# Patient Record
Sex: Male | Born: 1945 | Race: White | Hispanic: No | Marital: Married | Smoking: Never smoker
Health system: Southern US, Community
[De-identification: ages and names within clinical notes are randomized; demographics above are authoritative.]

## PROBLEM LIST (undated history)

## (undated) DIAGNOSIS — G4730 Sleep apnea, unspecified: Secondary | ICD-10-CM

## (undated) DIAGNOSIS — K219 Gastro-esophageal reflux disease without esophagitis: Secondary | ICD-10-CM

## (undated) DIAGNOSIS — S83206A Unspecified tear of unspecified meniscus, current injury, right knee, initial encounter: Secondary | ICD-10-CM

## (undated) DIAGNOSIS — I4819 Other persistent atrial fibrillation: Secondary | ICD-10-CM

## (undated) DIAGNOSIS — Z8719 Personal history of other diseases of the digestive system: Secondary | ICD-10-CM

## (undated) DIAGNOSIS — N329 Bladder disorder, unspecified: Secondary | ICD-10-CM

## (undated) DIAGNOSIS — J189 Pneumonia, unspecified organism: Secondary | ICD-10-CM

## (undated) DIAGNOSIS — E785 Hyperlipidemia, unspecified: Secondary | ICD-10-CM

## (undated) DIAGNOSIS — L309 Dermatitis, unspecified: Secondary | ICD-10-CM

## (undated) DIAGNOSIS — M1990 Unspecified osteoarthritis, unspecified site: Secondary | ICD-10-CM

## (undated) DIAGNOSIS — N400 Enlarged prostate without lower urinary tract symptoms: Secondary | ICD-10-CM

## (undated) DIAGNOSIS — R0602 Shortness of breath: Secondary | ICD-10-CM

## (undated) DIAGNOSIS — I499 Cardiac arrhythmia, unspecified: Secondary | ICD-10-CM

## (undated) HISTORY — DX: Sleep apnea, unspecified: G47.30

## (undated) HISTORY — DX: Other persistent atrial fibrillation: I48.19

## (undated) HISTORY — DX: Bladder disorder, unspecified: N32.9

## (undated) HISTORY — DX: Gastro-esophageal reflux disease without esophagitis: K21.9

## (undated) HISTORY — DX: Hyperlipidemia, unspecified: E78.5

---

## 1989-02-28 HISTORY — PX: CERVICAL FUSION: SHX112

## 1995-10-18 DIAGNOSIS — K219 Gastro-esophageal reflux disease without esophagitis: Secondary | ICD-10-CM | POA: Insufficient documentation

## 2000-10-18 ENCOUNTER — Encounter: Admission: RE | Admit: 2000-10-18 | Discharge: 2001-01-16 | Payer: Self-pay | Admitting: Family Medicine

## 2007-03-01 HISTORY — PX: OTHER SURGICAL HISTORY: SHX169

## 2007-03-15 ENCOUNTER — Encounter: Admission: RE | Admit: 2007-03-15 | Discharge: 2007-05-02 | Payer: Self-pay | Admitting: Family Medicine

## 2007-07-25 ENCOUNTER — Ambulatory Visit (HOSPITAL_BASED_OUTPATIENT_CLINIC_OR_DEPARTMENT_OTHER): Admission: RE | Admit: 2007-07-25 | Discharge: 2007-07-25 | Payer: Self-pay | Admitting: Orthopedic Surgery

## 2008-04-22 ENCOUNTER — Encounter: Admission: RE | Admit: 2008-04-22 | Discharge: 2008-04-22 | Payer: Self-pay | Admitting: Family Medicine

## 2008-05-07 ENCOUNTER — Encounter: Admission: RE | Admit: 2008-05-07 | Discharge: 2008-06-23 | Payer: Self-pay | Admitting: Sports Medicine

## 2010-03-05 IMAGING — CR DG ANKLE COMPLETE 3+V*R*
3 series · 3 of 3 positions shown · non-contrast
Comparison: Prior ankle radiographs from [REDACTED] at
[facility] dated 04/07/2008

CLINICAL DATA: Fall.  Ankle injury and pain.

RIGHT ANKLE - COMPLETE 3+ VIEW

[view not recorded (1 of 3)]
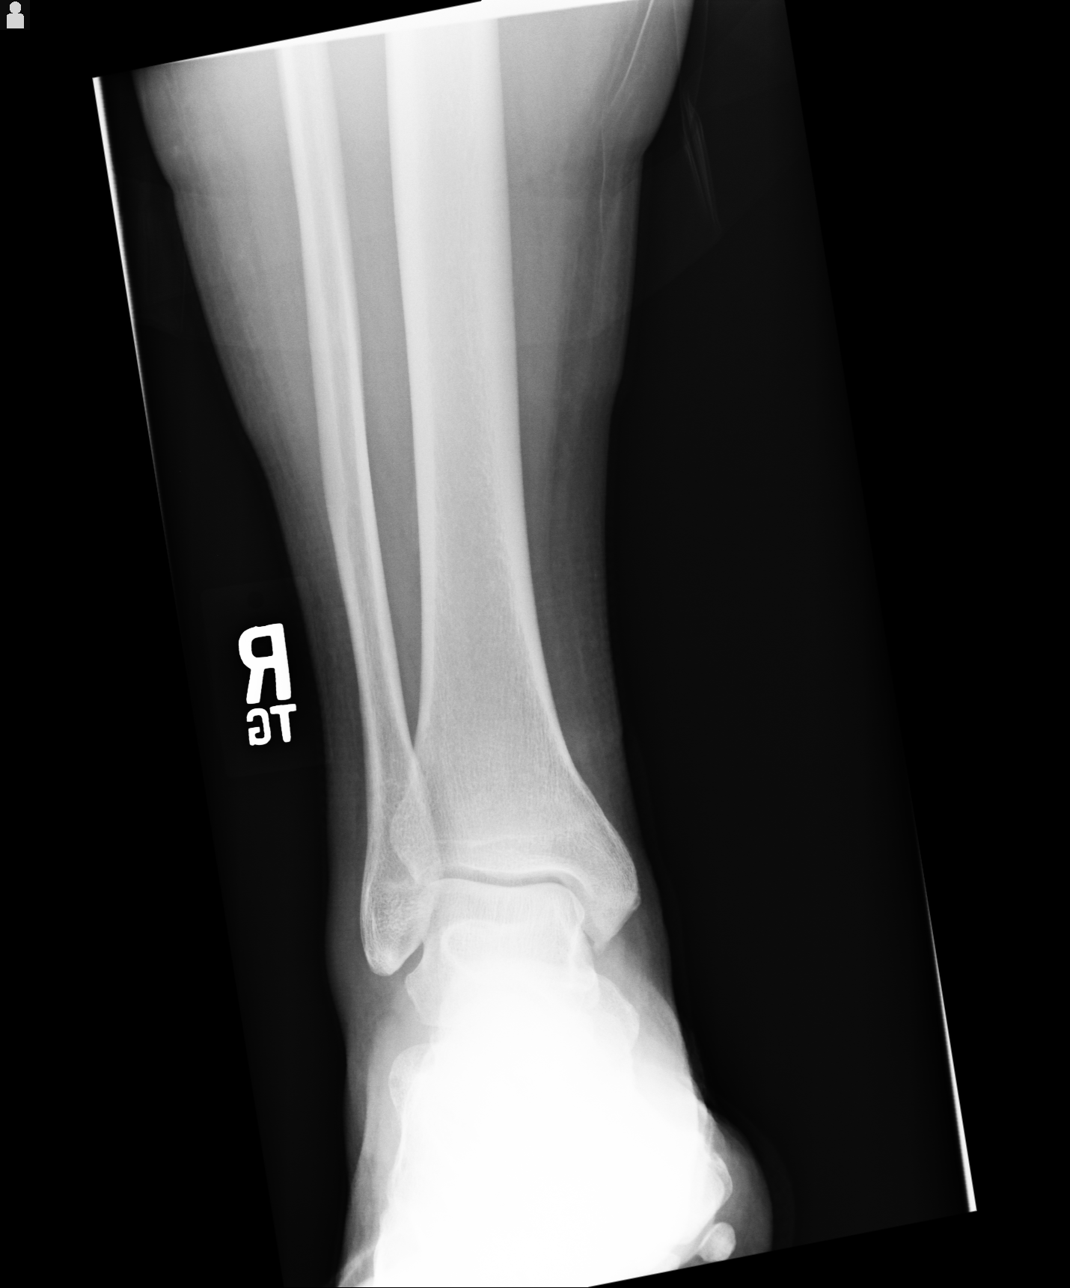

[view not recorded (2 of 3)]
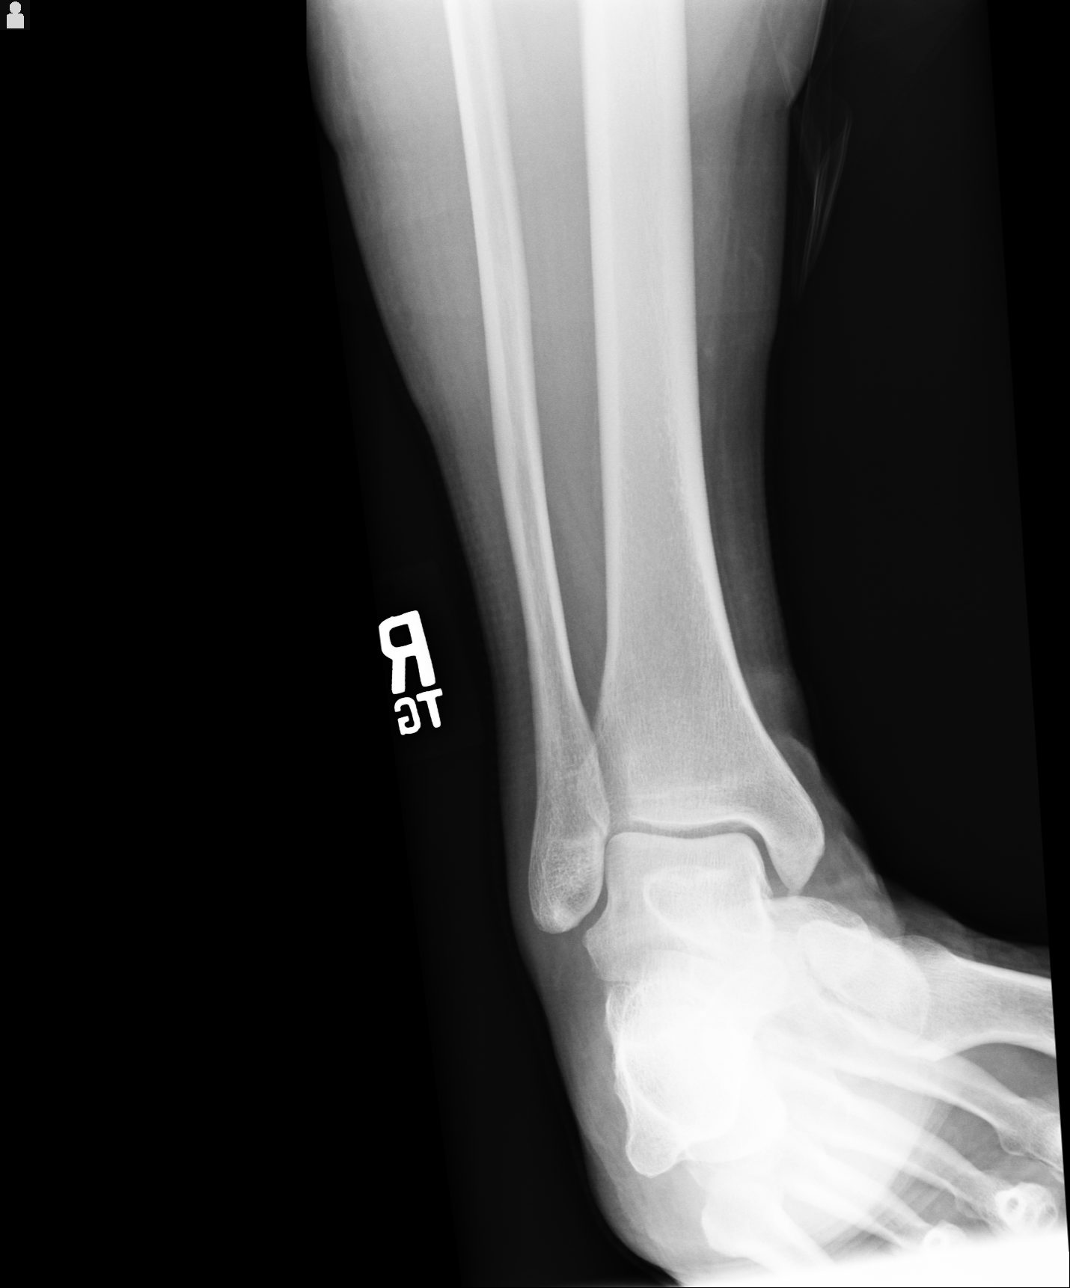

[view not recorded (3 of 3)]
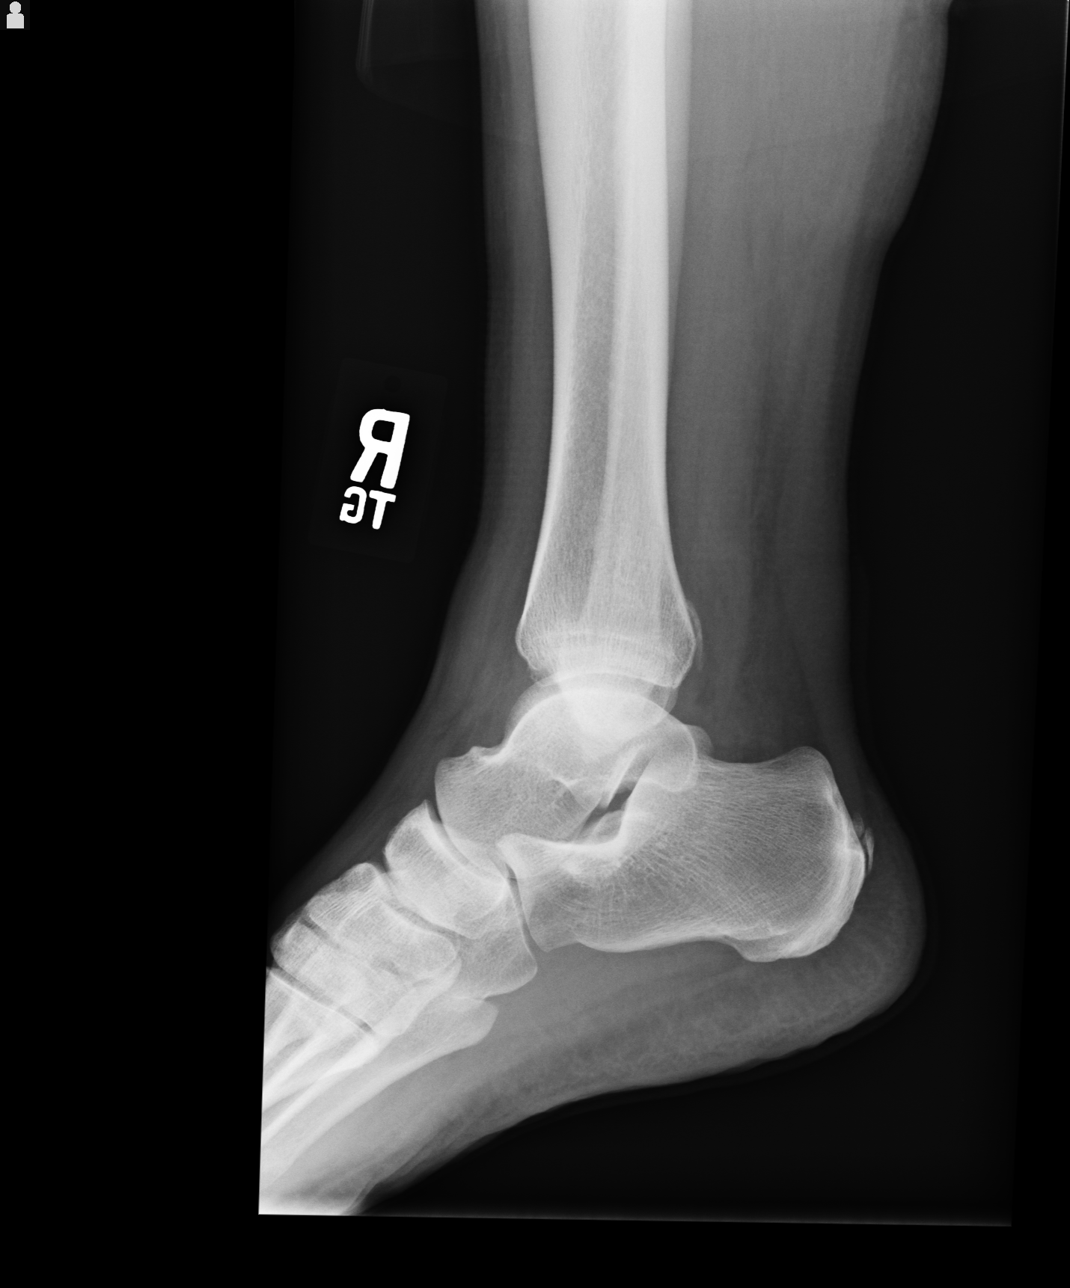

[3 of 3 positions shown; findings below may reference images not displayed]

FINDINGS: A linear radiodensity is seen along the posterior
malleolus of the distal tibia on the lateral projection, which
appears unchanged since prior study.  This is suspicious for
avulsion fracture from the posterior malleolus, but is of
indeterminate age radiographically.

No other ankle fractures are identified.  There is no evidence of
dislocation.  No other significant bone abnormality noted.  Soft
tissues are unremarkable in appearance.
IMPRESSION: Stable appearance of linear radiodensity adjacent to the posterior
malleolus on the lateral projection.  This is suspicious for an
avulsion fracture from the posterior malleolus, although it is of
indeterminate age radiographically.  Recommend clinical correlation
for point tenderness at this site.

## 2010-07-13 NOTE — Op Note (Signed)
[name], [name]             ACCOUNT NO.:  [id number]   MEDICAL RECORD NO.:  [id number]          PATIENT TYPE:  AMB   LOCATION:  [facility]                         FACILITY:  [facility]   PHYSICIAN:  Ollen Gross, M.D.    DATE OF BIRTH:  14-Jul-1945   DATE OF PROCEDURE:  07/25/2007  DATE OF DISCHARGE:                               OPERATIVE REPORT   PREOPERATIVE DIAGNOSIS:  Left knee medial meniscal tear.   POSTOPERATIVE DIAGNOSIS:  Left knee medial meniscal tear.   OPERATION PERFORMED:  Left knee arthroscopy with meniscal debridement.   SURGEON:  Ollen Gross, M.D.   ANESTHESIA:  General.   ESTIMATED BLOOD LOSS:  Minimal.   DRAINS:  None.   COMPLICATIONS:  None.   CONDITION:  Stable to recovery.   BRIEF CLINICAL NOTE:  Brian Myers is a 65 year old male with a several month  history of significant left knee pain and mechanical symptoms.  Exam and  history suggest a medial meniscal tear confirmed by MRI.  There is also  a questionable anterior horn lateral tear.  He presents now for  arthroscopy and debridement.   DESCRIPTION OF PROCEDURE:  After the successful administration of  general anesthetic, a tourniquet was placed high on the left thigh and  left lower extremity prepped and draped in the usual sterile fashion.  Standard superomedial and inferolateral incisions made and inflow  cannula passed superomedial, camera passed inferolateral.  Arthroscopic  visualization proceeds.  Undersurface of the patella and trochlea have  minimal chondromalacia with no significant defects.  The medial and  lateral gutters are visualized.  There are no loose bodies.  Flexion  valgus force was applied to the knee and the medial compartment was  entered.  There is significant undulation in the posterior horn and I  then used a spinal needle to localize the inferomedial portal and a  small incision was made and a dilator placed.  There is a tear on the  undersurface of the medial meniscus  posterior horn.  We debrided this  back to a stable base with baskets and a 4.2 mm shaver and sealed it off  with the ArthroCare.  When I debrided the tear, there was a fair amount  of degeneration in the posterior horn of the medial meniscus.  We got  debridement back to a stable, healthy-looking base.  There were no  chondral defects in the medial compartment.  The intercondylar notch was  visualized.  The ACL was normal.  The lateral compartment was entered.  There were no tears.  There were no chondral defects laterally.  We  again inspected the patellofemoral joint which looks normal.  I then  removed the arthroscopic equipment through the inferior portals which  were closed with interrupted 4-0 nylon.  20 mL of 0.25%  Marcaine with epinephrine were injected through the inflow cannula and  that is removed and that portal closed with nylon.  A bulky sterile  dressing was then applied and he was awakened and transported to  recovery in stable condition.      Ollen Gross, M.D.  Electronically Signed  FA/MEDQ  D:  07/25/2007  T:  07/25/2007  Job:  [id number]

## 2010-11-09 ENCOUNTER — Encounter: Payer: Self-pay | Admitting: Internal Medicine

## 2010-11-10 ENCOUNTER — Ambulatory Visit (INDEPENDENT_AMBULATORY_CARE_PROVIDER_SITE_OTHER): Payer: BC Managed Care – PPO | Admitting: Internal Medicine

## 2010-11-10 ENCOUNTER — Encounter: Payer: Self-pay | Admitting: Internal Medicine

## 2010-11-10 VITALS — BP 128/88 | HR 73 | Ht 71.0 in | Wt 255.0 lb

## 2010-11-10 DIAGNOSIS — E669 Obesity, unspecified: Secondary | ICD-10-CM | POA: Insufficient documentation

## 2010-11-10 DIAGNOSIS — G4730 Sleep apnea, unspecified: Secondary | ICD-10-CM

## 2010-11-10 DIAGNOSIS — Z9989 Dependence on other enabling machines and devices: Secondary | ICD-10-CM | POA: Insufficient documentation

## 2010-11-10 DIAGNOSIS — G4733 Obstructive sleep apnea (adult) (pediatric): Secondary | ICD-10-CM | POA: Insufficient documentation

## 2010-11-10 DIAGNOSIS — I4891 Unspecified atrial fibrillation: Secondary | ICD-10-CM | POA: Insufficient documentation

## 2010-11-10 MED ORDER — FLECAINIDE ACETATE 100 MG PO TABS: 100.0000 mg | ORAL_TABLET | Freq: Two times a day (BID) | ORAL | Status: DC

## 2010-11-10 NOTE — Progress Notes (Signed)
Brian Myers is a pleasant 65 y.o. WF patient with a h/o obesity, OSA on CPAP, and persistent atrial fibrillation who presents today for EP consultation regarding his atrial fibrillation.  He presented to Dr Zachery Dauer office 7/11 for further evaluation of palpitations and was diagnosed with atrial fibrillation.  In retrospect, he has had palpitations for 10 years or so.  He reports initially attempts to capture his palpitations years ago with holter monitoring were unsuccessful due to their infrequent nature.  Eventually his palpitations increased in frequency and duration of atrial fibrillation until the diagnosis was made.  He was subsequently evaluated by Dr Katrinka Blazing who initiated pradaxa and diltiazem.  He has not previously tried an AAD or cardioversion.  At this point, he feels that he is in afib 60% of the time or more.  He reports that episodes lasts up to 20 days at a time.  He reports symptoms of palpitations.  He reports fatigue, irritability, and decreased exercise tolerance during his afib. He is unaware of any triggers or precipitants for his afib. Today, he denies symptoms of chest pain, shortness of breath, orthopnea, PND, lower extremity edema, dizziness, presyncope, syncope, or neurologic sequela. The patient is tolerating medications without difficulties and is otherwise without complaint today.   Past Medical History  Diagnosis Date  . Asthma   . Hyperlipidemia   . Sleep apnea     with cpap  . GERD (gastroesophageal reflux disease)   . Persistent atrial fibrillation     Dr Katrinka Blazing   Past Surgical History  Procedure Date  . Total knee arthroplasty 2004  . Cervical fusion 1990    Current Outpatient Prescriptions  Medication Sig Dispense Refill  . albuterol (PROVENTIL HFA;VENTOLIN HFA) 108 (90 BASE) MCG/ACT inhaler Inhale 2 puffs into the lungs every 6 (six) hours as needed.        . budesonide (PULMICORT) 180 MCG/ACT inhaler Inhale 1 puff into the lungs 2 (two) times daily.         . dabigatran (PRADAXA) 150 MG CAPS Take 150 mg by mouth every 12 (twelve) hours.        Marland Kitchen diltiazem (CARDIZEM CD) 360 MG 24 hr capsule Take 360 mg by mouth daily.        Marland Kitchen esomeprazole (NEXIUM) 40 MG capsule Take 40 mg by mouth daily before breakfast.        . salmeterol (SEREVENT) 50 MCG/DOSE diskus inhaler Inhale 1 puff into the lungs 2 (two) times daily.          Allergies  Allergen Reactions  . Penicillins   . Sulfa Drugs Cross Reactors     History   Social History  . Marital Status: Married    Spouse Name: N/A    Number of Children: 2  . Years of Education: N/A   Occupational History  .  [employer]   Social History Main Topics  . Smoking status: Never Smoker   . Smokeless tobacco: Not on file  . Alcohol Use: 0.0 oz/week     at least 2-3 glasses of wine per day  . Drug Use: Not on file  . Sexually Active: Not on file   Other Topics Concern  . Not on file   Social History Narrative   Lives in [place] with spouse.  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    Family History  Problem Relation Age of Onset  . Arrhythmia Mother     atrial fib  . Hypertension Father   .  Mitral valve prolapse Mother     ROS- All systems are reviewed and negative except as per the HPI above  Physical Exam: Filed Vitals:   11/10/10 1624  BP: 128/88  Pulse: 73  Height: 5\' 11"  (1.803 m)  Weight: 255 lb (115.667 kg)    GEN- The patient is well appearing, alert and oriented x 3 today.   Head- normocephalic, atraumatic Eyes-  Sclera clear, conjunctiva pink Ears- hearing intact Oropharynx- clear Neck- supple, no JVP Lymph- no cervical lymphadenopathy Lungs- Clear to ausculation bilaterally, normal work of breathing Heart- irregular rate and rhythm, no murmurs, rubs or gallops, PMI not laterally displaced GI- soft, NT, ND, + BS Extremities- no clubbing, cyanosis, or edema MS- no significant deformity or atrophy Skin- no rash or lesion Psych- euthymic mood, full  affect Neuro- strength and sensation are intact  EKG today reveals afib V rate 73 bpm, nonspecific ST/T changes, Qtc 438  Assessment and Plan:

## 2010-11-10 NOTE — Assessment & Plan Note (Signed)
Weight loss advised 

## 2010-11-10 NOTE — Assessment & Plan Note (Signed)
He reports compliance with CPAP. 

## 2010-11-10 NOTE — Assessment & Plan Note (Signed)
Brian Myers has moderately symptomatic persistent atrial fibrillation.  He reports previously being told that he also had atrial flutter.   He has not previously tried antiarrhythmic drugs.  He is presently anticoagulated with pradaxa.  Therapeutic strategies for afib including medicine and ablation were discussed in detail with the patient today. At this time, he is not a candidate for catheter ablation as he has not failed antiarrhythmic drugs.  Given prolonged afib, obesity, OSA, and ongoing ETOH use, I suspect that his afib may be quite difficult to control long term. I will ask to Dr Katrinka Blazing to forward the patient's prior echo to me for review to determine his left atrial size.   The importance of lifestyle modification including weight loss, compliance with CPAP, and cessation of ETOH was discussed today.  In addition, I discussed risks and benefits to multaq, flecainide, and tikosyn.  At this time, the patient would like to try flecainide.  I have therefore started flecainide 100mg  BID today.  He will return in 2 weeks.  If he has not converted to sinus, he may require cardioversion.  Once in sinus, we will proceed with GXT myoview.  If he fails medical therapy with flecainide, then we would consider tikosyn.  I would like to achieve and maintain sinus rhythm to allow for atrial remodeling before considering catheter ablation.

## 2010-11-10 NOTE — Patient Instructions (Signed)
Your physician recommends that you schedule a follow-up appointment in: 2 weeks with Dr Johney Frame   Your physician has recommended you make the following change in your medication: start Flecainide 100mg  one tablet twice daily

## 2010-11-22 ENCOUNTER — Ambulatory Visit: Payer: BC Managed Care – PPO | Admitting: Internal Medicine

## 2010-11-24 LAB — POCT HEMOGLOBIN-HEMACUE
Hemoglobin: 15.5
Operator id: 280881

## 2010-12-06 ENCOUNTER — Encounter: Payer: Self-pay | Admitting: Internal Medicine

## 2011-01-13 ENCOUNTER — Encounter: Payer: Self-pay | Admitting: Internal Medicine

## 2011-06-09 ENCOUNTER — Other Ambulatory Visit: Payer: Self-pay | Admitting: Internal Medicine

## 2011-06-09 DIAGNOSIS — I4891 Unspecified atrial fibrillation: Secondary | ICD-10-CM

## 2011-06-09 MED ORDER — FLECAINIDE ACETATE 100 MG PO TABS: 100.0000 mg | ORAL_TABLET | Freq: Two times a day (BID) | ORAL | Status: DC

## 2011-07-06 ENCOUNTER — Other Ambulatory Visit: Payer: Self-pay | Admitting: Internal Medicine

## 2011-07-06 DIAGNOSIS — I4891 Unspecified atrial fibrillation: Secondary | ICD-10-CM

## 2011-07-06 MED ORDER — FLECAINIDE ACETATE 100 MG PO TABS: 100.0000 mg | ORAL_TABLET | Freq: Two times a day (BID) | ORAL | Status: DC

## 2011-08-03 ENCOUNTER — Other Ambulatory Visit: Payer: Self-pay | Admitting: Internal Medicine

## 2011-08-03 DIAGNOSIS — I4891 Unspecified atrial fibrillation: Secondary | ICD-10-CM

## 2011-08-03 MED ORDER — FLECAINIDE ACETATE 100 MG PO TABS: 100.0000 mg | ORAL_TABLET | Freq: Two times a day (BID) | ORAL | Status: DC

## 2013-03-05 ENCOUNTER — Other Ambulatory Visit: Payer: Self-pay | Admitting: Orthopedic Surgery

## 2013-03-20 ENCOUNTER — Telehealth: Payer: Self-pay

## 2013-03-20 NOTE — Telephone Encounter (Signed)
pt adv that Dr.Smith is clearinfg him for surgery.pt is to hold pradaxa 48hrs before surgery.pt is to follow Dr.Aluisio instructions on when to restart.Clearance given to medical records to fax.pt verbalized understanding.

## 2013-03-21 ENCOUNTER — Telehealth: Payer: Self-pay | Admitting: Interventional Cardiology

## 2013-03-21 NOTE — Telephone Encounter (Signed)
Received request from Nurse fax box, documents faxed for surgical clearance. To: [employer] Fax number: [phone] Attention: [phone]/[phone]

## 2013-03-24 ENCOUNTER — Other Ambulatory Visit: Payer: Self-pay | Admitting: *Deleted

## 2013-03-24 DIAGNOSIS — I4891 Unspecified atrial fibrillation: Secondary | ICD-10-CM

## 2013-04-04 ENCOUNTER — Other Ambulatory Visit: Payer: Self-pay | Admitting: Orthopedic Surgery

## 2013-04-10 ENCOUNTER — Telehealth: Payer: Self-pay | Admitting: Interventional Cardiology

## 2013-04-10 NOTE — Telephone Encounter (Signed)
New message  Patient feels that his A-Fib is acting up and he would like to speak with a nurse. Please call and advise.

## 2013-04-10 NOTE — Telephone Encounter (Signed)
Returned call to patient he stated he has had 4 to 5 episodes of atrial fib in the past week.Stated the last episode made him sob and weak.Stated he is scheduled for knee replacement next Wednesday 04/17/13 and would like appointment with Dr.Smith.Stated he is taking flecainide 100 mg twice a day and diltiazem 360 mg daily.Message sent to Dr.Smith's nurse Lattie Haw for a appointment.

## 2013-04-11 NOTE — Telephone Encounter (Signed)
Follow up    Pt has not heard from nurse.  Having knee surgery next wed and is in afib---want nurse to work him in to see Dr Tamala Julian before his surgery.

## 2013-04-12 ENCOUNTER — Encounter (HOSPITAL_COMMUNITY)
Admission: RE | Admit: 2013-04-12 | Discharge: 2013-04-12 | Disposition: A | Discharge status: Home / Self Care | Payer: BC Managed Care – PPO | Source: Ambulatory Visit | Attending: Orthopedic Surgery | Admitting: Orthopedic Surgery

## 2013-04-12 ENCOUNTER — Encounter (HOSPITAL_COMMUNITY): Payer: Self-pay

## 2013-04-12 ENCOUNTER — Encounter (HOSPITAL_COMMUNITY): Payer: BC Managed Care – PPO | Admitting: Pharmacy Technician

## 2013-04-12 DIAGNOSIS — Z01810 Encounter for preprocedural cardiovascular examination: Secondary | ICD-10-CM | POA: Insufficient documentation

## 2013-04-12 DIAGNOSIS — Z01812 Encounter for preprocedural laboratory examination: Secondary | ICD-10-CM | POA: Insufficient documentation

## 2013-04-12 HISTORY — DX: Pneumonia, unspecified organism: J18.9

## 2013-04-12 HISTORY — DX: Personal history of other diseases of the digestive system: Z87.19

## 2013-04-12 HISTORY — DX: Unspecified tear of unspecified meniscus, current injury, right knee, initial encounter: S83.206A

## 2013-04-12 HISTORY — DX: Unspecified osteoarthritis, unspecified site: M19.90

## 2013-04-12 HISTORY — DX: Benign prostatic hyperplasia without lower urinary tract symptoms: N40.0

## 2013-04-12 HISTORY — DX: Dermatitis, unspecified: L30.9

## 2013-04-12 HISTORY — DX: Shortness of breath: R06.02

## 2013-04-12 HISTORY — DX: Cardiac arrhythmia, unspecified: I49.9

## 2013-04-12 LAB — CBC
HCT: 38.2 % — ABNORMAL LOW (ref 39.0–52.0)
Hemoglobin: 12.2 g/dL — ABNORMAL LOW (ref 13.0–17.0)
MCH: 26.8 pg (ref 26.0–34.0)
MCHC: 31.9 g/dL (ref 30.0–36.0)
MCV: 83.8 fL (ref 78.0–100.0)
Platelets: 297 10*3/uL (ref 150–400)
RBC: 4.56 MIL/uL (ref 4.22–5.81)
RDW: 15.9 % — ABNORMAL HIGH (ref 11.5–15.5)
WBC: 8.3 10*3/uL (ref 4.0–10.5)

## 2013-04-12 LAB — BASIC METABOLIC PANEL
BUN: 13 mg/dL (ref 6–23)
CO2: 26 mEq/L (ref 19–32)
Calcium: 9.5 mg/dL (ref 8.4–10.5)
Chloride: 100 mEq/L (ref 96–112)
Creatinine, Ser: 0.97 mg/dL (ref 0.50–1.35)
GFR calc Af Amer: >90 mL/min (ref >90)
GFR calc non Af Amer: 85 mL/min — ABNORMAL LOW (ref >90)
Glucose, Bld: 117 mg/dL — ABNORMAL HIGH (ref 70–99)
Potassium: 3.7 mEq/L (ref 3.7–5.3)
Sodium: 140 mEq/L (ref 137–147)

## 2013-04-12 LAB — PROTIME-INR
INR: 1.04 (ref 0.00–1.49)
Prothrombin Time: 13.4 seconds (ref 11.6–15.2)

## 2013-04-12 LAB — APTT: aPTT: 33 seconds (ref 24–37)

## 2013-04-12 NOTE — Pre-Procedure Instructions (Signed)
CXR REPORT 03/20/13 ON PT'S CHART FROM [facility] AT [facility]. EKG AND CARDIOLOGY OFFICE NOTES 07/10/13 ON PT'S CHART FROM DR. Linard Millers.  PT STATES HE HAS HX ATRIAL FIB AND FLUTTER - AND STATES HIS MEDICATION USUALLY CONTROLS HEART RATE BUT HE HAS BEEN EXPERIENCING EPISODES OF  PALPITATIONS AND BREATHLESSNESS AND HAS LEFT MESSAGE FOR DR. H. [place] DR'S OFFICE TO CALL HIM BACK.  EKG REPEATED TODAY AT [facility] - NS[facility].

## 2013-04-12 NOTE — Patient Instructions (Signed)
   YOUR SURGERY IS SCHEDULED AT [facility]  ON:  Wednesday  2/18  REPORT TO  SHORT STAY CENTER AT:   8:00 AM      PHONE # FOR SHORT STAY IS [phone]  DO NOT EAT OR DRINK ANYTHING AFTER MIDNIGHT THE NIGHT BEFORE YOUR SURGERY.  YOU MAY BRUSH YOUR TEETH, RINSE OUT YOUR MOUTH--BUT NO WATER, NO FOOD, NO CHEWING GUM, NO MINTS, NO CANDIES, NO CHEWING TOBACCO.  PLEASE TAKE THE FOLLOWING MEDICATIONS THE AM OF YOUR SURGERY WITH A FEW SIPS OF WATER:   FLECAINIDE.  USE YOUR SEREVENT, PULMICORT AND ALBUTEROL INHALERS.  BRING YOUR ALBUTEROL INHALER TO HOSPITAL   IF YOU HAVE SLEEP APNEA AND USE CPAP OR BIPAP--PLEASE BRING THE MASK AND THE TUBING.  DO NOT BRING YOUR MACHINE.  DO NOT BRING VALUABLES, MONEY, CREDIT CARDS.  DO NOT WEAR JEWELRY, MAKE-UP, NAIL POLISH AND NO METAL PINS OR CLIPS IN YOUR HAIR. CONTACT LENS, DENTURES / PARTIALS, GLASSES SHOULD NOT BE WORN TO SURGERY AND IN MOST CASES-HEARING AIDS WILL NEED TO BE REMOVED.  BRING YOUR GLASSES CASE, ANY EQUIPMENT NEEDED FOR YOUR CONTACT LENS. FOR PATIENTS ADMITTED TO THE HOSPITAL--CHECK OUT TIME THE DAY OF DISCHARGE IS 11:00 AM.  ALL INPATIENT ROOMS ARE PRIVATE - WITH BATHROOM, TELEPHONE, TELEVISION AND WIFI INTERNET.  IF YOU ARE BEING DISCHARGED THE SAME DAY OF YOUR SURGERY--YOU CAN NOT DRIVE YOURSELF HOME--AND SHOULD NOT GO HOME ALONE BY TAXI OR BUS.  NO DRIVING OR OPERATING MACHINERY, OR MAKING LEGAL DECISIONS FOR 24 HOURS FOLLOWING ANESTHESIA / PAIN MEDICATIONS.  PLEASE MAKE ARRANGEMENTS FOR SOMEONE TO BE WITH YOU AT HOME THE FIRST 24 HOURS AFTER SURGERY. RESPONSIBLE DRIVER'S NAME / PHONE  WIFE    [place] ANY  FACT SHEETS THAT YOU WERE GIVEN:  INCENTIVE SPIROMETER INFORMATION.  FAILURE TO FOLLOW THESE INSTRUCTIONS MAY RESULT IN THE CANCELLATION OF YOUR SURGERY. PLEASE BE AWARE THAT YOU MAY NEED ADDITIONAL BLOOD DRAWN DAY OF YOUR SURGERY  PATIENT  SIGNATURE_________________________________

## 2013-04-12 NOTE — Telephone Encounter (Signed)
Follow up    Pt need to speak with a nurse regarding knee surgery and is in afib--he is supposed to stop prodaxa Monday if Dr Tamala Julian says so

## 2013-04-12 NOTE — Telephone Encounter (Signed)
Spoke with Dr. Julianne Handler (DOD) this patient.  He requests I contact Dr. Tamala Julian directly to discuss.    Spoke with patient.  He had been having symptomatic episodes of afib, noting that they lasted about 10-15 min at a time.  Has felt none for the last 3 days.   However, the last time (3 days ago) he was very SOB for 9-10 hrs with minimal exertion. "like I was before I started flecainide".  He is scheduled to have knee surgery on 2/18 (Wed) and is supposed to hold pradaxa starting Monday. He had pre op EKG today and it appears NSR.  Called Dr. Tamala Julian on cell but he was unable to talk, asked him to pls call back to triage. CB from Dr. Tamala Julian, ok for patient to be off pradaxa starting Monday as scheduled for surgery.   Called patient back again.  Informed of above.

## 2013-04-13 NOTE — Telephone Encounter (Signed)
Past Medical History  Asthma   GERD   Sleep apnea with CPAP (Dr. Brett Fairy)   Hyeprlipidemia   intermittant Afib (Dr. Tamala Julian, Dr. Rayann Heman)   BPH, elevated PSA (Dr. Alinda Money   Only has intermittent AF and brief interruption of anticoagulation will increase of stroke minimally. He should proceed with plan and discontinue pradaxa 48-72 hours prior to the procedure.

## 2013-04-16 NOTE — H&P (Signed)
CC- Brian Myers is a 68 y.o. male who presents with right knee pain.  HPI- . Knee Pain: Patient presents with knee pain involving the  right knee. Onset of the symptoms was several months ago. Inciting event: none known. Current symptoms include giving out, pain located medially, popping sensation, stiffness and swelling. Pain is aggravated by lateral movements, pivoting, rising after sitting, squatting and walking.  Patient has had no prior knee problems. Evaluation to date: MRI: abnormal medial and lateral meniscal tears. Treatment to date: corticosteroid injection which was not very effective.  Past Medical History  Diagnosis Date  . Asthma   . Hyperlipidemia   . Sleep apnea     with cpap  . GERD (gastroesophageal reflux disease)   . Persistent atrial fibrillation     Dr Tamala Julian  . Shortness of breath     WHEN HEART BEAT IRREGULAR  . Pneumonia     LAST EPISODE JAN 2015  . Arthritis     KNEES  . Right knee meniscal tear     PAIN RT KNEE  . BPH (benign prostatic hypertrophy)     DIFFICULTY GETTING STREAM STARTED  . Eczema     HANDS  . Hyperlipidemia     DOES NOT TOLERATE ANY OF THE STATINS  . H/O hiatal hernia   . Dysrhythmia     ATRIAL FIB AND FLUTTER-DR. Linard Millers PT STATES HIS HEART IS USUALLY IN RHYTHM - BUT FOR PAST 10 DAYS OR MORE - HE HAS EXPERIENCED PALPITATIONS AND BREATHLESSNESS - HE HAS CALLED DR. Lemmie Evens SMITH'S OFFICE     Past Surgical History  Procedure Laterality Date  . Cervical fusion  1991  . Left knee arthroscpy  2009    Prior to Admission medications   Medication Sig Start Date End Date Taking? Authorizing Provider  acidophilus (RISAQUAD) CAPS capsule Take 1 capsule by mouth daily.    Historical Provider, MD  albuterol (PROVENTIL HFA;VENTOLIN HFA) 108 (90 BASE) MCG/ACT inhaler Inhale 2 puffs into the lungs every 6 (six) hours as needed for wheezing or shortness of breath.     Historical Provider, MD  augmented betamethasone dipropionate (DIPROLENE-AF)  0.05 % ointment Apply 1 application topically daily. Uses on hands for eczema    Historical Provider, MD  budesonide (PULMICORT) 180 MCG/ACT inhaler Inhale 1 puff into the lungs 2 (two) times daily.     Historical Provider, MD  clobetasol cream (TEMOVATE) 4.00 % Apply 1 application topically 3 (three) times daily as needed (rash).    Historical Provider, MD  dabigatran (PRADAXA) 150 MG CAPS Take 150 mg by mouth every 12 (twelve) hours.     Historical Provider, MD  diltiazem (CARDIZEM CD) 360 MG 24 hr capsule Take 360 mg by mouth every evening.     Historical Provider, MD  esomeprazole (NEXIUM) 40 MG capsule Take 40 mg by mouth every evening.     Historical Provider, MD  flecainide (TAMBOCOR) 100 MG tablet Take 100 mg by mouth 2 (two) times daily.    Historical Provider, MD  methylcellulose (ARTIFICIAL TEARS) 1 % ophthalmic solution Place 1 drop into both eyes 2 (two) times daily as needed (dry eyes).    Historical Provider, MD  salmeterol (SEREVENT) 50 MCG/DOSE diskus inhaler Inhale 1 puff into the lungs 2 (two) times daily.     Historical Provider, MD   Knee Exam antalgic gait, soft tissue tenderness over medial joint line, effusion, negative pivot-shift, collateral ligaments intact  Physical Examination: General appearance - alert, well  appearing, and in no distress Mental status - alert, oriented to person, place, and time Chest - clear to auscultation, no wheezes, rales or rhonchi, symmetric air entry Heart - normal rate, regular rhythm, normal S1, S2, no murmurs, rubs, clicks or gallops Abdomen - soft, nontender, nondistended, no masses or organomegaly Neurological - alert, oriented, normal speech, no focal findings or movement disorder noted   Asessment/Plan--- Right knee medial and lateral meniscal tears- - Plan right knee arthroscopy with meniscal debridement. Procedure risks and potential comps discussed with patient who elects to proceed. Goals are decreased pain and increased function  with a high likelihood of achieving both

## 2013-04-17 ENCOUNTER — Encounter (HOSPITAL_COMMUNITY): Payer: Self-pay | Admitting: *Deleted

## 2013-04-17 ENCOUNTER — Encounter (HOSPITAL_COMMUNITY): Payer: BC Managed Care – PPO | Admitting: Anesthesiology

## 2013-04-17 ENCOUNTER — Ambulatory Visit (HOSPITAL_COMMUNITY)
Admission: RE | Admit: 2013-04-17 | Discharge: 2013-04-17 | Disposition: A | Discharge status: Home / Self Care | Payer: BC Managed Care – PPO | Source: Ambulatory Visit | Attending: Orthopedic Surgery | Admitting: Orthopedic Surgery

## 2013-04-17 ENCOUNTER — Ambulatory Visit (HOSPITAL_COMMUNITY): Payer: BC Managed Care – PPO | Admitting: Anesthesiology

## 2013-04-17 ENCOUNTER — Encounter (HOSPITAL_COMMUNITY)
Admission: RE | Disposition: A | Discharge status: Home / Self Care | Payer: Self-pay | Source: Ambulatory Visit | Attending: Orthopedic Surgery

## 2013-04-17 DIAGNOSIS — G4730 Sleep apnea, unspecified: Secondary | ICD-10-CM | POA: Insufficient documentation

## 2013-04-17 DIAGNOSIS — IMO0002 Reserved for concepts with insufficient information to code with codable children: Secondary | ICD-10-CM | POA: Insufficient documentation

## 2013-04-17 DIAGNOSIS — S83289A Other tear of lateral meniscus, current injury, unspecified knee, initial encounter: Secondary | ICD-10-CM | POA: Insufficient documentation

## 2013-04-17 DIAGNOSIS — X58XXXA Exposure to other specified factors, initial encounter: Secondary | ICD-10-CM | POA: Insufficient documentation

## 2013-04-17 DIAGNOSIS — K219 Gastro-esophageal reflux disease without esophagitis: Secondary | ICD-10-CM | POA: Insufficient documentation

## 2013-04-17 DIAGNOSIS — E785 Hyperlipidemia, unspecified: Secondary | ICD-10-CM | POA: Insufficient documentation

## 2013-04-17 DIAGNOSIS — J45909 Unspecified asthma, uncomplicated: Secondary | ICD-10-CM | POA: Insufficient documentation

## 2013-04-17 DIAGNOSIS — N400 Enlarged prostate without lower urinary tract symptoms: Secondary | ICD-10-CM | POA: Insufficient documentation

## 2013-04-17 DIAGNOSIS — I4891 Unspecified atrial fibrillation: Secondary | ICD-10-CM | POA: Insufficient documentation

## 2013-04-17 DIAGNOSIS — K449 Diaphragmatic hernia without obstruction or gangrene: Secondary | ICD-10-CM | POA: Insufficient documentation

## 2013-04-17 DIAGNOSIS — M2240 Chondromalacia patellae, unspecified knee: Secondary | ICD-10-CM | POA: Insufficient documentation

## 2013-04-17 DIAGNOSIS — Z79899 Other long term (current) drug therapy: Secondary | ICD-10-CM | POA: Insufficient documentation

## 2013-04-17 DIAGNOSIS — M1710 Unilateral primary osteoarthritis, unspecified knee: Secondary | ICD-10-CM | POA: Insufficient documentation

## 2013-04-17 DIAGNOSIS — S83249A Other tear of medial meniscus, current injury, unspecified knee, initial encounter: Secondary | ICD-10-CM | POA: Diagnosis present

## 2013-04-17 DIAGNOSIS — Z981 Arthrodesis status: Secondary | ICD-10-CM | POA: Insufficient documentation

## 2013-04-17 HISTORY — PX: KNEE ARTHROSCOPY: SHX127

## 2013-04-17 SURGERY — ARTHROSCOPY, KNEE
Anesthesia: General | Site: Knee | Laterality: Right

## 2013-04-17 MED ORDER — VANCOMYCIN HCL IN DEXTROSE 1-5 GM/200ML-% IV SOLN
INTRAVENOUS | Status: AC
  Filled 2013-04-17: qty 200

## 2013-04-17 MED ORDER — CHLORHEXIDINE GLUCONATE 4 % EX LIQD: 60.0000 mL | Freq: Once | CUTANEOUS | Status: DC

## 2013-04-17 MED ORDER — PROPOFOL 10 MG/ML IV BOLUS
INTRAVENOUS | Status: AC
  Filled 2013-04-17: qty 20

## 2013-04-17 MED ORDER — SODIUM CHLORIDE 0.9 % IV SOLN: INTRAVENOUS | Status: DC

## 2013-04-17 MED ORDER — VANCOMYCIN HCL IN DEXTROSE 1-5 GM/200ML-% IV SOLN: 1000.0000 mg | Freq: Once | INTRAVENOUS | Status: DC

## 2013-04-17 MED ORDER — PROPOFOL 10 MG/ML IV BOLUS
INTRAVENOUS | Status: DC | PRN
  Administered 2013-04-17: 200 mg via INTRAVENOUS

## 2013-04-17 MED ORDER — BUPIVACAINE-EPINEPHRINE 0.25% -1:200000 IJ SOLN
INTRAMUSCULAR | Status: DC | PRN
  Administered 2013-04-17: 20 mL

## 2013-04-17 MED ORDER — HYDROCODONE-ACETAMINOPHEN 5-325 MG PO TABS: 1.0000 | ORAL_TABLET | Freq: Four times a day (QID) | ORAL | Status: DC | PRN

## 2013-04-17 MED ORDER — FENTANYL CITRATE 0.05 MG/ML IJ SOLN
INTRAMUSCULAR | Status: DC | PRN
  Administered 2013-04-17 (×2): 50 ug via INTRAVENOUS

## 2013-04-17 MED ORDER — LACTATED RINGERS IV SOLN: INTRAVENOUS | Status: DC

## 2013-04-17 MED ORDER — DEXAMETHASONE SODIUM PHOSPHATE 10 MG/ML IJ SOLN
INTRAMUSCULAR | Status: AC
  Filled 2013-04-17: qty 1

## 2013-04-17 MED ORDER — KETOROLAC TROMETHAMINE 30 MG/ML IJ SOLN
INTRAMUSCULAR | Status: AC
  Filled 2013-04-17: qty 1

## 2013-04-17 MED ORDER — PROMETHAZINE HCL 25 MG/ML IJ SOLN: 6.2500 mg | INTRAMUSCULAR | Status: DC | PRN

## 2013-04-17 MED ORDER — MIDAZOLAM HCL 5 MG/5ML IJ SOLN
INTRAMUSCULAR | Status: DC | PRN
  Administered 2013-04-17: 2 mg via INTRAVENOUS

## 2013-04-17 MED ORDER — METHOCARBAMOL 500 MG PO TABS: 500.0000 mg | ORAL_TABLET | Freq: Four times a day (QID) | ORAL | Status: DC

## 2013-04-17 MED ORDER — ONDANSETRON HCL 4 MG/2ML IJ SOLN
INTRAMUSCULAR | Status: AC
  Filled 2013-04-17: qty 2

## 2013-04-17 MED ORDER — DEXAMETHASONE SODIUM PHOSPHATE 10 MG/ML IJ SOLN
10.0000 mg | Freq: Once | INTRAMUSCULAR | Status: AC
  Administered 2013-04-17: 10 mg via INTRAVENOUS

## 2013-04-17 MED ORDER — ONDANSETRON HCL 4 MG/2ML IJ SOLN
INTRAMUSCULAR | Status: DC | PRN
  Administered 2013-04-17: 4 mg via INTRAVENOUS

## 2013-04-17 MED ORDER — LACTATED RINGERS IR SOLN
Status: DC | PRN
  Administered 2013-04-17: 8000 mL

## 2013-04-17 MED ORDER — MIDAZOLAM HCL 2 MG/2ML IJ SOLN
INTRAMUSCULAR | Status: AC
  Filled 2013-04-17: qty 2

## 2013-04-17 MED ORDER — FENTANYL CITRATE 0.05 MG/ML IJ SOLN: 25.0000 ug | INTRAMUSCULAR | Status: DC | PRN

## 2013-04-17 MED ORDER — LIDOCAINE HCL (CARDIAC) 20 MG/ML IV SOLN
INTRAVENOUS | Status: DC | PRN
  Administered 2013-04-17: 50 mg via INTRAVENOUS

## 2013-04-17 MED ORDER — FENTANYL CITRATE 0.05 MG/ML IJ SOLN
INTRAMUSCULAR | Status: AC
  Filled 2013-04-17: qty 2

## 2013-04-17 MED ORDER — BUPIVACAINE-EPINEPHRINE 0.25% -1:200000 IJ SOLN
INTRAMUSCULAR | Status: AC
  Filled 2013-04-17: qty 1

## 2013-04-17 MED ORDER — VANCOMYCIN HCL IN DEXTROSE 1-5 GM/200ML-% IV SOLN
1000.0000 mg | INTRAVENOUS | Status: AC
  Administered 2013-04-17: 1000 mg via INTRAVENOUS

## 2013-04-17 MED ORDER — KETOROLAC TROMETHAMINE 30 MG/ML IJ SOLN
15.0000 mg | Freq: Once | INTRAMUSCULAR | Status: AC | PRN
  Administered 2013-04-17: 30 mg via INTRAVENOUS

## 2013-04-17 MED ORDER — LIDOCAINE HCL (CARDIAC) 20 MG/ML IV SOLN
INTRAVENOUS | Status: AC
  Filled 2013-04-17: qty 5

## 2013-04-17 MED ORDER — ACETAMINOPHEN 10 MG/ML IV SOLN
1000.0000 mg | Freq: Once | INTRAVENOUS | Status: AC
  Administered 2013-04-17: 1000 mg via INTRAVENOUS
  Filled 2013-04-17: qty 100

## 2013-04-17 MED ORDER — LACTATED RINGERS IV SOLN
INTRAVENOUS | Status: DC | PRN
  Administered 2013-04-17: 09:00:00 via INTRAVENOUS

## 2013-04-17 SURGICAL SUPPLY — 25 items
BLADE 4.2CUDA (BLADE) ×2 IMPLANT
BNDG CMPR MED 10X6 ELC LF (GAUZE/BANDAGES/DRESSINGS) ×1
BNDG ELASTIC 6X10 VLCR STRL LF (GAUZE/BANDAGES/DRESSINGS) ×1 IMPLANT
CLOTH BEACON ORANGE TIMEOUT ST (SAFETY) ×2 IMPLANT
COUNTER NEEDLE 20 DBL MAG RED (NEEDLE) ×1 IMPLANT
CUFF TOURN SGL QUICK 34 (TOURNIQUET CUFF) ×2
CUFF TRNQT CYL 34X4X40X1 (TOURNIQUET CUFF) ×1 IMPLANT
DRAPE U-SHAPE 47X51 STRL (DRAPES) ×2 IMPLANT
DRSG EMULSION OIL 3X3 NADH (GAUZE/BANDAGES/DRESSINGS) ×2 IMPLANT
DURAPREP 26ML APPLICATOR (WOUND CARE) ×2 IMPLANT
GLOVE BIO SURGEON STRL SZ8 (GLOVE) ×2 IMPLANT
GLOVE BIOGEL PI IND STRL 8 (GLOVE) ×1 IMPLANT
GLOVE BIOGEL PI INDICATOR 8 (GLOVE) ×1
GOWN STRL REUS W/TWL LRG LVL3 (GOWN DISPOSABLE) ×2 IMPLANT
MANIFOLD NEPTUNE II (INSTRUMENTS) ×2 IMPLANT
PACK ARTHROSCOPY WL (CUSTOM PROCEDURE TRAY) ×2 IMPLANT
PACK ICE MAXI GEL EZY WRAP (MISCELLANEOUS) ×6 IMPLANT
PADDING CAST COTTON 6X4 STRL (CAST SUPPLIES) ×3 IMPLANT
POSITIONER SURGICAL ARM (MISCELLANEOUS) ×2 IMPLANT
SET ARTHROSCOPY TUBING (MISCELLANEOUS) ×2
SET ARTHROSCOPY TUBING LN (MISCELLANEOUS) ×1 IMPLANT
SUT ETHILON 4 0 PS 2 18 (SUTURE) ×2 IMPLANT
TOWEL OR 17X26 10 PK STRL BLUE (TOWEL DISPOSABLE) ×2 IMPLANT
WAND 90 DEG TURBOVAC W/CORD (SURGICAL WAND) IMPLANT
WRAP KNEE MAXI GEL POST OP (GAUZE/BANDAGES/DRESSINGS) ×2 IMPLANT

## 2013-04-17 NOTE — Discharge Instructions (Signed)

## 2013-04-17 NOTE — Transfer of Care (Signed)
Immediate Anesthesia Transfer of Care Note  Patient: Brian Myers  Procedure(s) Performed: Procedure(s) (LRB): ARTHROSCOPY RIGHT KNEE WITH MEDIAL AND LATERAL DEBRIDEMENT AND CONDRAPLASTY (Right)  Patient Location: PACU  Anesthesia Type: General  Level of Consciousness: sedated, patient cooperative and responds to stimulation  Airway & Oxygen Therapy: Patient Spontanous Breathing and Patient connected to face mask oxgen  Post-op Assessment: Report given to PACU RN and Post -op Vital signs reviewed and stable  Post vital signs: Reviewed and stable  Complications: No apparent anesthesia complications

## 2013-04-17 NOTE — Interval H&P Note (Signed)
History and Physical Interval Note:  04/17/2013 10:21 AM  Brian Myers  has presented today for surgery, with the diagnosis of Right Knee Medial Mensical Tear  The various methods of treatment have been discussed with the patient and family. After consideration of risks, benefits and other options for treatment, the patient has consented to  Procedure(s): ARTHROSCOPY RIGHT KNEE WITH DEBRIDEMENT (Right) as a surgical intervention .  The patient's history has been reviewed, patient examined, no change in status, stable for surgery.  I have reviewed the patient's chart and labs.  Questions were answered to the patient's satisfaction.     Gearlean Alf

## 2013-04-17 NOTE — Preoperative (Signed)
Beta Blockers   Reason not to administer Beta Blockers:Not Applicable 

## 2013-04-17 NOTE — Anesthesia Preprocedure Evaluation (Signed)
Anesthesia Evaluation  Patient identified by MRN, date of birth, ID band Patient awake    Reviewed: Allergy & Precautions, H&P , NPO status , Patient's Chart, lab work & pertinent test results  Airway Mallampati: III TM Distance: <3 FB Neck ROM: Full    Dental no notable dental hx.    Pulmonary sleep apnea and Continuous Positive Airway Pressure Ventilation ,  breath sounds clear to auscultation  + decreased breath sounds      Cardiovascular negative cardio ROS  + dysrhythmias Atrial Fibrillation Rhythm:Regular Rate:Normal     Neuro/Psych negative neurological ROS  negative psych ROS   GI/Hepatic negative GI ROS, Neg liver ROS,   Endo/Other  Morbid obesity  Renal/GU negative Renal ROS  negative genitourinary   Musculoskeletal negative musculoskeletal ROS (+)   Abdominal   Peds negative pediatric ROS (+)  Hematology negative hematology ROS (+)   Anesthesia Other Findings   Reproductive/Obstetrics negative OB ROS                           Anesthesia Physical Anesthesia Plan  ASA: III  Anesthesia Plan: General   Post-op Pain Management:    Induction: Intravenous  Airway Management Planned: LMA  Additional Equipment:   Intra-op Plan:   Post-operative Plan: Extubation in OR  Informed Consent: I have reviewed the patients History and Physical, chart, labs and discussed the procedure including the risks, benefits and alternatives for the proposed anesthesia with the patient or authorized representative who has indicated his/her understanding and acceptance.   Dental advisory given  Plan Discussed with: CRNA and Surgeon  Anesthesia Plan Comments:         Anesthesia Quick Evaluation

## 2013-04-17 NOTE — Op Note (Signed)
Preoperative diagnosis-  Right knee medial meniscal tear  Postoperative diagnosis Right- knee medial and lateral meniscal tears   plus Right medial femoral chondral defect  Procedure- Right knee arthroscopy with medial and lateral meniscal debridement and chondroplasty   Surgeon- Dione Plover. Catherine Oak, MD  Anesthesia-General  EBL-  minimal Complications- None  Condition- PACU - hemodynamically stable.  Brief clinical note- -Brian Myers is a 68 y.o.  male with a several month history of right knee pain and mechanical symptoms. Exam and history suggested medial meniscal tear confirmed by MRI. The patient presents now for arthroscopy and debridement   Procedure in detail -       After successful administration of General anesthetic, a tourmiquet is placed high on the Right  thigh and the Right lower extremity is prepped and draped in the usual sterile fashion. Time out is performed by the surgical team. Standard superomedial and inferolateral portal sites are marked and incisions made with an 11 blade. The inflow cannula is passed through the superomedial portal and camera through the inferolateral portal and inflow is initiated. Arthroscopic visualization proceeds.      The undersurface of the patella and trochlea are visualized and there is mild chondromalacia. The medial and lateral gutters are visualized and there are  no loose bodies. Flexion and valgus force is applied to the knee and the medial compartment is entered. A spinal needle is passed into the joint through the site marked for the inferomedial portal. A small incision is made and the dilator passed into the joint. The findings for the medial compartment are degenerative tear body and posterior horn medial meniscus and 1 x 2 cm chondral defect medial femoral condyle . The tear is debrided to a stable base with baskets and a shaver and sealed off with the Arthrocare. The shaver is used to debride the unstable cartilage to a stable  bony base with stable edges. It is probed and found to be stable.    The intercondylar notch is visualized and the ACL appears normal. The lateral compartment is entered and the findings are tear of body and anterior horn lateral meniscus . The tear is debrided to a stable base with baskets and a shaver and sealed off with the Arthrocare. There are no chondral defects in the lateral compartment.     The joint is again inspected and there are no other tears, defects or loose bodies identified. The arthroscopic equipment is then removed from the inferior portals which are closed with interrupted 4-0 nylon. 20 ml of .25% Marcaine with epinephrine are injected through the inflow cannula and the cannula is then removed and the portal closed with nylon. The incisions are cleaned and dried and a bulky sterile dressing is applied. The patient is then awakened and transported to recovery in stable condition.   04/17/2013, 11:21 AM

## 2013-04-17 NOTE — Anesthesia Postprocedure Evaluation (Signed)
  Anesthesia Post-op Note  Patient: Brian Myers  Procedure(s) Performed: Procedure(s) (LRB): ARTHROSCOPY RIGHT KNEE WITH MEDIAL AND LATERAL DEBRIDEMENT AND CONDRAPLASTY (Right)  Patient Location: PACU  Anesthesia Type: General  Level of Consciousness: awake and alert   Airway and Oxygen Therapy: Patient Spontanous Breathing  Post-op Pain: mild  Post-op Assessment: Post-op Vital signs reviewed, Patient's Cardiovascular Status Stable, Respiratory Function Stable, Patent Airway and No signs of Nausea or vomiting  Last Vitals:  Filed Vitals:   04/17/13 1129  BP: 125/71  Pulse:   Temp: 36.7 C  Resp: 12    Post-op Vital Signs: stable   Complications: No apparent anesthesia complications

## 2013-04-18 ENCOUNTER — Encounter (HOSPITAL_COMMUNITY): Payer: Self-pay | Admitting: Orthopedic Surgery

## 2013-05-02 ENCOUNTER — Other Ambulatory Visit: Payer: BC Managed Care – PPO

## 2013-05-24 ENCOUNTER — Encounter: Payer: Self-pay | Admitting: Interventional Cardiology

## 2013-05-31 ENCOUNTER — Telehealth: Payer: Self-pay | Admitting: Interventional Cardiology

## 2013-05-31 NOTE — Telephone Encounter (Signed)
New message     Refill prodaxa 150mg   #60 bid------pt has not seen Dr Tamala Julian at the new office

## 2013-05-31 NOTE — Telephone Encounter (Signed)
[facility] called for refills on Pradaxa 150 mg one tablet by mouth every 12 hours dispense 60 tablet and 2 refills.Pt has a yearly F/U appointment with Dr. Tamala Julian on May 2015.

## 2013-06-07 ENCOUNTER — Other Ambulatory Visit (INDEPENDENT_AMBULATORY_CARE_PROVIDER_SITE_OTHER): Payer: BC Managed Care – PPO

## 2013-06-07 DIAGNOSIS — I4891 Unspecified atrial fibrillation: Secondary | ICD-10-CM

## 2013-06-07 LAB — RENAL FUNCTION PANEL
Albumin: 3.5 g/dL (ref 3.5–5.2)
BUN: 12 mg/dL (ref 6–23)
CO2: 25 mEq/L (ref 19–32)
Calcium: 9.1 mg/dL (ref 8.4–10.5)
Chloride: 105 mEq/L (ref 96–112)
Creatinine, Ser: 0.9 mg/dL (ref 0.4–1.5)
GFR: 92.16 mL/min (ref >60.00)
Glucose, Bld: 144 mg/dL — ABNORMAL HIGH (ref 70–99)
Phosphorus: 2.9 mg/dL (ref 2.3–4.6)
Potassium: 3.1 mEq/L — ABNORMAL LOW (ref 3.5–5.1)
Sodium: 138 mEq/L (ref 135–145)

## 2013-06-07 LAB — HEMOGLOBIN AND HEMATOCRIT, BLOOD
HCT: 35.9 % — ABNORMAL LOW (ref 39.0–52.0)
Hemoglobin: 11.4 g/dL — ABNORMAL LOW (ref 13.0–17.0)

## 2013-06-11 ENCOUNTER — Telehealth: Payer: Self-pay

## 2013-06-11 DIAGNOSIS — I4891 Unspecified atrial fibrillation: Secondary | ICD-10-CM

## 2013-06-11 MED ORDER — POTASSIUM CHLORIDE CRYS ER 20 MEQ PO TBCR: 20.0000 meq | EXTENDED_RELEASE_TABLET | Freq: Every day | ORAL | Status: DC

## 2013-06-11 NOTE — Telephone Encounter (Signed)
Pt given lab results and Dr.Smith instructions .Potassium is low and slight drop in hemoglobin. K-dur 20 meq BID x 3 days. Repeat potassium and hgb in 1 month.lab appt made for 07/12/13.pt aware and verbalized understanding.

## 2013-06-11 NOTE — Telephone Encounter (Signed)
Message copied by Lamar Laundry on Tue Jun 11, 2013  4:15 PM ------      Message from: Daneen Schick      Created: Sun Jun 09, 2013  2:22 PM       Potassium is low and slight drop in hemoglobin. K-dur 20 meq BID x 3 days. Repeat potassium and hgb in 1 month. ------

## 2013-07-10 ENCOUNTER — Ambulatory Visit: Payer: BC Managed Care – PPO | Admitting: Interventional Cardiology

## 2013-07-12 ENCOUNTER — Other Ambulatory Visit (INDEPENDENT_AMBULATORY_CARE_PROVIDER_SITE_OTHER): Payer: BC Managed Care – PPO

## 2013-07-12 DIAGNOSIS — I4891 Unspecified atrial fibrillation: Secondary | ICD-10-CM

## 2013-07-12 LAB — BASIC METABOLIC PANEL
BUN: 12 mg/dL (ref 6–23)
CO2: 26 mEq/L (ref 19–32)
Calcium: 8.7 mg/dL (ref 8.4–10.5)
Chloride: 104 mEq/L (ref 96–112)
Creatinine, Ser: 0.9 mg/dL (ref 0.4–1.5)
GFR: 86.44 mL/min (ref >60.00)
Glucose, Bld: 92 mg/dL (ref 70–99)
Potassium: 3.6 mEq/L (ref 3.5–5.1)
Sodium: 139 mEq/L (ref 135–145)

## 2013-07-12 LAB — HEMOGLOBIN: Hemoglobin: 11.6 g/dL — ABNORMAL LOW (ref 13.0–17.0)

## 2013-07-15 ENCOUNTER — Encounter: Payer: Self-pay | Admitting: Interventional Cardiology

## 2013-07-15 ENCOUNTER — Ambulatory Visit (INDEPENDENT_AMBULATORY_CARE_PROVIDER_SITE_OTHER): Payer: BC Managed Care – PPO | Admitting: Interventional Cardiology

## 2013-07-15 VITALS — BP 140/78 | HR 66 | Ht 71.0 in | Wt 266.0 lb

## 2013-07-15 DIAGNOSIS — I4891 Unspecified atrial fibrillation: Secondary | ICD-10-CM

## 2013-07-15 DIAGNOSIS — Z7901 Long term (current) use of anticoagulants: Secondary | ICD-10-CM

## 2013-07-15 DIAGNOSIS — E875 Hyperkalemia: Secondary | ICD-10-CM

## 2013-07-15 DIAGNOSIS — E785 Hyperlipidemia, unspecified: Secondary | ICD-10-CM

## 2013-07-15 DIAGNOSIS — Z5181 Encounter for therapeutic drug level monitoring: Secondary | ICD-10-CM

## 2013-07-15 DIAGNOSIS — I10 Essential (primary) hypertension: Secondary | ICD-10-CM | POA: Insufficient documentation

## 2013-07-15 DIAGNOSIS — Z79899 Other long term (current) drug therapy: Secondary | ICD-10-CM

## 2013-07-15 MED ORDER — DILTIAZEM HCL ER COATED BEADS 360 MG PO CP24: 360.0000 mg | ORAL_CAPSULE | Freq: Every evening | ORAL | Status: DC

## 2013-07-15 MED ORDER — FLECAINIDE ACETATE 100 MG PO TABS: 100.0000 mg | ORAL_TABLET | Freq: Two times a day (BID) | ORAL | Status: DC

## 2013-07-15 MED ORDER — DABIGATRAN ETEXILATE MESYLATE 150 MG PO CAPS: 150.0000 mg | ORAL_CAPSULE | Freq: Two times a day (BID) | ORAL | Status: DC

## 2013-07-15 NOTE — Progress Notes (Signed)
Patient ID: Brian Myers, male   DOB: 12-01-1945, 68 y.o.   MRN: [id number]    [address] [place], [place]  [address] Phone: [phone] Fax:  [phone]  Date:  07/15/2013   ID:  Brian Myers, DOB 1945-04-24, MRN [id number]  PCP:  Gerrit Heck, MD   ASSESSMENT:  1. Paroxysmal atrial fibrillation, on flecainide therapy for suppression 2. Flecainide therapy without complications 3. History of hypokalemia with recent potassium of 3.7 on no therapy  PLAN:  1. Natural supplementation of potassium 2. Continue flecainide at the current dose. 3. Consider monitoring if frequent episodes of palpitation   SUBJECTIVE: Brian Myers is a 68 y.o. male who has history of paroxysmal atrial fibrillation who for the last year or so has been managed with flecainide at 100 mg twice daily. He feels he has intermittent episodes of unexplained weakness and palpitations. These can last anywhere from minutes to an hour or so. The chest pain. He denies syncope. No episodes of atrial fibrillation noted while hospitalized with right knee replacement earlier this year.   Wt Readings from Last 3 Encounters:  07/15/13 266 lb (120.657 kg)  04/12/13 263 lb (119.296 kg)  11/10/10 255 lb (115.667 kg)     Past Medical History  Diagnosis Date  . Asthma   . Hyperlipidemia   . Sleep apnea     with cpap  . GERD (gastroesophageal reflux disease)   . Persistent atrial fibrillation     Dr Tamala Julian  . Shortness of breath     WHEN HEART BEAT IRREGULAR  . Pneumonia     LAST EPISODE JAN 2015  . Arthritis     KNEES  . Right knee meniscal tear     PAIN RT KNEE  . BPH (benign prostatic hypertrophy)     DIFFICULTY GETTING STREAM STARTED  . Eczema     HANDS  . Hyperlipidemia     DOES NOT TOLERATE ANY OF THE STATINS  . H/O hiatal hernia   . Dysrhythmia     ATRIAL FIB AND FLUTTER-DR. Linard Millers PT STATES HIS HEART IS USUALLY IN RHYTHM - BUT FOR PAST 10 DAYS OR MORE - HE HAS  EXPERIENCED PALPITATIONS AND BREATHLESSNESS - HE HAS CALLED DR. Lemmie Evens [facility] OFFICE     Current Outpatient Prescriptions  Medication Sig Dispense Refill  . acidophilus (RISAQUAD) CAPS capsule Take 1 capsule by mouth daily.      Marland Kitchen albuterol (PROVENTIL HFA;VENTOLIN HFA) 108 (90 BASE) MCG/ACT inhaler Inhale 2 puffs into the lungs every 6 (six) hours as needed for wheezing or shortness of breath.       Marland Kitchen augmented betamethasone dipropionate (DIPROLENE-AF) 0.05 % ointment Apply 1 application topically daily. Uses on hands for eczema      . budesonide (PULMICORT) 180 MCG/ACT inhaler Inhale 1 puff into the lungs 2 (two) times daily.       . clobetasol cream (TEMOVATE) 8.56 % Apply 1 application topically 3 (three) times daily as needed (rash).      . dabigatran (PRADAXA) 150 MG CAPS capsule Take 1 capsule (150 mg total) by mouth every 12 (twelve) hours.  60 capsule  11  . diltiazem (CARDIZEM CD) 360 MG 24 hr capsule Take 1 capsule (360 mg total) by mouth every evening.  30 capsule  11  . esomeprazole (NEXIUM) 40 MG capsule Take 40 mg by mouth every evening.       . flecainide (TAMBOCOR) 100 MG tablet Take 1 tablet (  100 mg total) by mouth 2 (two) times daily.  30 tablet  11  . methylcellulose (ARTIFICIAL TEARS) 1 % ophthalmic solution Place 1 drop into both eyes 2 (two) times daily as needed (dry eyes).      . salmeterol (SEREVENT) 50 MCG/DOSE diskus inhaler Inhale 1 puff into the lungs 2 (two) times daily.        No current facility-administered medications for this visit.    Allergies:    Allergies  Allergen Reactions  . Penicillins Anaphylaxis  . Sulfa Drugs Cross Reactors Anaphylaxis  . Statins     INTOLERANT TO STATINS - SEVERE MUSCLE CRAMPS    Social History:  The patient  reports that he has never smoked. He has never used smokeless tobacco. He reports that he drinks alcohol. He reports that he does not use illicit drugs.   ROS:  Please see the history of present illness.   No  neurological complaints. Denies syncope.   All other systems reviewed and negative.   OBJECTIVE: VS:  BP 140/78  Pulse 66  Ht 5\' 11"  (1.803 m)  Wt 266 lb (120.657 kg)  BMI 37.12 kg/m2 Well nourished, well developed, in no acute distress, obese HEENT: normal Neck: JVD flat. Carotid bruit absent  Cardiac:  normal S1, S2; RRR; no murmur Lungs:  clear to auscultation bilaterally, no wheezing, rhonchi or rales Abd: soft, nontender, no hepatomegaly Ext: Edema absent. Pulses 2+ bilateral Skin: warm and dry Neuro:  CNs 2-12 intact, no focal abnormalities noted  EKG:  Not repeated       Signed, Illene Labrador III, MD 07/15/2013 3:20 PM

## 2013-07-15 NOTE — Patient Instructions (Addendum)
Your physician recommends that you continue on your current medications as directed. Please refer to the Current Medication list given to you today.  Your physician recommends that you return for lab work in: 6 months ( Potassium, Creatinine, Hemoglobin)  Your physician wants you to follow-up in: 6 months You will receive a reminder letter in the mail two months in advance. If you don't receive a letter, please call our office to schedule the follow-up appointment.

## 2013-07-17 ENCOUNTER — Telehealth: Payer: Self-pay

## 2013-07-17 NOTE — Telephone Encounter (Signed)
Message copied by Lamar Laundry on Wed Jul 17, 2013  8:09 AM ------      Message from: Daneen Schick      Created: Tue Jul 16, 2013  7:32 PM       Normal ------

## 2013-07-17 NOTE — Telephone Encounter (Signed)
pt given lab results.  Normal.pt verbalized understanding. 

## 2013-10-03 ENCOUNTER — Other Ambulatory Visit: Payer: Self-pay | Admitting: *Deleted

## 2013-10-03 MED ORDER — FLECAINIDE ACETATE 100 MG PO TABS: 100.0000 mg | ORAL_TABLET | Freq: Two times a day (BID) | ORAL | Status: DC

## 2014-01-13 ENCOUNTER — Ambulatory Visit (INDEPENDENT_AMBULATORY_CARE_PROVIDER_SITE_OTHER): Payer: BC Managed Care – PPO | Admitting: Interventional Cardiology

## 2014-01-13 ENCOUNTER — Encounter: Payer: Self-pay | Admitting: Interventional Cardiology

## 2014-01-13 VITALS — BP 146/82 | HR 61 | Ht 71.0 in | Wt 268.0 lb

## 2014-01-13 DIAGNOSIS — I10 Essential (primary) hypertension: Secondary | ICD-10-CM

## 2014-01-13 DIAGNOSIS — I480 Paroxysmal atrial fibrillation: Secondary | ICD-10-CM

## 2014-01-13 DIAGNOSIS — Z7901 Long term (current) use of anticoagulants: Secondary | ICD-10-CM

## 2014-01-13 NOTE — Patient Instructions (Signed)
Your physician recommends that you continue on your current medications as directed. Please refer to the Current Medication list given to you today.  Your physician has recommended that you wear a holter monitor. Holter monitors are medical devices that record the heart's electrical activity. Doctors most often use these monitors to diagnose arrhythmias. Arrhythmias are problems with the speed or rhythm of the heartbeat. The monitor is a small, portable device. You can wear one while you do your normal daily activities. This is usually used to diagnose what is causing palpitations/syncope (passing out).  Your physician wants you to follow-up in: 6 months You will receive a reminder letter in the mail two months in advance. If you don't receive a letter, please call our office to schedule the follow-up appointment.

## 2014-01-13 NOTE — Progress Notes (Signed)
Patient ID: Brian Myers, male   DOB: 11/05/1945, 68 y.o.   MRN: [id number]    [address] [place], [place]  [address] Phone: [phone] Fax:  [phone]  Date:  01/13/2014   ID:  Brian Myers, DOB [birth year], MRN [id number]  PCP:  Gerrit Heck, MD   ASSESSMENT:.  1. Paroxysmal atrial fibrillation with flutter 9 therapy for suppression. Rule out tachybradycardia syndrome 2. Flecainide therapy without obvious toxicity/complications. 3. Obstructive sleep apnea  PLAN:  1. 24 hour Holter monitor 2. Six-month clinical follow-up   SUBJECTIVE: Brian Myers is a 68 y.o. male who is having intermittent episodes of extreme fatigue. During these times he measures the heart rate and it is between 50 and 60 bpm. He denies tachycardia as with atrial fibrillation. This is relatively infrequent but is new and concerning to the patient   Wt Readings from Last 3 Encounters:  01/13/14 268 lb (121.564 kg)  07/15/13 266 lb (120.657 kg)  04/12/13 263 lb (119.296 kg)     Past Medical History  Diagnosis Date  . Asthma   . Hyperlipidemia   . Sleep apnea     with cpap  . GERD (gastroesophageal reflux disease)   . Persistent atrial fibrillation     Dr Tamala Julian  . Shortness of breath     WHEN HEART BEAT IRREGULAR  . Pneumonia     LAST EPISODE JAN 2015  . Arthritis     KNEES  . Right knee meniscal tear     PAIN RT KNEE  . BPH (benign prostatic hypertrophy)     DIFFICULTY GETTING STREAM STARTED  . Eczema     HANDS  . Hyperlipidemia     DOES NOT TOLERATE ANY OF THE STATINS  . H/O hiatal hernia   . Dysrhythmia     ATRIAL FIB AND FLUTTER-DR. Linard Millers PT STATES HIS HEART IS USUALLY IN RHYTHM - BUT FOR PAST 10 DAYS OR MORE - HE HAS EXPERIENCED PALPITATIONS AND BREATHLESSNESS - HE HAS CALLED DR. Lemmie Evens [facility] OFFICE     Current Outpatient Prescriptions  Medication Sig Dispense Refill  . acidophilus (RISAQUAD) CAPS capsule Take 1 capsule by mouth daily.     Marland Kitchen albuterol (PROVENTIL HFA;VENTOLIN HFA) 108 (90 BASE) MCG/ACT inhaler Inhale 2 puffs into the lungs every 6 (six) hours as needed for wheezing or shortness of breath.     Marland Kitchen augmented betamethasone dipropionate (DIPROLENE-AF) 0.05 % ointment Apply 1 application topically daily. Uses on hands for eczema    . budesonide (PULMICORT) 180 MCG/ACT inhaler Inhale 1 puff into the lungs 2 (two) times daily.     . clobetasol cream (TEMOVATE) 2.70 % Apply 1 application topically 3 (three) times daily as needed (rash).    . dabigatran (PRADAXA) 150 MG CAPS capsule Take 1 capsule (150 mg total) by mouth every 12 (twelve) hours. 60 capsule 11  . diltiazem (CARDIZEM CD) 360 MG 24 hr capsule Take 1 capsule (360 mg total) by mouth every evening. 30 capsule 11  . esomeprazole (NEXIUM) 40 MG capsule Take 40 mg by mouth every evening.     . flecainide (TAMBOCOR) 100 MG tablet Take 1 tablet (100 mg total) by mouth 2 (two) times daily. 60 tablet 3  . methylcellulose (ARTIFICIAL TEARS) 1 % ophthalmic solution Place 1 drop into both eyes 2 (two) times daily as needed (dry eyes).    . salmeterol (SEREVENT) 50 MCG/DOSE diskus inhaler Inhale 1 puff into the lungs 2 (two)  times daily.      No current facility-administered medications for this visit.    Allergies:    Allergies  Allergen Reactions  . Penicillins Anaphylaxis  . Sulfa Drugs Cross Reactors Anaphylaxis  . Statins     INTOLERANT TO STATINS - SEVERE MUSCLE CRAMPS    Social History:  The patient  reports that he has never smoked. He has never used smokeless tobacco. He reports that he drinks alcohol. He reports that he does not use illicit drugs.   ROS:  Please see the history of present illness.   No transient neurological complaints. Denies edema.   All other systems reviewed and negative.   OBJECTIVE: VS:  BP 146/82 mmHg  Pulse 61  Ht 5\' 11"  (1.803 m)  Wt 268 lb (121.564 kg)  BMI 37.39 kg/m2 Well nourished, well developed, in no acute distress,  obese HEENT: normal Neck: JVD flat. Carotid bruit absent  Cardiac:  normal S1, S2; RRR; no murmur Lungs:  clear to auscultation bilaterally, no wheezing, rhonchi or rales Abd: soft, nontender, no hepatomegaly Ext: Edema absent. Pulses 2+ Skin: warm and dry Neuro:  CNs 2-12 intact, no focal abnormalities noted  EKG:  Not performed       Signed, Illene Labrador III, MD 01/13/2014 4:29 PM

## 2014-01-30 ENCOUNTER — Other Ambulatory Visit: Payer: Self-pay | Admitting: Interventional Cardiology

## 2014-01-31 ENCOUNTER — Encounter (INDEPENDENT_AMBULATORY_CARE_PROVIDER_SITE_OTHER): Payer: BC Managed Care – PPO

## 2014-01-31 ENCOUNTER — Other Ambulatory Visit: Payer: Self-pay

## 2014-01-31 ENCOUNTER — Encounter: Payer: Self-pay | Admitting: Radiology

## 2014-01-31 DIAGNOSIS — I480 Paroxysmal atrial fibrillation: Secondary | ICD-10-CM

## 2014-01-31 MED ORDER — FLECAINIDE ACETATE 100 MG PO TABS: 100.0000 mg | ORAL_TABLET | Freq: Two times a day (BID) | ORAL | Status: DC

## 2014-01-31 NOTE — Progress Notes (Signed)
E cardio 24hr holter applied 

## 2014-01-31 NOTE — Telephone Encounter (Signed)
Pt came for a cardiac monitor appt and requested a refill for flecainide . Rx sent to pt pharmacy

## 2014-02-26 ENCOUNTER — Telehealth: Payer: Self-pay

## 2014-02-26 NOTE — Telephone Encounter (Signed)
Pt aware of holter monitor results -No Afib Pt verbalized understanding.

## 2014-07-16 ENCOUNTER — Other Ambulatory Visit: Payer: Self-pay | Admitting: Interventional Cardiology

## 2014-08-24 NOTE — Progress Notes (Signed)
Cardiology Office Note   Date:  08/25/2014   ID:  Brian Myers, DOB [birth year], MRN [id number]  PCP:  Gerrit Heck, MD  Cardiologist:  Sinclair Grooms, MD   Chief Complaint  Patient presents with  . Atrial Fibrillation      History of Present Illness: Brian Myers is a 69 y.o. male who presents for paroxysmal atrial fibrillation, hypertension, sleep apnea, and chronic anticoagulation therapy.  He has had no prolonged palpitations. No bleeding or other complications from his medication. He denies dyspnea. He has not had syncope.  Past Medical History  Diagnosis Date  . Asthma   . Hyperlipidemia   . Sleep apnea     with cpap  . GERD (gastroesophageal reflux disease)   . Persistent atrial fibrillation     Dr Tamala Julian  . Shortness of breath     WHEN HEART BEAT IRREGULAR  . Pneumonia     LAST EPISODE JAN 2015  . Arthritis     KNEES  . Right knee meniscal tear     PAIN RT KNEE  . BPH (benign prostatic hypertrophy)     DIFFICULTY GETTING STREAM STARTED  . Eczema     HANDS  . Hyperlipidemia     DOES NOT TOLERATE ANY OF THE STATINS  . H/O hiatal hernia   . Dysrhythmia     ATRIAL FIB AND FLUTTER-DR. Linard Millers PT STATES HIS HEART IS USUALLY IN RHYTHM - BUT FOR PAST 10 DAYS OR MORE - HE HAS EXPERIENCED PALPITATIONS AND BREATHLESSNESS - HE HAS CALLED DR. Lemmie Evens [facility] OFFICE     Past Surgical History  Procedure Laterality Date  . Cervical fusion  1991  . Left knee arthroscpy  2009  . Knee arthroscopy Right 04/17/2013    Procedure: ARTHROSCOPY RIGHT KNEE WITH MEDIAL AND LATERAL DEBRIDEMENT AND CONDRAPLASTY;  Surgeon: Gearlean Alf, MD;  Location: [facility];  Service: Orthopedics;  Laterality: Right;     Current Outpatient Prescriptions  Medication Sig Dispense Refill  . acidophilus (RISAQUAD) CAPS capsule Take 1 capsule by mouth daily.    Marland Kitchen albuterol (PROVENTIL HFA;VENTOLIN HFA) 108 (90 BASE) MCG/ACT inhaler Inhale 2 puffs into the lungs every 6 (six)  hours as needed for wheezing or shortness of breath.     . budesonide (PULMICORT) 180 MCG/ACT inhaler Inhale 1 puff into the lungs 2 (two) times daily.     Marland Kitchen diltiazem (CARDIZEM CD) 360 MG 24 hr capsule TAKE ONE CAPSULE BY MOUTH EVERY EVENING 30 capsule 6  . esomeprazole (NEXIUM) 40 MG capsule Take 40 mg by mouth every evening.     . flecainide (TAMBOCOR) 100 MG tablet Take 1 tablet (100 mg total) by mouth 2 (two) times daily. 60 tablet 11  . methylcellulose (ARTIFICIAL TEARS) 1 % ophthalmic solution Place 1 drop into both eyes 2 (two) times daily as needed (dry eyes).    Marland Kitchen PRADAXA 150 MG CAPS capsule TAKE 1 CAPSULE (150 MG TOTAL) BY MOUTH EVERY 12 (TWELVE) HOURS. 60 capsule 6  . salmeterol (SEREVENT) 50 MCG/DOSE diskus inhaler Inhale 1 puff into the lungs 2 (two) times daily.      No current facility-administered medications for this visit.    Allergies:   Penicillins; Sulfa drugs cross reactors; and Statins    Social History:  The patient  reports that he has never smoked. He has never used smokeless tobacco. He reports that he drinks alcohol. He reports that he does not use illicit drugs.   Family  History:  The patient's family history includes Arrhythmia in his mother; Hypertension in his father; Mitral valve prolapse in his mother.    ROS:  Please see the history of present illness.   Otherwise, review of systems are positive for increased difficulty with asthma and wheezing, low back discomfort, easy bruising, and cough..   All other systems are reviewed and negative.    PHYSICAL EXAM: VS:  BP 138/84 mmHg  Pulse 75  Ht 5\' 11"  (1.803 m)  Wt 120.203 kg (265 lb)  BMI 36.98 kg/m2 , BMI Body mass index is 36.98 kg/(m^2). GEN: Well nourished, well developed, in no acute distress HEENT: normal Neck: no JVD, carotid bruits, or masses Cardiac: RRR; no murmurs, rubs, or gallops,no edema  Respiratory:  clear to auscultation bilaterally, normal work of breathing GI: soft, nontender,  nondistended, + BS MS: no deformity or atrophy Skin: warm and dry, no rash Neuro:  Strength and sensation are intact Psych: euthymic mood, full affect   EKG:  EKG is ordered today. The ekg ordered today demonstrates normal sinus rhythm with nonspecific T-wave abnormality. First AV block.   Recent Labs: No results found for requested labs within last 365 days.    Lipid Panel No results found for: CHOL, TRIG, HDL, CHOLHDL, VLDL, LDLCALC, LDLDIRECT    Wt Readings from Last 3 Encounters:  08/25/14 120.203 kg (265 lb)  01/13/14 121.564 kg (268 lb)  07/15/13 120.657 kg (266 lb)      Other studies Reviewed: Additional studies/ records that were reviewed today include: . Review of the above records demonstrates: No recent laboratory data   ASSESSMENT AND PLAN:  1. Paroxysmal atrial fibrillation No recent recurrences or symptoms  2. Essential hypertension No recent laboratory data. Blood pressure is under good control  3. Hyperlipidemia Followed by primary care  4. Sleep apnea On therapy  5. Obesity Increasing despite bariatric surgery    Current medicines are reviewed at length with the patient today.  The patient does not have concerns regarding medicines.  The following changes have been made:  no change. We will perform a basic metabolic panel and hemoglobin today  Labs/ tests ordered today include:   Orders Placed This Encounter  Procedures  . Basic metabolic panel  . Hemoglobin  . EKG 12-Lead     Disposition:   FU with HS in 1 year  Signed, Sinclair Grooms, MD  08/25/2014 9:15 AM    [address], [place], [place]  [address] Phone: [phone]; Fax: [phone]

## 2014-08-25 ENCOUNTER — Encounter: Payer: Self-pay | Admitting: Interventional Cardiology

## 2014-08-25 ENCOUNTER — Ambulatory Visit (INDEPENDENT_AMBULATORY_CARE_PROVIDER_SITE_OTHER): Payer: BLUE CROSS/BLUE SHIELD | Admitting: Interventional Cardiology

## 2014-08-25 VITALS — BP 138/84 | HR 75 | Ht 71.0 in | Wt 265.0 lb

## 2014-08-25 DIAGNOSIS — E669 Obesity, unspecified: Secondary | ICD-10-CM

## 2014-08-25 DIAGNOSIS — I480 Paroxysmal atrial fibrillation: Secondary | ICD-10-CM

## 2014-08-25 DIAGNOSIS — Z7901 Long term (current) use of anticoagulants: Secondary | ICD-10-CM | POA: Diagnosis not present

## 2014-08-25 DIAGNOSIS — I10 Essential (primary) hypertension: Secondary | ICD-10-CM

## 2014-08-25 DIAGNOSIS — G4730 Sleep apnea, unspecified: Secondary | ICD-10-CM | POA: Diagnosis not present

## 2014-08-25 DIAGNOSIS — E785 Hyperlipidemia, unspecified: Secondary | ICD-10-CM

## 2014-08-25 LAB — BASIC METABOLIC PANEL
BUN: 14 mg/dL (ref 6–23)
CO2: 30 mEq/L (ref 19–32)
Calcium: 8.9 mg/dL (ref 8.4–10.5)
Chloride: 102 mEq/L (ref 96–112)
Creatinine, Ser: 0.94 mg/dL (ref 0.40–1.50)
GFR: 85.09 mL/min (ref >60.00)
Glucose, Bld: 99 mg/dL (ref 70–99)
Potassium: 3.5 mEq/L (ref 3.5–5.1)
Sodium: 138 mEq/L (ref 135–145)

## 2014-08-25 LAB — HEMOGLOBIN: Hemoglobin: 11.3 g/dL — ABNORMAL LOW (ref 13.0–17.0)

## 2014-08-25 NOTE — Patient Instructions (Signed)
Medication Instructions:  Your physician recommends that you continue on your current medications as directed. Please refer to the Current Medication list given to you today.   Labwork: Bmet, Hemoglobin today  Testing/Procedures: None   Follow-Up: Your physician wants you to follow-up in: 1 year with Dr.Smith You will receive a reminder letter in the mail two months in advance. If you don't receive a letter, please call our office to schedule the follow-up appointment.   Any Other Special Instructions Will Be Listed Below (If Applicable).

## 2014-08-27 ENCOUNTER — Telehealth: Payer: Self-pay

## 2014-08-27 NOTE — Telephone Encounter (Signed)
-----   Message from Belva Crome, MD sent at 08/26/2014  6:22 PM EDT ----- Labs are stable. Needs to increase potassium in diet. Banana's, raisins, etc

## 2014-08-27 NOTE — Telephone Encounter (Signed)
Pt aware of lab results.  Labs are stable. Needs to increase potassium in diet. Banana's, raisins, etc  Pt verbalized understanding.

## 2014-11-18 ENCOUNTER — Other Ambulatory Visit: Payer: Self-pay | Admitting: Interventional Cardiology

## 2014-11-27 DIAGNOSIS — J45909 Unspecified asthma, uncomplicated: Secondary | ICD-10-CM | POA: Diagnosis not present

## 2014-11-27 DIAGNOSIS — R21 Rash and other nonspecific skin eruption: Secondary | ICD-10-CM | POA: Diagnosis not present

## 2014-11-27 DIAGNOSIS — Z23 Encounter for immunization: Secondary | ICD-10-CM | POA: Diagnosis not present

## 2014-12-17 DIAGNOSIS — D649 Anemia, unspecified: Secondary | ICD-10-CM | POA: Diagnosis not present

## 2014-12-23 DIAGNOSIS — Z1211 Encounter for screening for malignant neoplasm of colon: Secondary | ICD-10-CM | POA: Diagnosis not present

## 2015-01-02 DIAGNOSIS — D649 Anemia, unspecified: Secondary | ICD-10-CM | POA: Diagnosis not present

## 2015-01-19 DIAGNOSIS — R195 Other fecal abnormalities: Secondary | ICD-10-CM | POA: Diagnosis not present

## 2015-01-19 DIAGNOSIS — K219 Gastro-esophageal reflux disease without esophagitis: Secondary | ICD-10-CM | POA: Diagnosis not present

## 2015-01-19 DIAGNOSIS — D649 Anemia, unspecified: Secondary | ICD-10-CM | POA: Diagnosis not present

## 2015-02-10 DIAGNOSIS — J209 Acute bronchitis, unspecified: Secondary | ICD-10-CM | POA: Diagnosis not present

## 2015-02-10 DIAGNOSIS — J4540 Moderate persistent asthma, uncomplicated: Secondary | ICD-10-CM | POA: Diagnosis not present

## 2015-02-10 DIAGNOSIS — J329 Chronic sinusitis, unspecified: Secondary | ICD-10-CM | POA: Diagnosis not present

## 2015-03-13 DIAGNOSIS — R972 Elevated prostate specific antigen [PSA]: Secondary | ICD-10-CM | POA: Diagnosis not present

## 2015-03-15 ENCOUNTER — Other Ambulatory Visit: Payer: Self-pay | Admitting: Interventional Cardiology

## 2015-03-18 DIAGNOSIS — R3916 Straining to void: Secondary | ICD-10-CM | POA: Diagnosis not present

## 2015-03-18 DIAGNOSIS — Z0000 Encounter for general adult medical examination without abnormal findings: Secondary | ICD-10-CM | POA: Diagnosis not present

## 2015-03-18 DIAGNOSIS — R972 Elevated prostate specific antigen [PSA]: Secondary | ICD-10-CM | POA: Diagnosis not present

## 2015-03-18 DIAGNOSIS — N401 Enlarged prostate with lower urinary tract symptoms: Secondary | ICD-10-CM | POA: Diagnosis not present

## 2015-03-24 ENCOUNTER — Other Ambulatory Visit: Payer: Self-pay | Admitting: Interventional Cardiology

## 2015-04-09 ENCOUNTER — Telehealth: Payer: Self-pay

## 2015-04-09 NOTE — Telephone Encounter (Signed)
Cleared for surgery and okay to hold Pradaxa for 72 hours prior to procedure. Resume when safe.

## 2015-04-09 NOTE — Telephone Encounter (Signed)
Pt clearance paperwork faxed to [facility]

## 2015-04-09 NOTE — Telephone Encounter (Signed)
Requesting surgical clearance:   1. Type of surgery: Colonoscopy/Endoscopy  2. Surgeon: Dr. Elvin So  3. Surgical date: 04/30/2015  4. Medications that need to be held: Pradaxa  5. CAD: no     6. I will defer to: [facility] Attn: Dr. Paulita Fujita Fax [phone] Phone; [phone]

## 2015-04-30 DIAGNOSIS — D126 Benign neoplasm of colon, unspecified: Secondary | ICD-10-CM | POA: Diagnosis not present

## 2015-04-30 DIAGNOSIS — K621 Rectal polyp: Secondary | ICD-10-CM | POA: Diagnosis not present

## 2015-04-30 DIAGNOSIS — R195 Other fecal abnormalities: Secondary | ICD-10-CM | POA: Diagnosis not present

## 2015-04-30 DIAGNOSIS — D125 Benign neoplasm of sigmoid colon: Secondary | ICD-10-CM | POA: Diagnosis not present

## 2015-04-30 DIAGNOSIS — D122 Benign neoplasm of ascending colon: Secondary | ICD-10-CM | POA: Diagnosis not present

## 2015-04-30 DIAGNOSIS — D509 Iron deficiency anemia, unspecified: Secondary | ICD-10-CM | POA: Diagnosis not present

## 2015-04-30 DIAGNOSIS — D120 Benign neoplasm of cecum: Secondary | ICD-10-CM | POA: Diagnosis not present

## 2015-04-30 DIAGNOSIS — K640 First degree hemorrhoids: Secondary | ICD-10-CM | POA: Diagnosis not present

## 2015-05-14 ENCOUNTER — Other Ambulatory Visit: Payer: Self-pay | Admitting: Interventional Cardiology

## 2015-05-14 DIAGNOSIS — D649 Anemia, unspecified: Secondary | ICD-10-CM | POA: Diagnosis not present

## 2015-05-14 DIAGNOSIS — I10 Essential (primary) hypertension: Secondary | ICD-10-CM | POA: Diagnosis not present

## 2015-05-14 DIAGNOSIS — E785 Hyperlipidemia, unspecified: Secondary | ICD-10-CM | POA: Diagnosis not present

## 2015-05-18 DIAGNOSIS — E7800 Pure hypercholesterolemia, unspecified: Secondary | ICD-10-CM | POA: Diagnosis not present

## 2015-05-18 DIAGNOSIS — D649 Anemia, unspecified: Secondary | ICD-10-CM | POA: Diagnosis not present

## 2015-05-18 DIAGNOSIS — I4891 Unspecified atrial fibrillation: Secondary | ICD-10-CM | POA: Diagnosis not present

## 2015-05-18 DIAGNOSIS — G4730 Sleep apnea, unspecified: Secondary | ICD-10-CM | POA: Diagnosis not present

## 2015-05-18 DIAGNOSIS — R7301 Impaired fasting glucose: Secondary | ICD-10-CM | POA: Diagnosis not present

## 2015-06-06 ENCOUNTER — Other Ambulatory Visit: Payer: Self-pay | Admitting: Interventional Cardiology

## 2015-06-08 NOTE — Telephone Encounter (Signed)
flecainide (TAMBOCOR) 100 MG tablet  Medication   Date: 05/14/2015  Department: [facility]  Ordering/Authorizing: Belva Crome, MD      Order Providers    Prescribing Provider Encounter Provider   Belva Crome, MD Belva Crome, MD    Medication Detail      Disp Refills Start End     flecainide (TAMBOCOR) 100 MG tablet 180 tablet 0 05/14/2015     Sig: TAKE 1 TABLET BY MOUTH TWICE DAILY    E-Prescribing Status: Receipt confirmed by pharmacy (05/14/2015 11:00 AM EDT)     Pharmacy    [id number] - [place], [place] - [address] AT [address]

## 2015-07-06 DIAGNOSIS — E7800 Pure hypercholesterolemia, unspecified: Secondary | ICD-10-CM | POA: Diagnosis not present

## 2015-07-06 DIAGNOSIS — R7301 Impaired fasting glucose: Secondary | ICD-10-CM | POA: Diagnosis not present

## 2015-07-06 DIAGNOSIS — D649 Anemia, unspecified: Secondary | ICD-10-CM | POA: Diagnosis not present

## 2015-07-13 DIAGNOSIS — G4730 Sleep apnea, unspecified: Secondary | ICD-10-CM | POA: Diagnosis not present

## 2015-07-13 DIAGNOSIS — K219 Gastro-esophageal reflux disease without esophagitis: Secondary | ICD-10-CM | POA: Diagnosis not present

## 2015-07-13 DIAGNOSIS — R7303 Prediabetes: Secondary | ICD-10-CM | POA: Diagnosis not present

## 2015-08-04 ENCOUNTER — Institutional Professional Consult (permissible substitution): Payer: Medicare Other | Admitting: Neurology

## 2015-08-04 ENCOUNTER — Encounter: Payer: Self-pay | Admitting: Neurology

## 2015-08-04 ENCOUNTER — Ambulatory Visit (INDEPENDENT_AMBULATORY_CARE_PROVIDER_SITE_OTHER): Payer: Medicare Other | Admitting: Neurology

## 2015-08-04 VITALS — BP 152/82 | HR 66 | Resp 20 | Ht 71.0 in | Wt 274.0 lb

## 2015-08-04 DIAGNOSIS — Z9989 Dependence on other enabling machines and devices: Principal | ICD-10-CM

## 2015-08-04 DIAGNOSIS — G4733 Obstructive sleep apnea (adult) (pediatric): Secondary | ICD-10-CM

## 2015-08-04 NOTE — Progress Notes (Signed)
SLEEP MEDICINE CLINIC   Provider:  Larey Seat, M D  Referring Provider: Leighton Ruff, MD Primary Care Physician:  Gerrit Heck, MD  Chief Complaint  Patient presents with  . New Patient (Initial Visit)    using [facility], needs new cpap, rm 10, alone    HPI:  Brian Myers is a 70 y.o. male , seen here as a referral  from Dr. Drema Dallas for re-establishing this patient for CPAP care.    Chief complaint according to patient : Originally I met Brian Myers on Brian fifth of February  2008. He was referred directly by Dr. Sheryn Bison for a sleep study , which was then followed by a CPAP titration. His diagnostic polysomnogram documented an AHI of 50. He was titrated to CPAP at 10 cm water setting, he did have some periodic limb movements. He has used CPAP ever since. After his titration at [facility] he has been followed in Brian office in 2008 2010 in 2011. His BMI exceeded 35 in his last visit he had also a slightly elevated blood pressure but he did not feel excessively daytime sleepy any longer. At Brian time he was still employed at [employer]. He was diagnosed with atrial fibrillation and treated for stroke prevention was Pradaxa he continued to feel some palpitations, some gallops and was placed on diltiazem 180 mg twice a day for rate control. He used his CPAP continuously. It was possible today to obtain a download from his machine and he is indeed 100% compliant over Brian last 30 days with an average Myers time of 8 hours and 90 minutes. This CPAP is currently set at 12 cm water with 3 cm EPR. His residual AHI is 2.9 which is excellent. Brian Myers still receives supplies for his CPAP through our [facility]. However he is due for a new machine his current machine is over 52 years old.  Sleep habits are as follows:Brian Myers is now retired. He goes to bed around 11 PM and rises between 7 and 7:30 AM. He feels that he gets sufficient sleep. While using CPAP he  has not been snoring and has not suffered from any further apnea. He continues to be a restless sleeper according to his wife's reports. He endorsed Brian Epworth sleepiness score at 5 points fatigue severity score at 23 points. He sleeps  On his side and one pillow, he has 1 or 2 bathroom breaks. He wakes without headaches or pain,  Sleep medical history:  diagnosed with OSA at AHI 50 in 2008.   Social history: married, non-smoker , non drinker, caffeine ; 1 cup of tea in AM.   Review of Systems: Out of a complete 14 system review, Brian patient complains of only Brian following symptoms, and all other reviewed systems are negative. Epworth score was endorsed at 5 points fatigue severity score at 23 points, a depression score 1 out of 15 points was endorsed. No clinical evidence of depression. Brian Myers reports mild breakthrough snoring at times.  Social History   Social History  . Marital Status: Married    Spouse Name: N/A  . Number of Children: 2  . Years of Education: N/A   Occupational History  .  [employer]   Social History Main Topics  . Smoking status: Never Smoker   . Smokeless tobacco: Never Used  . Alcohol Use: 0.0 oz/week     Comment: at least 2-3 glasses of wine per day  . Drug  Use: No  . Sexual Activity: Not on file   Other Topics Concern  . Not on file   Social History Narrative   Lives in [place] with spouse.  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    Family History  Problem Relation Age of Onset  . Arrhythmia Mother     atrial fib  . Hypertension Father   . Mitral valve prolapse Mother     Past Medical History  Diagnosis Date  . Asthma   . Hyperlipidemia   . Sleep apnea     with cpap  . GERD (gastroesophageal reflux disease)   . Persistent atrial fibrillation (HCC)     Dr Tamala Julian  . Shortness of breath     WHEN HEART BEAT IRREGULAR  . Pneumonia     LAST EPISODE JAN 2015  . Arthritis     KNEES  . Right knee meniscal tear     PAIN  RT KNEE  . BPH (benign prostatic hypertrophy)     DIFFICULTY GETTING STREAM STARTED  . Eczema     HANDS  . Hyperlipidemia     DOES NOT TOLERATE ANY OF Brian STATINS  . H/O hiatal hernia   . Dysrhythmia     ATRIAL FIB AND FLUTTER-DR. Linard Millers PT STATES HIS HEART IS USUALLY IN RHYTHM - BUT FOR PAST 10 DAYS OR MORE - HE HAS EXPERIENCED PALPITATIONS AND BREATHLESSNESS - HE HAS CALLED DR. Lemmie Evens [facility] OFFICE     Past Surgical History  Procedure Laterality Date  . Cervical fusion  1991  . Left knee arthroscpy  2009  . Knee arthroscopy Right 04/17/2013    Procedure: ARTHROSCOPY RIGHT KNEE WITH MEDIAL AND LATERAL DEBRIDEMENT AND CONDRAPLASTY;  Surgeon: Gearlean Alf, MD;  Location: [facility];  Service: Orthopedics;  Laterality: Right;    Current Outpatient Prescriptions  Medication Sig Dispense Refill  . albuterol (PROVENTIL HFA;VENTOLIN HFA) 108 (90 BASE) MCG/ACT inhaler Inhale 2 puffs into Brian lungs every 6 (six) hours as needed for wheezing or shortness of breath.     . budesonide (PULMICORT) 180 MCG/ACT inhaler Inhale 1 puff into Brian lungs 2 (two) times daily.     Marland Kitchen diltiazem (CARDIZEM CD) 360 MG 24 hr capsule TAKE ONE CAPSULE BY MOUTH EVERY EVENING 90 capsule 1  . flecainide (TAMBOCOR) 100 MG tablet TAKE 1 TABLET BY MOUTH TWICE DAILY 180 tablet 0  . fluticasone (FLONASE) 50 MCG/ACT nasal spray     . methylcellulose (ARTIFICIAL TEARS) 1 % ophthalmic solution Place 1 drop into both eyes 2 (two) times daily as needed (dry eyes).    . pantoprazole (PROTONIX) 40 MG tablet     . PRADAXA 150 MG CAPS capsule TAKE 1 CAPSULE BY MOUTH EVERY 12 HOURS 60 capsule 4  . salmeterol (SEREVENT) 50 MCG/DOSE diskus inhaler Inhale 1 puff into Brian lungs 2 (two) times daily.      No current facility-administered medications for this visit.    Allergies as of 08/04/2015 - Review Complete 08/04/2015  Allergen Reaction Noted  . Penicillins Anaphylaxis   . Sulfa drugs cross reactors Anaphylaxis   . Statins   04/12/2013    Vitals: BP 152/82 mmHg  Pulse 66  Resp 20  Ht 5' 11"  (1.803 m)  Wt 274 lb (124.286 kg)  BMI 38.23 kg/m2 Last Weight:  Wt Readings from Last 1 Encounters:  08/04/15 274 lb (124.286 kg)   ION:GEXB mass index is 38.23 kg/(m^2).     Last Height:   Ht Readings  from Last 1 Encounters:  08/04/15 5' 11"  (1.803 m)    Physical exam:  General: Brian patient is awake, alert and appears not in acute distress. Brian patient is well groomed. Head: Normocephalic, atraumatic. Neck is supple. Mallampati  2 neck circumference: 19 . Nasal airflow patent  . Prognathia is seen.  Cardiovascular:  Regular rate and rhythm without  murmurs or carotid bruit, and without distended neck veins. Respiratory: Lungs are clear to auscultation. Skin:  Without evidence of edema, or rash Trunk: BMI is elevated . Brian patient's posture is erect    Neurologic exam : Brian patient is awake and alert, oriented to place and time.   Memory subjective  described as intact.  Attention span & concentration ability appears normal.  Speech is fluent,  without  dysarthria, dysphonia or aphasia.  Mood and affect are appropriate.  Cranial nerves: Pupils are equal and briskly reactive to light. . Extraocular movements  in vertical and horizontal planes intact and without nystagmus. Visual fields by finger perimetry are intact. Hearing to finger rub intact.   Facial sensation intact to fine touch.  Facial motor strength is symmetric and tongue and uvula move midline. Shoulder shrug was symmetrical.   Motor exam: Normal tone, muscle bulk and symmetric strength in all extremities. Deep tendon reflexes: in Brian  upper and lower extremities are symmetric and intact. Babinski maneuver response is downgoing.  Brian patient was advised of Brian nature of Brian diagnosed sleep disorder , Brian treatment options and risks for general a health and wellness arising from not treating Brian condition.  I spent more than 35 minutes of face  to face time with Brian patient. Greater than 50% of time was spent in counseling and coordination of care. We have discussed Brian diagnosis and differential and I answered Brian patient's questions.     Assessment:  After physical and neurologic examination, review of laboratory studies,  Personal review of imaging studies, reports of other /same  Imaging studies ,  Results of polysomnography/ neurophysiology testing and pre-existing records as far as provided in visit., my assessment is   1) Brian Myers, and his machine has aged according to use. I would like to prescribe a new CPAP for him, for this I have to document that he still has Brian need of CPAP. I will invite him therefore for a CPAP re-titration was 1 hour baseline. My goal is to document his current level of apnea as his body mass index has changed since his last sleep studies, and then Titrate him to Brian pressure level that is most beneficial for his degree of apnea. I hope he can do this study was in Brian next 14 days.  He is using a FFM, ResMed S 10 or dream station will be provided, heated humidity.   Plan:  Treatment plan and additional workup :  Needs baseline and retitration. He needs CPAP to sleep. That's why I will allow Brian tech to ut CPAP on if Brian patient cannot initiate sleep.    Asencion Partridge Osmany Azer MD  08/04/2015   CC: Leighton Ruff, [place],  [id number]

## 2015-08-12 ENCOUNTER — Other Ambulatory Visit: Payer: Self-pay | Admitting: Interventional Cardiology

## 2015-08-17 ENCOUNTER — Institutional Professional Consult (permissible substitution): Payer: Medicare Other | Admitting: Neurology

## 2015-08-25 ENCOUNTER — Other Ambulatory Visit: Payer: Self-pay | Admitting: Interventional Cardiology

## 2015-08-28 ENCOUNTER — Ambulatory Visit (INDEPENDENT_AMBULATORY_CARE_PROVIDER_SITE_OTHER): Payer: Medicare Other | Admitting: Neurology

## 2015-08-28 DIAGNOSIS — G4733 Obstructive sleep apnea (adult) (pediatric): Secondary | ICD-10-CM

## 2015-08-28 DIAGNOSIS — Z9989 Dependence on other enabling machines and devices: Principal | ICD-10-CM

## 2015-09-11 ENCOUNTER — Other Ambulatory Visit: Payer: Self-pay | Admitting: Interventional Cardiology

## 2015-09-13 ENCOUNTER — Other Ambulatory Visit: Payer: Self-pay | Admitting: Interventional Cardiology

## 2015-09-15 ENCOUNTER — Telehealth: Payer: Self-pay

## 2015-09-15 NOTE — Telephone Encounter (Signed)
I spoke to pt regarding his sleep study results. I advised him that his study was difficult and an optimal treatment pressure was not identified in this study. Dr. Brett Fairy recommends a full-night attended bipap titration study with oxygen titration. I advised pt that there were plenty of PLMS with associated sleep disruption and treatment can be considered if they fail to resolve with PAP therapy. I advised pt to avoid caffeine containing beverages and chocolate. Pt says that he would rather discuss this further with Dr. Brett Fairy before deciding on any further treatment. An appt was made for 09/28/15 at 9:30. Pt verbalized understanding of results. Pt had no questions at this time but was encouraged to call back if questions arise.

## 2015-09-17 DIAGNOSIS — G4730 Sleep apnea, unspecified: Secondary | ICD-10-CM | POA: Diagnosis not present

## 2015-09-17 DIAGNOSIS — D649 Anemia, unspecified: Secondary | ICD-10-CM | POA: Diagnosis not present

## 2015-09-17 DIAGNOSIS — R7309 Other abnormal glucose: Secondary | ICD-10-CM | POA: Diagnosis not present

## 2015-09-17 DIAGNOSIS — R7301 Impaired fasting glucose: Secondary | ICD-10-CM | POA: Diagnosis not present

## 2015-09-17 DIAGNOSIS — E7800 Pure hypercholesterolemia, unspecified: Secondary | ICD-10-CM | POA: Diagnosis not present

## 2015-09-17 DIAGNOSIS — R7303 Prediabetes: Secondary | ICD-10-CM | POA: Diagnosis not present

## 2015-09-21 ENCOUNTER — Other Ambulatory Visit: Payer: Self-pay | Admitting: Interventional Cardiology

## 2015-09-28 ENCOUNTER — Ambulatory Visit (INDEPENDENT_AMBULATORY_CARE_PROVIDER_SITE_OTHER): Payer: Medicare Other | Admitting: Neurology

## 2015-09-28 ENCOUNTER — Encounter: Payer: Self-pay | Admitting: Neurology

## 2015-09-28 VITALS — BP 166/88 | HR 70 | Resp 20 | Ht 71.0 in | Wt 269.0 lb

## 2015-09-28 DIAGNOSIS — Q674 Other congenital deformities of skull, face and jaw: Secondary | ICD-10-CM | POA: Insufficient documentation

## 2015-09-28 DIAGNOSIS — G4733 Obstructive sleep apnea (adult) (pediatric): Secondary | ICD-10-CM | POA: Diagnosis not present

## 2015-09-28 DIAGNOSIS — Z9989 Dependence on other enabling machines and devices: Secondary | ICD-10-CM

## 2015-09-28 DIAGNOSIS — E669 Obesity, unspecified: Secondary | ICD-10-CM | POA: Diagnosis not present

## 2015-09-28 DIAGNOSIS — G4761 Periodic limb movement disorder: Secondary | ICD-10-CM

## 2015-09-28 DIAGNOSIS — G4700 Insomnia, unspecified: Secondary | ICD-10-CM

## 2015-09-28 NOTE — Patient Instructions (Signed)
Order for cpap/bipap sent to [facility] per Dr. Brett Fairy request.

## 2015-09-28 NOTE — Progress Notes (Signed)
SLEEP MEDICINE CLINIC   Provider:  Larey Seat, M D  Referring Provider: Leighton Ruff, MD Primary Care Physician:  Gerrit Heck, MD  Chief Complaint  Patient presents with  . Follow-up    sleep study results, not sure about bipap titration, low oxygen levels    HPI:  Brian Myers is a 70 y.o. male , seen here as a referral from Dr. Drema Dallas for re-establishing this patient for CPAP care.    Chief complaint according to patient : Originally I met Brian Myers on the fifth of February  2008. He was referred directly by Dr. Sheryn Bison for a sleep study , which was then followed by a CPAP titration. His diagnostic polysomnogram documented an AHI of 50. He was titrated to CPAP at 10 cm water setting, he did have some periodic limb movements. He has used CPAP ever since. After his titration at [facility] he has been followed in the office in 2008 2010 in 2011. His BMI exceeded 35 in his last visit he had also a slightly elevated blood pressure but he did not feel excessively daytime sleepy any longer. At the time he was still employed at [employer]. He was diagnosed with atrial fibrillation and treated for stroke prevention was Pradaxa he continued to feel some palpitations, some gallops and was placed on diltiazem 180 mg twice a day for rate control. He used his CPAP continuously. It was possible today to obtain a download from his machine and he is indeed 100% compliant over the last 30 days with an average user time of 8 hours and 90 minutes. This CPAP is currently set at 12 cm water with 3 cm EPR. His residual AHI is 2.9 which is excellent. Mr. Brian Myers still receives supplies for his CPAP through our [facility]. However he is due for a new machine his current machine is over 15 years old.  Sleep habits are as follows:The Brian Myers is now retired. He goes to bed around 11 PM and rises between 7 and 7:30 AM. He feels that he gets sufficient sleep. While using  CPAP he has not been snoring and has not suffered from any further apnea. He continues to be a restless sleeper according to his wife's reports. He endorsed the Epworth sleepiness score at 5 points fatigue severity score at 23 points. He sleeps on his side and one pillow, he has 1 or 2 bathroom breaks. He wakes without headaches or pain.   09-28-2015, Mr. Brian Myers is seen year today for a revisit, following a split-night titration polysomnography dated 08/28/2015. The study documented rather severe insomnia with 360 minutes of wake time after sleep onset. REM sleep latency was 434 minutes. He is not used to early night time, he felt the room was hot and the humidity not high enough/   In the brief recorded sleep. Apnea was verified with an AHI of 22 per hour and the patient presented with continued hypoxemia by CPAP was increased to 14 cm water. The AHI at the setting was 2.1 per hour it never reached 0. In addition the patient had significant periodic limb movements and twitches at night which led to admission a high arousal index of 15 per hour. I advised to start an oxygen titration was 1-2 L  titrated to BiPAP.  The patient reports that at home he sleeps much better which would lead me to believe that we should do an outer titration in the comfort of his home for 30 days.  He would much likely a get reliable results this way. He advised me that he restarted asthma medications. I like for the patient to use oxygen over night.  His leg movements have increased, lower back problems, but helped massage and icing. He feels he needs no additional medication.    Sleep medical history:  diagnosed with OSA at AHI 50 in 2008.  Social history: married, non-smoker , non drinker, caffeine ; 1 cup of tea in AM.   Review of Systems: Out of a complete 14 system review, the patient complains of only the following symptoms, and all other reviewed systems are negative. Epworth score was endorsed at 5 points fatigue  severity score at 23 points, a depression score 1 out of 15 points was endorsed.  No clinical evidence of depression. Mrs. Carras reports mild breakthrough snoring at times.  Social History   Social History  . Marital status: Married    Spouse name: N/A  . Number of children: 2  . Years of education: N/A   Occupational History  .  [employer]   Social History Main Topics  . Smoking status: Never Smoker  . Smokeless tobacco: Never Used  . Alcohol use 0.0 oz/week     Comment: at least 2-3 glasses of wine per day  . Drug use: No  . Sexual activity: Not on file   Other Topics Concern  . Not on file   Social History Narrative   Lives in [place] with spouse.  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    Family History  Problem Relation Age of Onset  . Arrhythmia Mother     atrial fib  . Hypertension Father   . Mitral valve prolapse Mother     Past Medical History:  Diagnosis Date  . Arthritis    KNEES  . Asthma   . BPH (benign prostatic hypertrophy)    DIFFICULTY GETTING STREAM STARTED  . Dysrhythmia    ATRIAL FIB AND FLUTTER-DR. Linard Millers PT STATES HIS HEART IS USUALLY IN RHYTHM - BUT FOR PAST 10 DAYS OR MORE - HE HAS EXPERIENCED PALPITATIONS AND BREATHLESSNESS - HE HAS CALLED DR. Lemmie Evens. [facility] OFFICE   . Eczema    HANDS  . GERD (gastroesophageal reflux disease)   . H/O hiatal hernia   . Hyperlipidemia   . Hyperlipidemia    DOES NOT TOLERATE ANY OF THE STATINS  . Persistent atrial fibrillation (HCC)    Dr Tamala Julian  . Pneumonia    LAST EPISODE JAN 2015  . Right knee meniscal tear    PAIN RT KNEE  . Shortness of breath    WHEN HEART BEAT IRREGULAR  . Sleep apnea    with cpap    Past Surgical History:  Procedure Laterality Date  . CERVICAL FUSION  1991  . KNEE ARTHROSCOPY Right 04/17/2013   Procedure: ARTHROSCOPY RIGHT KNEE WITH MEDIAL AND LATERAL DEBRIDEMENT AND CONDRAPLASTY;  Surgeon: Gearlean Alf, MD;  Location: [facility];  Service: Orthopedics;   Laterality: Right;  . LEFT KNEE ARTHROSCPY  2009    Current Outpatient Prescriptions  Medication Sig Dispense Refill  . albuterol (PROVENTIL HFA;VENTOLIN HFA) 108 (90 BASE) MCG/ACT inhaler Inhale 2 puffs into the lungs every 6 (six) hours as needed for wheezing or shortness of breath.     . budesonide (PULMICORT) 180 MCG/ACT inhaler Inhale 1 puff into the lungs 2 (two) times daily.     Marland Kitchen diltiazem (CARDIZEM CD) 360 MG 24 hr capsule TAKE 1  CAPSULE(360 MG) BY MOUTH EVERY EVENING 30 capsule 2  . flecainide (TAMBOCOR) 100 MG tablet TAKE 1 TABLET BY MOUTH TWICE DAILY 60 tablet 2  . fluticasone (FLONASE) 50 MCG/ACT nasal spray     . methylcellulose (ARTIFICIAL TEARS) 1 % ophthalmic solution Place 1 drop into both eyes 2 (two) times daily as needed (dry eyes).    . pantoprazole (PROTONIX) 40 MG tablet     . PRADAXA 150 MG CAPS capsule TAKE 1 CAPSULE BY MOUTH EVERY 12 HOURS 60 capsule 2  . salmeterol (SEREVENT) 50 MCG/DOSE diskus inhaler Inhale 1 puff into the lungs 2 (two) times daily.      No current facility-administered medications for this visit.     Allergies as of 09/28/2015 - Review Complete 09/28/2015  Allergen Reaction Noted  . Penicillins Anaphylaxis   . Sulfa drugs cross reactors Anaphylaxis   . Statins  04/12/2013    Vitals: BP (!) 166/88   Pulse 70   Resp 20   Ht 5' 11" (1.803 m)   Wt 269 lb (122 kg)   BMI 37.52 kg/m  Last Weight:  Wt Readings from Last 1 Encounters:  09/28/15 269 lb (122 kg)   UYQ:IHKV mass index is 37.52 kg/m.     Last Height:   Ht Readings from Last 1 Encounters:  09/28/15 5' 11" (1.803 m)    Physical exam:  General: The patient is awake, alert and appears not in acute distress. The patient is well groomed. Head: Normocephalic, atraumatic. Neck is supple. Mallampati  2 neck circumference: 19 . Nasal airflow patent  . Prognathia is seen.  Cardiovascular:  Regular rate and rhythm without  murmurs or carotid bruit, and without distended neck  veins. Respiratory: Lungs are clear to auscultation. Skin:  Without evidence of edema, or rash Trunk: BMI is elevated . The patient's posture is erect    Neurologic exam : The patient is awake and alert, oriented to place and time.   Memory subjective  described as intact.  Attention span & concentration ability appears normal.  Speech is fluent,  without  dysarthria, dysphonia or aphasia.  Mood and affect are appropriate.  Cranial nerves: Pupils are equal and briskly reactive to light. Extraocular movements  in vertical and horizontal planes intact and without nystagmus. Visual fields by finger perimetry are intact. Hearing to finger rub intact.   Facial sensation intact to fine touch.  Facial motor strength is symmetric and tongue and uvula move midline. Shoulder shrug was symmetrical.   Motor exam: Normal tone, muscle bulk and symmetric strength in all extremities. Deep tendon reflexes: in the  upper and lower extremities are symmetric and intact. Babinski maneuver response is downgoing.  The patient was advised of the nature of the diagnosed sleep disorder , the treatment options and risks for general a health and wellness arising from not treating the condition.  I spent more than 35 minutes of face to face time with the patient. Greater than 50% of time was spent in counseling and coordination of care. We have discussed the diagnosis and differential and I answered the patient's questions.     Assessment:  After physical and neurologic examination, review of laboratory studies,  Personal review of imaging studies, reports of other /same  Imaging studies ,  Results of polysomnography/ neurophysiology testing and pre-existing records as far as provided in visit., my assessment is   1) Mr. Brian Myers has been a compliant 100% compliant CPAP user, and his machine has aged according to  use. I would like to prescribe a new BIPAP/ CPAP auto function for him. His baseline AHI was still 22 /hr.  He couldn't sleep well in the lab, and desires to use this as an auto titration at home. Co-morbidities are obesity, asthma and PLMs.  Documented that he still has the need of CPAP.  He is using a FFM, ResMed S 10 or dream station will be provided, heated humidity.   Plan:  Treatment plan and additional workup :  I like for Mr. Dorey to get an auto-titrator 5 through 14 with 3 cm flex or airsense.  Needs to be capable of BiPAP setting.  ResMed smallest, newest machine. RV in 60-90 days with data .      Asencion Partridge Dohmeier MD  09/28/2015   CC: Leighton Ruff, [place], [place] [id number]

## 2015-10-02 DIAGNOSIS — K219 Gastro-esophageal reflux disease without esophagitis: Secondary | ICD-10-CM | POA: Diagnosis not present

## 2015-10-02 DIAGNOSIS — I10 Essential (primary) hypertension: Secondary | ICD-10-CM | POA: Diagnosis not present

## 2015-10-02 DIAGNOSIS — J45909 Unspecified asthma, uncomplicated: Secondary | ICD-10-CM | POA: Diagnosis not present

## 2015-10-02 DIAGNOSIS — E785 Hyperlipidemia, unspecified: Secondary | ICD-10-CM | POA: Diagnosis not present

## 2015-10-02 DIAGNOSIS — R7303 Prediabetes: Secondary | ICD-10-CM | POA: Diagnosis not present

## 2015-10-05 ENCOUNTER — Encounter: Payer: Self-pay | Admitting: Interventional Cardiology

## 2015-10-05 ENCOUNTER — Telehealth: Payer: Self-pay | Admitting: Neurology

## 2015-10-05 DIAGNOSIS — G4733 Obstructive sleep apnea (adult) (pediatric): Secondary | ICD-10-CM

## 2015-10-05 DIAGNOSIS — Z9989 Dependence on other enabling machines and devices: Principal | ICD-10-CM

## 2015-10-05 NOTE — Telephone Encounter (Signed)
Pt called in and says that he started to use cpap on Friday and says that he feels like he is not getting enough air. Can the pressure be increased? Please call and advise [phone]

## 2015-10-06 NOTE — Telephone Encounter (Signed)
I cannot find pt's cpap data on either encoreanywhere or airview. I see that he used to be set at 12 cm H2O but the new order is for 5-14 cm H2O. I have asked [facility] to fax me a therapy report for this pt.

## 2015-10-07 NOTE — Telephone Encounter (Signed)
I cannot understand why the auto titrator wouldn't work for him, can he come in for a PAP Nap.

## 2015-10-07 NOTE — Telephone Encounter (Signed)
I spoke to pt and advised him that Dr. Brett Fairy recommended shortening the RAMP time. Pt says that he does not have the RAMP on. (I checked airview and it does say that the RAMP is off.) Pt would really like the 5-14 cm H2O increased. His old cpap was set at 12 cm H2O. Pt has gone back to using his old cpap because "I can't sleep with the new one."

## 2015-10-08 NOTE — Telephone Encounter (Signed)
I will increase the pressure to start at 7 cm water and RAMp to 12,

## 2015-10-08 NOTE — Telephone Encounter (Signed)
I dont think a papnap will help since he does not struggle with cpap. Hes not used to low pressures and feels he is not getting enough air. Could we set him at cpap 12 and get download. Then if we need to adjust settings we can.

## 2015-10-08 NOTE — Telephone Encounter (Signed)
I spoke to pt and advised him that I sent the order for a pressure change to [facility]. Pt verbalized understanding.

## 2015-10-08 NOTE — Telephone Encounter (Signed)
Order placed and sent to [facility].

## 2015-10-08 NOTE — Addendum Note (Signed)
Addended by: Lester [place] A on: 10/08/2015 01:15 PM   Modules accepted: Orders

## 2015-10-12 NOTE — Telephone Encounter (Signed)
[facility] cannot accept the order for the "7 cm and ramp to 12 cm H2O".  This is incorrect/confusing verbiage. I changed the order.

## 2015-10-12 NOTE — Addendum Note (Signed)
Addended by: Lester [place] A on: 10/12/2015 10:35 AM   Modules accepted: Orders

## 2015-11-12 ENCOUNTER — Other Ambulatory Visit: Payer: Self-pay | Admitting: Interventional Cardiology

## 2015-11-12 NOTE — Telephone Encounter (Signed)
flecainide (TAMBOCOR) 100 MG tablet  Medication  Date: 09/21/2015 Department: [facility] Ordering/Authorizing: Belva Crome, MD  Order Providers   Prescribing Provider Encounter Provider  Belva Crome, MD Belva Crome, MD  Medication Detail    Disp Refills Start End   flecainide [facility]) 100 MG tablet 60 tablet 2 09/21/2015    Sig: TAKE 1 TABLET BY MOUTH TWICE DAILY   E-Prescribing Status: Receipt confirmed by pharmacy (09/21/2015 12:21 PM EDT)   Pharmacy   [id number] - [place], [place] - [id number] AT [address]

## 2015-11-24 DIAGNOSIS — Z86010 Personal history of colonic polyps: Secondary | ICD-10-CM | POA: Diagnosis not present

## 2015-11-24 DIAGNOSIS — D509 Iron deficiency anemia, unspecified: Secondary | ICD-10-CM | POA: Diagnosis not present

## 2015-11-27 ENCOUNTER — Ambulatory Visit (INDEPENDENT_AMBULATORY_CARE_PROVIDER_SITE_OTHER): Payer: Medicare Other | Admitting: Interventional Cardiology

## 2015-11-27 ENCOUNTER — Encounter: Payer: Self-pay | Admitting: Interventional Cardiology

## 2015-11-27 ENCOUNTER — Encounter (INDEPENDENT_AMBULATORY_CARE_PROVIDER_SITE_OTHER): Payer: Self-pay

## 2015-11-27 VITALS — BP 134/72 | HR 59 | Ht 71.0 in | Wt 277.6 lb

## 2015-11-27 DIAGNOSIS — I10 Essential (primary) hypertension: Secondary | ICD-10-CM | POA: Diagnosis not present

## 2015-11-27 DIAGNOSIS — Z9989 Dependence on other enabling machines and devices: Secondary | ICD-10-CM

## 2015-11-27 DIAGNOSIS — E785 Hyperlipidemia, unspecified: Secondary | ICD-10-CM

## 2015-11-27 DIAGNOSIS — I480 Paroxysmal atrial fibrillation: Secondary | ICD-10-CM

## 2015-11-27 DIAGNOSIS — Z79899 Other long term (current) drug therapy: Secondary | ICD-10-CM

## 2015-11-27 DIAGNOSIS — Z5181 Encounter for therapeutic drug level monitoring: Secondary | ICD-10-CM

## 2015-11-27 DIAGNOSIS — G4733 Obstructive sleep apnea (adult) (pediatric): Secondary | ICD-10-CM

## 2015-11-27 LAB — BASIC METABOLIC PANEL
BUN: 13 mg/dL (ref 7–25)
CO2: 22 mmol/L (ref 20–31)
Calcium: 8.7 mg/dL (ref 8.6–10.3)
Chloride: 104 mmol/L (ref 98–110)
Creat: 0.98 mg/dL (ref 0.70–1.25)
Glucose, Bld: 98 mg/dL (ref 65–99)
Potassium: 3.5 mmol/L (ref 3.5–5.3)
Sodium: 141 mmol/L (ref 135–146)

## 2015-11-27 LAB — HEMOGLOBIN: Hemoglobin: 14 g/dL (ref 13.2–17.1)

## 2015-11-27 NOTE — Patient Instructions (Signed)
Medication Instructions:  Your physician recommends that you continue on your current medications as directed. Please refer to the Current Medication list given to you today.   Labwork: Bmet, Hemoglobin today  Testing/Procedures: None ordered  Follow-Up: Your physician wants you to follow-up in: 1 year with Dr.Smith You will receive a reminder letter in the mail two months in advance. If you don't receive a letter, please call our office to schedule the follow-up appointment.   Any Other Special Instructions Will Be Listed Below (If Applicable).     If you need a refill on your cardiac medications before your next appointment, please call your pharmacy.

## 2015-11-27 NOTE — Progress Notes (Signed)
Cardiology Office Note    Date:  11/27/2015   ID:  Brian Myers, DOB 1945-08-06, MRN [id number]  PCP:  Gerrit Heck, MD  Cardiologist: Sinclair Grooms, MD   Chief Complaint  Patient presents with  . Atrial Fibrillation    History of Present Illness:  Brian Myers is a 70 y.o. male who presents for paroxysmal atrial fibrillation, hypertension, sleep apnea, and chronic anticoagulation therapy.  Occasional palpitations lasting less than 10 minutes.   Past Medical History:  Diagnosis Date  . Arthritis    KNEES  . Asthma   . BPH (benign prostatic hypertrophy)    DIFFICULTY GETTING STREAM STARTED  . Dysrhythmia    ATRIAL FIB AND FLUTTER-DR. Linard Millers PT STATES HIS HEART IS USUALLY IN RHYTHM - BUT FOR PAST 10 DAYS OR MORE - HE HAS EXPERIENCED PALPITATIONS AND BREATHLESSNESS - HE HAS CALLED DR. Lemmie Evens. [facility]'S OFFICE   . Eczema    HANDS  . GERD (gastroesophageal reflux disease)   . H/O hiatal hernia   . Hyperlipidemia   . Hyperlipidemia    DOES NOT TOLERATE ANY OF THE STATINS  . Persistent atrial fibrillation (HCC)    Dr Tamala Julian  . Pneumonia    LAST EPISODE JAN 2015  . Right knee meniscal tear    PAIN RT KNEE  . Shortness of breath    WHEN HEART BEAT IRREGULAR  . Sleep apnea    with cpap    Past Surgical History:  Procedure Laterality Date  . CERVICAL FUSION  1991  . KNEE ARTHROSCOPY Right 04/17/2013   Procedure: ARTHROSCOPY RIGHT KNEE WITH MEDIAL AND LATERAL DEBRIDEMENT AND CONDRAPLASTY;  Surgeon: Gearlean Alf, MD;  Location: [facility];  Service: Orthopedics;  Laterality: Right;  . LEFT KNEE ARTHROSCPY  2009    Current Medications: Outpatient Medications Prior to Visit  Medication Sig Dispense Refill  . albuterol (PROVENTIL HFA;VENTOLIN HFA) 108 (90 BASE) MCG/ACT inhaler Inhale 2 puffs into the lungs every 6 (six) hours as needed for wheezing or shortness of breath.     . budesonide (PULMICORT) 180 MCG/ACT inhaler Inhale 1 puff into the lungs 2  (two) times daily.     Marland Kitchen diltiazem (CARDIZEM CD) 360 MG 24 hr capsule TAKE 1 CAPSULE(360 MG) BY MOUTH EVERY EVENING 30 capsule 2  . flecainide (TAMBOCOR) 100 MG tablet TAKE 1 TABLET BY MOUTH TWICE DAILY 60 tablet 0  . fluticasone (FLONASE) 50 MCG/ACT nasal spray     . methylcellulose (ARTIFICIAL TEARS) 1 % ophthalmic solution Place 1 drop into both eyes 2 (two) times daily as needed (dry eyes).    Marland Kitchen PRADAXA 150 MG CAPS capsule TAKE 1 CAPSULE BY MOUTH EVERY 12 HOURS 60 capsule 2  . salmeterol (SEREVENT) 50 MCG/DOSE diskus inhaler Inhale 1 puff into the lungs 2 (two) times daily.     Marland Kitchen diltiazem (CARDIZEM CD) 360 MG 24 hr capsule TAKE 1 CAPSULE(360 MG) BY MOUTH EVERY EVENING 30 capsule 0  . flecainide (TAMBOCOR) 100 MG tablet TAKE 1 TABLET BY MOUTH TWICE DAILY 60 tablet 2  . pantoprazole (PROTONIX) 40 MG tablet      No facility-administered medications prior to visit.      Allergies:   Penicillins; Sulfa drugs cross reactors; and Statins   Social History   Social History  . Marital status: Married    Spouse name: N/A  . Number of children: 2  . Years of education: N/A   Occupational History  .  [employer]   Social History Main Topics  . Smoking status: Never Smoker  . Smokeless tobacco: Never Used  . Alcohol use 0.0 oz/week     Comment: at least 2-3 glasses of wine per day  . Drug use: No  . Sexual activity: Not Asked   Other Topics Concern  . None   Social History Narrative   Lives in [place] with spouse.  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     Family History:  The patient's family history includes Arrhythmia in his mother; Hypertension in his father; Mitral valve prolapse in his mother.   ROS:   Please see the history of present illness.    Blood in stool microscopic. Palpitations.  All other systems reviewed and are negative.   PHYSICAL EXAM:   VS:  BP 134/72   Pulse (!) 59   Ht 5\' 11"  (1.803 m)   Wt 277 lb 9.6 oz (125.9 kg)   BMI 38.72 kg/m     GEN: Well nourished, well developed, in no acute distress  HEENT: normal  Neck: no JVD, carotid bruits, or masses Cardiac: RRR; no murmurs, rubs, or gallops,no edema  Respiratory:  clear to auscultation bilaterally, normal work of breathing GI: soft, nontender, nondistended, + BS MS: no deformity or atrophy  Skin: warm and dry, no rash Neuro:  Alert and Oriented x 3, Strength and sensation are intact Psych: euthymic mood, full affect  Wt Readings from Last 3 Encounters:  11/27/15 277 lb 9.6 oz (125.9 kg)  09/28/15 269 lb (122 kg)  08/04/15 274 lb (124.3 kg)      Studies/Labs Reviewed:   EKG:  EKG  SB with NSTWAIs small is a right and I'll discuss of the outstretched right ounces of unit he now 4 to 03/30/1934 over over 72 is a little high and she guide hole on the top and lower than what she got on file but it is consistent she uses a contrast and I use a smaller cuff should've been higher  Recent Labs: No results found for requested labs within last 8760 hours.   Lipid Panel No results found for: CHOL, TRIG, HDL, CHOLHDL, VLDL, LDLCALC, LDLDIRECT  Additional studies/ records that were reviewed today include:      ASSESSMENT:    1. Paroxysmal atrial fibrillation (HCC)   2. Essential hypertension   3. OSA on CPAP   4. Hyperlipidemia   5. Encounter for monitoring flecainide therapy      PLAN:  In order of problems listed above:  1. Well controlled on flecainide. No change in therapy. Check basic metabolic panel today. 2. 2 g sodium diet is advocated. Also weight loss and aerobic activity. 3. Continue therapy of obstructive sleep apnea. 4. Management PCP. 5. Will check a magnesium, potassium, and hemoglobin today since the patient is also on Pradaxa.    Medication Adjustments/Labs and Tests Ordered: Current medicines are reviewed at length with the patient today.  Concerns regarding medicines are outlined above.  Medication changes, Labs and Tests ordered today  are listed in the Patient Instructions below. There are no Patient Instructions on file for this visit.   Signed, Sinclair Grooms, MD  11/27/2015 2:34 PM    [facility] [place], [place], [place]  [address] Phone: [phone]; Fax: [phone]

## 2015-12-08 ENCOUNTER — Encounter: Payer: Self-pay | Admitting: *Deleted

## 2015-12-20 ENCOUNTER — Other Ambulatory Visit: Payer: Self-pay | Admitting: Interventional Cardiology

## 2015-12-22 DIAGNOSIS — L918 Other hypertrophic disorders of the skin: Secondary | ICD-10-CM | POA: Diagnosis not present

## 2015-12-22 DIAGNOSIS — D3617 Benign neoplasm of peripheral nerves and autonomic nervous system of trunk, unspecified: Secondary | ICD-10-CM | POA: Diagnosis not present

## 2015-12-22 DIAGNOSIS — L723 Sebaceous cyst: Secondary | ICD-10-CM | POA: Diagnosis not present

## 2015-12-22 DIAGNOSIS — L821 Other seborrheic keratosis: Secondary | ICD-10-CM | POA: Diagnosis not present

## 2015-12-22 DIAGNOSIS — D1801 Hemangioma of skin and subcutaneous tissue: Secondary | ICD-10-CM | POA: Diagnosis not present

## 2015-12-22 DIAGNOSIS — L309 Dermatitis, unspecified: Secondary | ICD-10-CM | POA: Diagnosis not present

## 2015-12-29 DIAGNOSIS — Z23 Encounter for immunization: Secondary | ICD-10-CM | POA: Diagnosis not present

## 2016-01-04 ENCOUNTER — Ambulatory Visit: Payer: Medicare Other | Admitting: Neurology

## 2016-01-23 ENCOUNTER — Other Ambulatory Visit: Payer: Self-pay | Admitting: Interventional Cardiology

## 2016-01-24 ENCOUNTER — Other Ambulatory Visit: Payer: Self-pay | Admitting: Interventional Cardiology

## 2016-01-26 ENCOUNTER — Ambulatory Visit (INDEPENDENT_AMBULATORY_CARE_PROVIDER_SITE_OTHER): Payer: Medicare Other | Admitting: Neurology

## 2016-01-26 ENCOUNTER — Encounter: Payer: Self-pay | Admitting: Neurology

## 2016-01-26 VITALS — BP 127/76 | HR 62 | Resp 20 | Ht 71.0 in | Wt 274.0 lb

## 2016-01-26 DIAGNOSIS — Z7901 Long term (current) use of anticoagulants: Secondary | ICD-10-CM | POA: Diagnosis not present

## 2016-01-26 DIAGNOSIS — G4733 Obstructive sleep apnea (adult) (pediatric): Secondary | ICD-10-CM | POA: Diagnosis not present

## 2016-01-26 DIAGNOSIS — Z9989 Dependence on other enabling machines and devices: Secondary | ICD-10-CM

## 2016-01-26 DIAGNOSIS — I480 Paroxysmal atrial fibrillation: Secondary | ICD-10-CM | POA: Diagnosis not present

## 2016-01-26 NOTE — Patient Instructions (Signed)
Atrial fib  Atrial Fibrillation Introduction Atrial fibrillation is a type of heartbeat that is irregular or fast (rapid). If you have this condition, your heart keeps quivering in a weird (chaotic) way. This condition can make it so your heart cannot pump blood normally. Having this condition gives a person more risk for stroke, heart failure, and other heart problems. There are different types of atrial fibrillation. Talk with your doctor to learn about the type that you have. Follow these instructions at home:  Take over-the-counter and prescription medicines only as told by your doctor.  If your doctor prescribed a blood-thinning medicine, take it exactly as told. Taking too much of it can cause bleeding. If you do not take enough of it, you will not have the protection that you need against stroke and other problems.  Do not use any tobacco products. These include cigarettes, chewing tobacco, and e-cigarettes. If you need help quitting, ask your doctor.  If you have apnea (obstructive sleep apnea), manage it as told by your doctor.  Do not drink alcohol.  Do not drink beverages that have caffeine. These include coffee, soda, and tea.  Maintain a healthy weight. Do not use diet pills unless your doctor says they are safe for you. Diet pills may make heart problems worse.  Follow diet instructions as told by your doctor.  Exercise regularly as told by your doctor.  Keep all follow-up visits as told by your doctor. This is important. Contact a doctor if:  You notice a change in the speed, rhythm, or strength of your heartbeat.  You are taking a blood-thinning medicine and you notice more bruising.  You get tired more easily when you move or exercise. Get help right away if:  You have pain in your chest or your belly (abdomen).  You have sweating or weakness.  You feel sick to your stomach (nauseous).  You notice blood in your throw up (vomit), poop (stool), or pee  (urine).  You are short of breath.  You suddenly have swollen feet and ankles.  You feel dizzy.  Your suddenly get weak or numb in your face, arms, or legs, especially if it happens on one side of your body.  You have trouble talking, trouble understanding, or both.  Your face or your eyelid droops on one side. These symptoms may be an emergency. Do not wait to see if the symptoms will go away. Get medical help right away. Call your local emergency services (911 in the [place] Do not drive yourself to the hospital.  This information is not intended to replace advice given to you by your health care provider. Make sure you discuss any questions you have with your health care provider. Document Released: 11/24/2007 Document Revised: 07/23/2015 Document Reviewed: 06/11/2014  2017 Elsevier

## 2016-01-26 NOTE — Progress Notes (Signed)
SLEEP MEDICINE CLINIC   Provider:  Larey Myers, M D  Referring Provider: Leighton Ruff, MD Primary Care Physician:  Brian Heck, MD  Chief Complaint  Patient presents with  . Follow-up    cpap going well, using [facility]    HPI:  Brian Myers is a 70 y.o. male , seen here as a referral from Dr. Drema Myers for re-establishing this patient for CPAP care.    Chief complaint according to patient : Originally I met Mr. Marrion Myers on the 5th of February  2008. He was referred directly by Dr. Sheryn Myers for a sleep study , which was then followed by a CPAP titration. His diagnostic polysomnogram documented an AHI of 50. He was titrated to CPAP at 10 cm water setting, he did have some periodic limb movements. He has used CPAP ever since. After his titration at [facility] he has been followed in the office in 2008 2010 in 2011. His BMI exceeded 35 in his last visit he had also a slightly elevated blood pressure but he did not feel excessively daytime sleepy any longer. At the time he was still employed at [employer]. He was diagnosed with atrial fibrillation and treated for stroke prevention was Pradaxa he continued to feel some palpitations, some gallops and was placed on diltiazem 180 mg twice a day for rate control. He used his CPAP continuously. It was possible today to obtain a download from his machine and he is indeed 100% compliant over the last 30 days with an average user time of 8 hours and 90 minutes. This CPAP is currently set at 12 cm water with 3 cm EPR. His residual AHI is 2.9 which is excellent. Mr. Brian Myers still receives supplies for his CPAP through our [facility]. However he is due for a new machine his current machine is over 57 years old.  Sleep habits are as follows:The Brian Myers is now retired. He goes to bed around 11 PM and rises between 7 and 7:30 AM. He feels that he gets sufficient sleep. While using CPAP he has not been snoring and has not  suffered from any further apnea. He continues to be a restless sleeper according to his wife's reports. He endorsed the Epworth sleepiness score at 5 points fatigue severity score at 23 points. He sleeps on his side and one pillow, he has 1 or 2 bathroom breaks. He wakes without headaches or pain.   09-28-2015, Mr. Brian Myers is seen year today for a revisit, following a split-night titration polysomnography dated 08/28/2015. The study documented rather severe insomnia with 360 minutes of wake time after sleep onset. REM sleep latency was 434 minutes. He is not used to early night time, he felt the room was hot and the humidity not high enough/   In the brief recorded sleep. Apnea was verified with an AHI of 22 per hour and the patient presented with continued hypoxemia by CPAP was increased to 14 cm water. The AHI at the setting was 2.1 per hour it never reached 0. In addition the patient had significant periodic limb movements and twitches at night which led to admission a high arousal index of 15 per hour. I advised to start an oxygen titration was 1-2 L  titrated to BiPAP.  The patient reports that at home he sleeps much better which would lead me to believe that we should do an outer titration in the comfort of his home for 30 days. He would much likely a get  reliable results this way. He advised me that he restarted asthma medications. I like for the patient to use oxygen over night.  His leg movements have increased, lower back problems, but helped massage and icing. He feels he needs no additional medication.  Sleep medical history:  diagnosed with OSA at AHI 50 in 2008.  Social history: married, non-smoker , non drinker, caffeine ; 1 cup of tea in AM.  Interval history from 01/26/2016, Mr. Brian Myers is doing very well with an outer titrated but he requested the bottom pressure to be lifted from 7-8 cm water pressure and we will lift the maximum pressure from 12-15 cm water. His 95th percentile  pressure need is exactly at the windows maximal. His AHI is 5.1, apnea index is 2.0 and all of these are obstructive in nature. These residual apneas should respond to an elevation in pressure. He has been using the machine 100% of all days 7 hours and 54 minutes on average. I will see Mr. Brian Myers in a year after the adjustment of his auto- titration, his Epworth sleepiness score is endorsed at 4 and his fatigue severity score was not endorsed, his geriatric depression score is endorsed at 1 out of 15 points. His sleep disorder is well controlled.     Review of Systems: Out of a complete 14 system review, the patient complains of only the following symptoms, and all other reviewed systems are negative. Epworth score was endorsed at 5 points fatigue severity score at 23 points, a depression score 1 out of 15 points was endorsed.  No clinical evidence of depression. Mrs. Mahler reports mild breakthrough snoring at times.  Social History   Social History  . Marital status: Married    Spouse name: N/A  . Number of children: 2  . Years of education: N/A   Occupational History  .  [employer]   Social History Main Topics  . Smoking status: Never Smoker  . Smokeless tobacco: Never Used  . Alcohol use 0.0 oz/week     Comment: at least 2-3 glasses of wine per day  . Drug use: No  . Sexual activity: Not on file   Other Topics Concern  . Not on file   Social History Narrative   Lives in [place] with spouse.  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    Family History  Problem Relation Age of Onset  . Arrhythmia Mother     atrial fib  . Mitral valve prolapse Mother   . Hypertension Father     Past Medical History:  Diagnosis Date  . Arthritis    KNEES  . Asthma   . BPH (benign prostatic hypertrophy)    DIFFICULTY GETTING STREAM STARTED  . Dysrhythmia    ATRIAL FIB AND FLUTTER-DR. Linard Millers PT STATES HIS HEART IS USUALLY IN RHYTHM - BUT FOR PAST 10 DAYS OR MORE - HE HAS  EXPERIENCED PALPITATIONS AND BREATHLESSNESS - HE HAS CALLED DR. Lemmie Myers. Brian Myers'S OFFICE   . Eczema    HANDS  . GERD (gastroesophageal reflux disease)   . H/O hiatal hernia   . Hyperlipidemia   . Hyperlipidemia    DOES NOT TOLERATE ANY OF THE STATINS  . Persistent atrial fibrillation (HCC)    Dr Tamala Julian  . Pneumonia    LAST EPISODE JAN 2015  . Right knee meniscal tear    PAIN RT KNEE  . Shortness of breath    WHEN HEART BEAT IRREGULAR  . Sleep apnea  with cpap    Past Surgical History:  Procedure Laterality Date  . CERVICAL FUSION  1991  . KNEE ARTHROSCOPY Right 04/17/2013   Procedure: ARTHROSCOPY RIGHT KNEE WITH MEDIAL AND LATERAL DEBRIDEMENT AND CONDRAPLASTY;  Surgeon: Gearlean Alf, MD;  Location: [facility];  Service: Orthopedics;  Laterality: Right;  . LEFT KNEE ARTHROSCPY  2009    Current Outpatient Prescriptions  Medication Sig Dispense Refill  . albuterol (PROVENTIL HFA;VENTOLIN HFA) 108 (90 BASE) MCG/ACT inhaler Inhale 2 puffs into the lungs every 6 (six) hours as needed for wheezing or shortness of breath.     . budesonide (PULMICORT) 180 MCG/ACT inhaler Inhale 1 puff into the lungs 2 (two) times daily.     Marland Kitchen diltiazem (CARDIZEM CD) 360 MG 24 hr capsule TAKE 1 CAPSULE(360 MG) BY MOUTH EVERY EVENING 30 capsule 2  . diltiazem (CARDIZEM CD) 360 MG 24 hr capsule TAKE 1 CAPSULE(360 MG) BY MOUTH EVERY EVENING 30 capsule 8  . esomeprazole (NEXIUM) 40 MG capsule Take 40 mg by mouth daily.  1  . flecainide (TAMBOCOR) 100 MG tablet TAKE 1 TABLET BY MOUTH TWICE DAILY 60 tablet 0  . flecainide (TAMBOCOR) 100 MG tablet TAKE 1 TABLET BY MOUTH TWICE DAILY 60 tablet 8  . fluticasone (FLONASE) 50 MCG/ACT nasal spray     . methylcellulose (ARTIFICIAL TEARS) 1 % ophthalmic solution Place 1 drop into both eyes 2 (two) times daily as needed (dry eyes).    Marland Kitchen PRADAXA 150 MG CAPS capsule TAKE 1 CAPSULE BY MOUTH EVERY 12 HOURS 60 capsule 10  . salmeterol (SEREVENT) 50 MCG/DOSE diskus inhaler Inhale 1  puff into the lungs 2 (two) times daily.      No current facility-administered medications for this visit.     Allergies as of 01/26/2016 - Review Complete 01/26/2016  Allergen Reaction Noted  . Penicillins Anaphylaxis   . Sulfa drugs cross reactors Anaphylaxis   . Statins  04/12/2013    Vitals: BP 127/76   Pulse 62   Resp 20   Ht _0  (1.803 m)   Wt 274 lb (124.3 kg)   BMI 38.22 kg/m  Last Weight:  Wt Readings from Last 1 Encounters:  01/26/16 274 lb (124.3 kg)   ENI:DPOE mass index is 38.22 kg/m.     Last Height:   Ht Readings from Last 1 Encounters:  01/26/16 _1  (1.803 m)    Physical exam:  General: The patient is awake, alert and appears not in acute distress. The patient is well groomed. Head: Normocephalic, atraumatic. Neck is supple. Mallampati  2 neck circumference: 19 . Nasal airflow patent  . Prognathia is seen.  Patient with a fib, currently in NSR.  Neurologic exam : The patient is awake and alert, oriented to place and time.   Memory subjective  described as intact.  Attention span & concentration ability appears normal.  Speech is fluent,  without  dysarthria, dysphonia or aphasia.   Cranial nerves: Pupils are equal and briskly reactive to light.  Facial sensation intact to fine touch.  Facial motor strength is symmetric and tongue and uvula move midline. Shoulder shrug was symmetrical.   Deep tendon reflexes: in the  upper and lower extremities are symmetric and intact. The patient was advised of the nature of the diagnosed sleep disorder , the treatment options and risks for general a health and wellness arising from not treating the condition.  I spent more than 15 minutes of face to face time with the patient.  Greater than 50% of time was spent in counseling and coordination of care. We have discussed the diagnosis and differential and I answered the patient's questions.     Assessment:  After physical and neurologic examination, review of  laboratory studies,  Personal review of imaging studies, reports of other /same  Imaging studies ,  Results of polysomnography/ neurophysiology testing and pre-existing records as far as provided in visit., my assessment is   1) Mr. Brian Myers has been a compliant 100% compliant CPAP user, he is now using an AutoSet between 7 and 12 cm water pressure with 3 cm EPR, 100% compliance, average user time 7 hours 54 minutes each night, residual apnea index 2.0 all residual apneas are obstructive in nature. The 95th percentile pressure is 12 cm water. Based on this I would like to elevate the upper pressure window to 14 cm to allow some room to go. It seems that he is right now At 12 cm but could do well with another centimeter extra pressure. Co-morbidities are obesity, asthma and PLMs, and atrial fibrillation on chronic anticoagulation.  Documented that he still has the need of CPAP.   He is using a FFM, ResMed S 10 or dream station will be provided, heated humidity.   Plan:  Treatment plan and additional workup :  I like for Mr. Weekes to continue  auto-titrator, further adjustment to  8 cm water  through 15 cm with 3 cm flex or airsense.  RV in [address] Talitha Dicarlo MD  01/26/2016   CC: Brian Myers, [place], [place] [address]

## 2016-02-01 ENCOUNTER — Telehealth: Payer: Self-pay | Admitting: Neurology

## 2016-02-01 NOTE — Telephone Encounter (Signed)
Patient is calling regarding an order that was to be sent to [facility] to change pressures on his CPAP. I asked the patient if he had contacted [facility] regarding this but he says they always tell him to contact our office.

## 2016-02-01 NOTE — Telephone Encounter (Signed)
Brian Myers states that they have the order. They just changed the pressure and will call patient.

## 2016-02-01 NOTE — Telephone Encounter (Signed)
I contacted Heather at [facility] to see if she has them.

## 2016-07-15 DIAGNOSIS — D649 Anemia, unspecified: Secondary | ICD-10-CM | POA: Diagnosis not present

## 2016-07-15 DIAGNOSIS — R7301 Impaired fasting glucose: Secondary | ICD-10-CM | POA: Diagnosis not present

## 2016-07-15 DIAGNOSIS — E7800 Pure hypercholesterolemia, unspecified: Secondary | ICD-10-CM | POA: Diagnosis not present

## 2016-07-15 DIAGNOSIS — E785 Hyperlipidemia, unspecified: Secondary | ICD-10-CM | POA: Diagnosis not present

## 2016-07-21 DIAGNOSIS — I10 Essential (primary) hypertension: Secondary | ICD-10-CM | POA: Diagnosis not present

## 2016-07-21 DIAGNOSIS — R7301 Impaired fasting glucose: Secondary | ICD-10-CM | POA: Diagnosis not present

## 2016-07-21 DIAGNOSIS — Z789 Other specified health status: Secondary | ICD-10-CM | POA: Diagnosis not present

## 2016-07-21 DIAGNOSIS — E7800 Pure hypercholesterolemia, unspecified: Secondary | ICD-10-CM | POA: Diagnosis not present

## 2016-07-21 DIAGNOSIS — E785 Hyperlipidemia, unspecified: Secondary | ICD-10-CM | POA: Diagnosis not present

## 2016-07-21 DIAGNOSIS — D649 Anemia, unspecified: Secondary | ICD-10-CM | POA: Diagnosis not present

## 2016-07-21 DIAGNOSIS — R7303 Prediabetes: Secondary | ICD-10-CM | POA: Diagnosis not present

## 2016-07-21 DIAGNOSIS — J45909 Unspecified asthma, uncomplicated: Secondary | ICD-10-CM | POA: Diagnosis not present

## 2016-07-21 DIAGNOSIS — Z86010 Personal history of colonic polyps: Secondary | ICD-10-CM | POA: Diagnosis not present

## 2016-08-25 ENCOUNTER — Encounter (INDEPENDENT_AMBULATORY_CARE_PROVIDER_SITE_OTHER): Payer: Self-pay

## 2016-08-25 ENCOUNTER — Ambulatory Visit (INDEPENDENT_AMBULATORY_CARE_PROVIDER_SITE_OTHER): Payer: Medicare Other | Admitting: Internal Medicine

## 2016-08-25 ENCOUNTER — Encounter: Payer: Self-pay | Admitting: Internal Medicine

## 2016-08-25 VITALS — BP 142/82 | HR 68 | Ht 71.0 in | Wt 282.2 lb

## 2016-08-25 DIAGNOSIS — I10 Essential (primary) hypertension: Secondary | ICD-10-CM

## 2016-08-25 DIAGNOSIS — I480 Paroxysmal atrial fibrillation: Secondary | ICD-10-CM

## 2016-08-25 NOTE — Patient Instructions (Addendum)
Medication Instructions:    Your physician recommends that you continue on your current medications as directed. Please refer to the Current Medication list given to you today.  - If you need a refill on your cardiac medications before your next appointment, please call your pharmacy.   Labwork:  None ordered  Testing/Procedures: Your physician has requested that you have an echocardiogram. Echocardiography is a painless test that uses sound waves to create images of your heart. It provides your doctor with information about the size and shape of your heart and how well your heart's chambers and valves are working. This procedure takes approximately one hour. There are no restrictions for this procedure.   Follow-Up:  Your physician recommends that you schedule a follow-up appointment in: 8 weeks with Dr. Caryl Comes.   Thank you for choosing [facility]!!     Any Other Special Instructions Will Be Listed Below (If Applicable).

## 2016-08-25 NOTE — Progress Notes (Signed)
ELECTROPHYSIOLOGY CONSULT NOTE  Patient ID: Brian Myers, MRN: [id number], DOB/AGE: 1945-12-18 71 y.o. Admit date: (Not on file) Date of Consult: 08/25/2016  Primary Physician: Leighton Ruff, MD Primary Cardiologist: [facility]      HPI Brian Myers is a 71 y.o. male  Is seen today at his own request regarding thoughts about atrial fibrillation  His history of atrial fibrillation dates back perhaps 15 years with an antecedent history of palpitations. He was finally diagnosed with atrial fibrillation in 2011.   He saw Dr. Greggory Brandy 9/12. At that time he was not felt to be a candidate for ablation therapy as he had not yet failed antiarrhythmic drugs.  Since that he was put on flecainide. This was extremely effective  for some period of time. More recently, he has had more frequent episodes of tachypalpitations. He also has had spells where he feels like he just can't do anything. These episodes last fractions of hours. He is FitBit watch has noted a heart rate in the low 60s; this is distinct from his normal heart rate in the mid to high 70s.   He has treated sleep apnea. He has long-standing hypertension. He has some peripheral edema. He denies exertional chest discomfort.  Remote Myoview was negative  Last echocardiogram in 2011 demonstrated moderate LVH with an EF of 55-60% and moderate left atrial enlargement (4.7)  Date Cr Hgb  9/17 0.98 14.0     Past Medical History:  Diagnosis Date  . Arthritis    KNEES  . Asthma   . BPH (benign prostatic hypertrophy)    DIFFICULTY GETTING STREAM STARTED  . Dysrhythmia    ATRIAL FIB AND FLUTTER-DR. Linard Millers PT STATES HIS HEART IS USUALLY IN RHYTHM - BUT FOR PAST 10 DAYS OR MORE - HE HAS EXPERIENCED PALPITATIONS AND BREATHLESSNESS - HE HAS CALLED DR. Lemmie Evens. [facility] OFF[facility]   . Eczema    HANDS  . GERD (gastroesophageal reflux disease)   . H/O hiatal hernia   . Hyperlipidemia   . Hyperlipidemia    DOES NOT TOLERATE ANY OF THE  STATINS  . Persistent atrial fibrillation (HCC)    Dr Tamala Julian  . Pneumonia    LAST EPISODE JAN 2015  . Right knee meniscal tear    PAIN RT KNEE  . Shortness of breath    WHEN HEART BEAT IRREGULAR  . Sleep apnea    with cpap      Surgical History:  Past Surgical History:  Procedure Laterality Date  . CERVICAL FUSION  1991  . KNEE ARTHROSCOPY Right 04/17/2013   Procedure: ARTHROSCOPY RIGHT KNEE WITH MEDIAL AND LATERAL DEBRIDEMENT AND CONDRAPLASTY;  Surgeon: Gearlean Alf, MD;  Location: [facility];  Service: Orthopedics;  Laterality: Right;  . LEFT KNEE ARTHROSCPY  2009     Home Meds: Prior to Admission medications   Medication Sig Start Date End Date Taking? Authorizing Provider  albuterol (PROVENTIL HFA;VENTOLIN HFA) 108 (90 BASE) MCG/ACT inhaler Inhale 2 puffs into the lungs every 6 (six) hours as needed for wheezing or shortness of breath.    Yes [provider]  budesonide (PULMICORT) 180 MCG/ACT inhaler Inhale 1 puff into the lungs 2 (two) times daily.    Yes [provider]  diltiazem (CARDIZEM CD) 360 MG 24 hr capsule TAKE 1 CAPSULE(360 MG) BY MOUTH EVERY EVENING 09/21/15  Yes Belva Crome, MD  esomeprazole (NEXIUM) 40 MG capsule Take 40 mg by mouth daily. 10/25/15  Yes [provider]  flecainide (TAMBOCOR) 100 MG tablet TAKE 1 TABLET BY MOUTH TWICE DAILY 01/25/16  Yes Belva Crome, MD  fluticasone [facility]) 50 MCG/ACT nasal spray    Yes [provider]  methylcellulose (ARTIFICIAL TEARS) 1 % ophthalmic solution Place 1 drop into both eyes 2 (two) times daily as needed (dry eyes).   Yes [provider]  PRADAXA 150 MG CAPS capsule TAKE 1 CAPSULE BY MOUTH EVERY 12 HOURS 12/21/15  Yes Belva Crome, MD  salmeterol (SEREVENT) 50 MCG/DOSE diskus inhaler Inhale 1 puff into the lungs 2 (two) times daily.    Yes [provider]    Allergies:  Allergies  Allergen Reactions  . Penicillins Anaphylaxis  . Sulfa Drugs Cross  Reactors Anaphylaxis  . Statins     INTOLERANT TO STATINS - SEVERE MUSCLE CRAMPS    Social History   Social History  . Marital status: Married    Spouse name: N/A  . Number of children: 2  . Years of education: N/A   Occupational History  .  [employer]   Social History Main Topics  . Smoking status: Never Smoker  . Smokeless tobacco: Never Used  . Alcohol use 0.0 oz/week     Comment: at least 2-3 glasses of wine per day  . Drug use: No  . Sexual activity: Not on file   Other Topics Concern  . Not on file   Social History Narrative   Lives in [place] with spouse.  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     Family History  Problem Relation Age of Onset  . Arrhythmia Mother        atrial fib  . Mitral valve prolapse Mother   . Hypertension Father      ROS:  Please see the history of present illness.     All other systems reviewed and negative.    Physical Exam:  Blood pressure (!) 142/82, pulse 68, height 5\' 11"  (1.803 m), weight 282 lb 4 oz (128 kg), SpO2 95 %. General: Well developed, well nourished male in no acute distress. Head: Normocephalic, atraumatic, sclera non-icteric, no xanthomas, nares are without discharge. EENT: normal  Lymph Nodes:  none Neck: Negative for carotid bruits. JVD not elevated. Back:without scoliosis kyphosis Lungs: Clear bilaterally to auscultation without wheezes, rales, or rhonchi. Breathing is unlabored. Heart: RRR with soft S1 S2. 2/6 early murmur . No rubs, or gallops appreciated. Abdomen: Soft, non-tender, non-distended with normoactive bowel sounds. No hepatomegaly. No rebound/guarding. No obvious abdominal masses. Msk:  Strength and tone appear normal for age. Extremities: No clubbing or cyanosis. tr  edema.  Distal pedal pulses are 2+ and equal bilaterally. Skin: Warm and Dry Neuro: Alert and oriented X 3. CN III-XII intact Grossly normal sensory and motor function . Psych:  Responds to questions appropriately  with a normal affect.      Labs: Cardiac Enzymes No results for input(s): CKTOTAL, CKMB, TROPONINI in the last 72 hours. CBC Lab Results  Component Value Date   WBC 8.3 04/12/2013   HGB 14.0 11/27/2015   HCT 35.9 (L) 06/07/2013   MCV 83.8 04/12/2013   PLT 297 04/12/2013   PROTIME: No results for input(s): LABPROT, INR in the last 72 hours. Chemistry No results for input(s): NA, K, CL, CO2, BUN, CREATININE, CALCIUM, PROT, BILITOT, ALKPHOS, ALT, AST, GLUCOSE in the last 168 hours.  Invalid input(s): LABALBU Lipids No results found for: CHOL, HDL, LDLCALC, TRIG BNP No results found for:  PROBNP Thyroid Function Tests: No results for input(s): TSH, T4TOTAL, T3FREE, THYROIDAB in the last 72 hours.  Invalid input(s): FREET3 Miscellaneous No results found for: DDIMER  Radiology/Studies:  No results found.  EKG: sinus 68 23/10/27   Assessment and Plan:  Atrial fibrillation paroxsymal  Obesity-morbid  LVH  Exercise intolerance  The patient has paroxysmal atrial fibrillation. This has been well-controlled over the last years with flecainide. There is recent increase in frequency.  He is interested and alternatives.  We have discussed the role of catheter ablation in the wake of recent trials and guidelines. She would now be a candidate for ablation although his weight may be preclusive. In addition, left atrial size 7 years ago is quite  enlarged. We will need to reassess this.  We discussed the importance of weight loss. We have discussed low car alternatives.  I'm also worried about these episodes where he becomes a acutely weak in his heart rate is slow. The mechanism of this is not clear. He will use [employer] monitor to try to elucidate this. We will see him in 8 weeks time  He needs reassessment of Hgb on anticoagulation and Cr for renal function       Virl Axe

## 2016-08-30 ENCOUNTER — Other Ambulatory Visit: Payer: Self-pay

## 2016-08-30 ENCOUNTER — Ambulatory Visit (HOSPITAL_COMMUNITY): Payer: Medicare Other | Attending: Cardiovascular Disease

## 2016-08-30 DIAGNOSIS — I480 Paroxysmal atrial fibrillation: Secondary | ICD-10-CM

## 2016-10-13 ENCOUNTER — Other Ambulatory Visit: Payer: Self-pay | Admitting: Interventional Cardiology

## 2016-10-13 MED ORDER — DILTIAZEM HCL ER COATED BEADS 360 MG PO CP24: ORAL_CAPSULE | ORAL | 1 refills | Status: DC

## 2016-10-28 ENCOUNTER — Ambulatory Visit: Payer: Medicare Other | Admitting: Internal Medicine

## 2016-11-22 ENCOUNTER — Other Ambulatory Visit: Payer: Self-pay | Admitting: Interventional Cardiology

## 2016-11-22 NOTE — Telephone Encounter (Signed)
Request received for Pradaxa 150mg ; pt is 88yrs, Wt-128kg, Crea-0.84 on 07/15/16 via labs from [facility] that are scanned into chart, CrCl-150.26, last seen by Dr. Caryl Comes on 08/25/16.

## 2016-12-13 DIAGNOSIS — Z23 Encounter for immunization: Secondary | ICD-10-CM | POA: Diagnosis not present

## 2016-12-14 ENCOUNTER — Other Ambulatory Visit: Payer: Self-pay | Admitting: Interventional Cardiology

## 2016-12-15 ENCOUNTER — Other Ambulatory Visit: Payer: Self-pay | Admitting: Interventional Cardiology

## 2016-12-15 ENCOUNTER — Ambulatory Visit (INDEPENDENT_AMBULATORY_CARE_PROVIDER_SITE_OTHER): Payer: Medicare Other | Admitting: Internal Medicine

## 2016-12-15 ENCOUNTER — Encounter: Payer: Self-pay | Admitting: Internal Medicine

## 2016-12-15 VITALS — BP 140/74 | HR 67 | Ht 71.0 in | Wt 277.0 lb

## 2016-12-15 DIAGNOSIS — I480 Paroxysmal atrial fibrillation: Secondary | ICD-10-CM | POA: Diagnosis not present

## 2016-12-15 MED ORDER — FLECAINIDE ACETATE 100 MG PO TABS: 100.0000 mg | ORAL_TABLET | Freq: Two times a day (BID) | ORAL | 3 refills | Status: DC

## 2016-12-15 MED ORDER — DABIGATRAN ETEXILATE MESYLATE 150 MG PO CAPS: ORAL_CAPSULE | ORAL | 3 refills | Status: DC

## 2016-12-15 MED ORDER — DILTIAZEM HCL ER COATED BEADS 360 MG PO CP24: ORAL_CAPSULE | ORAL | 3 refills | Status: DC

## 2016-12-15 NOTE — Progress Notes (Signed)
Patient Care Team: Leighton Ruff, MD as PCP - General (Family Medicine)   HPI  Brian Myers is a 71 y.o. male Seen in follow-up for atrial fibrillation dating back about 15 years.  2012 he had seen Dr. Greggory Brandy. Subsequently put on flecainide. Recently has had problems with recurrent atrial fibrillation  is interested in alternatives.  Records and Results Review   DATE TEST    2011    Echo   EF 55-60 % LA 4.7  7/18    Echo   EF 55-60 % LA (42/1.6/23           Date Cr Hgb  9/17 0.98 14.0   He has had a few spells of A. fib in the last month or so   he was at high altitude in the [place]. He is lightheaded short of breath and his phone measured an O2 sat of 75%. He has known history of asthma.   Past Medical History:  Diagnosis Date  . Arthritis    KNEES  . Asthma   . BPH (benign prostatic hypertrophy)    DIFFICULTY GETTING STREAM STARTED  . Dysrhythmia    ATRIAL FIB AND FLUTTER-DR. Linard Millers PT STATES HIS HEART IS USUALLY IN RHYTHM - BUT FOR PAST 10 DAYS OR MORE - HE HAS EXPERIENCED PALPITATIONS AND BREATHLESSNESS - HE HAS CALLED DR. Lemmie Evens. [facility] OFF[facility]   . Eczema    HANDS  . GERD (gastroesophageal reflux disease)   . H/O hiatal hernia   . Hyperlipidemia   . Hyperlipidemia    DOES NOT TOLERATE ANY OF THE STATINS  . Persistent atrial fibrillation (HCC)    Dr Tamala Julian  . Pneumonia    LAST EPISODE JAN 2015  . Right knee meniscal tear    PAIN RT KNEE  . Shortness of breath    WHEN HEART BEAT IRREGULAR  . Sleep apnea    with cpap    Past Surgical History:  Procedure Laterality Date  . CERVICAL FUSION  1991  . KNEE ARTHROSCOPY Right 04/17/2013   Procedure: ARTHROSCOPY RIGHT KNEE WITH MEDIAL AND LATERAL DEBRIDEMENT AND CONDRAPLASTY;  Surgeon: Gearlean Alf, MD;  Location: [facility];  Service: Orthopedics;  Laterality: Right;  . LEFT KNEE ARTHROSCPY  2009    Current Outpatient Prescriptions  Medication Sig Dispense Refill  . albuterol (PROVENTIL  HFA;VENTOLIN HFA) 108 (90 BASE) MCG/ACT inhaler Inhale 2 puffs into the lungs every 6 (six) hours as needed for wheezing or shortness of breath.     . budesonide (PULMICORT) 180 MCG/ACT inhaler Inhale 1 puff into the lungs 2 (two) times daily.     Marland Kitchen diltiazem (CARDIZEM CD) 360 MG 24 hr capsule Take 1 capsule by mouth every evening. Patient is overdue for an appt and needs to call and schedule for further refills 2nd attempt 15 capsule 0  . esomeprazole (NEXIUM) 40 MG capsule Take 40 mg by mouth daily.  1  . flecainide (TAMBOCOR) 100 MG tablet TAKE 1 TABLET BY MOUTH TWICE DAILY 60 tablet 8  . fluticasone (FLONASE) 50 MCG/ACT nasal spray Place 1 spray into both nostrils daily.     . methylcellulose (ARTIFICIAL TEARS) 1 % ophthalmic solution Place 1 drop into both eyes 2 (two) times daily as needed (dry eyes).    Marland Kitchen PRADAXA 150 MG CAPS capsule TAKE ONE CAPSULE BY MOUTH EVERY 12 HOURS 60 capsule 11  . salmeterol (SEREVENT) 50 MCG/DOSE diskus inhaler Inhale 1 puff into the  lungs 2 (two) times daily.      No current facility-administered medications for this visit.     Allergies  Allergen Reactions  . Penicillins Anaphylaxis  . Sulfa Drugs Cross Reactors Anaphylaxis  . Statins     INTOLERANT TO STATINS - SEVERE MUSCLE CRAMPS      Review of Systems negative except from HPI and PMH  Physical Exam BP 140/74   Pulse 67   Ht 5\' 11"  (1.803 m)   Wt 277 lb (125.6 kg)   SpO2 96%   BMI 38.63 kg/m  Well developed and nourished in no acute distress HENT normal Neck supple with JVP-flat Carotids brisk and full without bruits Clear Regular rate and rhythm, no murmurs or gallops Abd-soft with active BS without hepatomegaly No Clubbing cyanosis edema Skin-warm and dry A & Oriented  Grossly normal sensory and motor function   ECG-demonstrates sinus rhythm at 67 Intervals 23/09/40   Assessment and  Plan     Assessment and Plan:  Atrial fibrillation  paroxsymal  Obesity-morbid  LVH  Exercise intolerance With his paroxysmal atrial fibrillation that has become increasingly problematic on flecainide he would like to undertake catheter ablation if he is a candidate. Left atrial size by echo was surprisingly good. His weight, while heavy, I think is probably not preclusive. His sleep apnea is treated.  We will set up an appointment for him to see Dr. Greggory Brandy.  I'm a little bit concerned about [place]. He also says he has chronic O2 sats in the mid 90s. I've asked pulmonary whether evaluation would be prudent. It might be prior to anesthesia.  We spent more than 50% of our >25 min visit in face to face counseling regarding the above

## 2016-12-15 NOTE — Patient Instructions (Addendum)
Medication Instructions:  Your physician recommends that you continue on your current medications as directed. Please refer to the Current Medication list given to you today.  -- If you need a refill on your cardiac medications before your next appointment, please call your pharmacy. --  Labwork: None ordered  Testing/Procedures: None ordered  Follow-Up:  You have been referred to Dr. Rayann Heman to discuss ablation.  Thank you for choosing [facility]!!   [phone]  Any Other Special Instructions Will Be Listed Below (If Applicable).

## 2016-12-22 DIAGNOSIS — L812 Freckles: Secondary | ICD-10-CM | POA: Diagnosis not present

## 2016-12-22 DIAGNOSIS — L820 Inflamed seborrheic keratosis: Secondary | ICD-10-CM | POA: Diagnosis not present

## 2016-12-22 DIAGNOSIS — D1801 Hemangioma of skin and subcutaneous tissue: Secondary | ICD-10-CM | POA: Diagnosis not present

## 2016-12-22 DIAGNOSIS — L738 Other specified follicular disorders: Secondary | ICD-10-CM | POA: Diagnosis not present

## 2016-12-22 DIAGNOSIS — L308 Other specified dermatitis: Secondary | ICD-10-CM | POA: Diagnosis not present

## 2017-01-02 ENCOUNTER — Ambulatory Visit (INDEPENDENT_AMBULATORY_CARE_PROVIDER_SITE_OTHER): Payer: Medicare Other | Admitting: Internal Medicine

## 2017-01-02 VITALS — BP 138/76 | HR 65 | Ht 71.0 in | Wt 276.8 lb

## 2017-01-02 DIAGNOSIS — G4733 Obstructive sleep apnea (adult) (pediatric): Secondary | ICD-10-CM

## 2017-01-02 DIAGNOSIS — I480 Paroxysmal atrial fibrillation: Secondary | ICD-10-CM

## 2017-01-02 DIAGNOSIS — Z9989 Dependence on other enabling machines and devices: Secondary | ICD-10-CM

## 2017-01-02 DIAGNOSIS — I10 Essential (primary) hypertension: Secondary | ICD-10-CM

## 2017-01-02 NOTE — Progress Notes (Signed)
Electrophysiology Office Note   Date:  01/02/2017   ID:  Brian Myers, DOB 12-26-1945, MRN [id number]  PCP:  Brian Ruff, MD  Cardiologist:  Brian Myers Primary Electrophysiologist: Brian Myers  Chief Complaint  Patient presents with  . Atrial Fibrillation     History of Present Illness: Brian Myers is a 71 y.o. male who presents today for electrophysiology evaluation.   The patient has had afib for quite some time (at least 15-20 years).  I have seen him previously in 2012.  He was placed on flecainide and did very well for several years.  Unfortunately, he continues to have atrial fibrillation.  He reports symptoms of palpitations and fatigue about twice per month, typically lasting less than an our. He continues to drink about 4 drinks per day and has made no improvements in lifestyle.  He uses CPAP.  Has occasional asthma.  Today, he denies symptoms of palpitations, chest pain, orthopnea, PND, lower extremity edema, claudication, dizziness, presyncope, syncope, bleeding, or neurologic sequela. The patient is tolerating medications without difficulties and is otherwise without complaint today.    Past Medical History:  Diagnosis Date  . Arthritis    KNEES  . Asthma   . BPH (benign prostatic hypertrophy)    DIFFICULTY GETTING STREAM STARTED  . Dysrhythmia    ATRIAL FIB AND FLUTTER-Brian Myers PT STATES HIS HEART IS USUALLY IN RHYTHM - BUT FOR PAST 10 DAYS OR MORE - HE HAS EXPERIENCED PALPITATIONS AND BREATHLESSNESS - HE HAS CALLED Brian. Lemmie Myers. [facility] OFFICE   . Eczema    HANDS  . GERD (gastroesophageal reflux disease)   . H/O hiatal hernia   . Hyperlipidemia   . Hyperlipidemia    DOES NOT TOLERATE ANY OF THE STATINS  . Persistent atrial fibrillation (HCC)    Brian Myers  . Pneumonia    LAST EPISODE JAN 2015  . Right knee meniscal tear    PAIN RT KNEE  . Shortness of breath    WHEN HEART BEAT IRREGULAR  . Sleep apnea    with cpap   Past Surgical History:    Procedure Laterality Date  . CERVICAL FUSION  1991  . LEFT KNEE ARTHROSCPY  2009     Current Outpatient Medications  Medication Sig Dispense Refill  . albuterol (PROVENTIL HFA;VENTOLIN HFA) 108 (90 BASE) MCG/ACT inhaler Inhale 2 puffs into the lungs every 6 (six) hours as needed for wheezing or shortness of breath.     . budesonide (PULMICORT) 180 MCG/ACT inhaler Inhale 1 puff into the lungs 2 (two) times daily.     . dabigatran (PRADAXA) 150 MG CAPS capsule TAKE ONE CAPSULE BY MOUTH EVERY 12 HOURS 180 capsule 3  . diltiazem (CARDIZEM CD) 360 MG 24 hr capsule Take 1 capsule by mouth every evening. 90 capsule 3  . esomeprazole (NEXIUM) 40 MG capsule Take 40 mg by mouth daily.  1  . flecainide (TAMBOCOR) 100 MG tablet Take 1 tablet (100 mg total) by mouth 2 (two) times daily. 180 tablet 3  . fluticasone (FLONASE) 50 MCG/ACT nasal spray Place 1 spray into both nostrils daily.     . halobetasol (ULTRAVATE) 0.05 % ointment Apply 1 application 2 (two) times daily topically.  3  . methylcellulose (ARTIFICIAL TEARS) 1 % ophthalmic solution Place 1 drop into both eyes 2 (two) times daily as needed (dry eyes).    . salmeterol (SEREVENT) 50 MCG/DOSE diskus inhaler Inhale 1 puff into the lungs 2 (two) times daily.  No current facility-administered medications for this visit.     Allergies:   Penicillins; Sulfa drugs cross reactors; and Statins   Social History:  The patient  reports that  has never smoked. he has never used smokeless tobacco. He reports that he drinks alcohol. He reports that he does not use drugs.   Family History:  The patient's  family history includes Arrhythmia in his mother; Hypertension in his father; Mitral valve prolapse in his mother.    ROS:  Please see the history of present illness.   All other systems are personally reviewed and negative.    PHYSICAL EXAM: VS:  BP 138/76   Pulse 65   Ht 5\' 11"  (1.803 m)   Wt 276 lb 12.8 oz (125.6 kg)   SpO2 94%   BMI  38.61 kg/m  , BMI Body mass index is 38.61 kg/m. GEN: overweight in no acute distress  HEENT: normal  Neck: no JVD, carotid bruits, or masses Cardiac: RRR; no murmurs, rubs, or gallops,no edema  Respiratory:  clear to auscultation bilaterally, normal work of breathing GI: soft, nontender, nondistended, + BS MS: no deformity or atrophy  Skin: warm and dry  Neuro:  Strength and sensation are intact Psych: euthymic mood, full affect  EKG:  EKG is ordered today. The ekg ordered today is personally reviewed and shows sinus rhythm 65 bpm, PR 234 msec, Qtc 422 msec, otherwise normal ekg    Wt Readings from Last 3 Encounters:  01/02/17 276 lb 12.8 oz (125.6 kg)  12/15/16 277 lb (125.6 kg)  08/25/16 282 lb 4 oz (128 kg)     Other studies personally reviewed: Additional studies/ records that were reviewed today include: Brian Myers prior notes , echo 08/30/16 Review of the above records today demonstrates: EF 60%, normal LA size   ASSESSMENT AND PLAN:  1.  Paroxysmal atrial fibrillation/ atrial flutter The patient has symptomatic recurrent atrial fibrillation.  He was also previously told that he had atrial flutter.  He has failed medical therapy with flecainide.  Though his afib was previously persistent, it appears to now be more paroxysmal. Therapeutic strategies for afib including medicine and ablation were discussed in detail with the patient today. Risk, benefits, and alternatives to EP study and radiofrequency ablation for afib were also discussed in detail today.  At this time, he would like to think about this further.  If he decides to proceed, he will contact my office.  He will require cardiac CT prior to ablation. The importance of lifestyle modification was discussed at length today.  2. Morbid obesity  Body mass index is 38.61 kg/m. Lifestyle modification is encouraged  3. OSA Compliance with CPAP encouraged  4. ETOh Avoidance is encouraged   Follow-up with Drs Brian Myers  and Brian Myers as scheduled  Current medicines are reviewed at length with the patient today.   The patient does not have concerns regarding his medicines.  The following changes were made today:  none  Labs/ tests ordered today include:  Orders Placed This Encounter  Procedures  . EKG 12-Lead     Signed, Thompson Grayer, MD  01/02/2017 3:18 PM     [address] [place] [address] [phone] (office) [phone] (fax)

## 2017-01-02 NOTE — Patient Instructions (Signed)
Medication Instructions:  Your physician recommends that you continue on your current medications as directed. Please refer to the Current Medication list given to you today.  -- If you need a refill on your cardiac medications before your next appointment, please call your pharmacy. --  Labwork: None ordered  Testing/Procedures: None ordered  Follow-Up: Your physician wants you to follow-up as needed with Dr. Allred.     You will receive a reminder letter in the mail two months in advance. If you don't receive a letter, please call our office to schedule the follow-up appointment.  Thank you for choosing [facility]!!   Rachard Abron Neddo, RN [phone]  Any Other Special Instructions Will Be Listed Below (If Applicable).         

## 2017-01-05 NOTE — Addendum Note (Signed)
Addended by: De Burrs on: 01/05/2017 03:52 PM   Modules accepted: Orders

## 2017-01-25 ENCOUNTER — Ambulatory Visit (INDEPENDENT_AMBULATORY_CARE_PROVIDER_SITE_OTHER): Payer: Medicare Other | Admitting: Neurology

## 2017-01-25 ENCOUNTER — Encounter: Payer: Self-pay | Admitting: Neurology

## 2017-01-25 VITALS — BP 142/77 | HR 66 | Ht 71.0 in | Wt 280.0 lb

## 2017-01-25 DIAGNOSIS — Z9989 Dependence on other enabling machines and devices: Secondary | ICD-10-CM

## 2017-01-25 DIAGNOSIS — G4733 Obstructive sleep apnea (adult) (pediatric): Secondary | ICD-10-CM

## 2017-01-25 DIAGNOSIS — E669 Obesity, unspecified: Secondary | ICD-10-CM | POA: Diagnosis not present

## 2017-01-25 NOTE — Patient Instructions (Signed)

## 2017-01-25 NOTE — Progress Notes (Signed)
SLEEP MEDICINE CLINIC   Provider:  Larey Seat, M D  Referring Provider: Leighton Ruff, MD Primary Care Physician:  Leighton Ruff, MD  Chief Complaint  Patient presents with  . Follow-up    pt alone, rm 11. pt states CPAP is working well    HPI:  Brian Myers is a 71 y.o. male , seen here seen in RV from Dr. Drema Dallas for for CPAP care.    I have the pleasure of seeing Brian Myers today on 25 January 2017, he has been a long established CPAP patient in our practice with 100% compliance by days and time of CPAP use again.  Data from 26 November reveal 8 hours and 2 minutes on average use of time, the patient uses an AutoSet between 8 and 15 cm water pressure with 3 cm expiratory pressure relief.  Maximum air leak is only 7.6 L/min, the 95th percentile pressure is 14.9 cm water, and his residual AHI is 3.2.  He does not show treatment emergent central apneas.  We could leave all settings as are currently.   I have noticed a facial rash which is followed by dermatology, it does not interfere with his CPAP interface.   Chief complaint according to patient : Originally I met Brian Myers on the 5th of February  2008. He was referred directly by Dr. Sheryn Bison for a sleep study , which was then followed by a CPAP titration. His diagnostic polysomnogram documented an AHI of 50. He was titrated to CPAP at 10 cm water setting, he did have some periodic limb movements. He has used CPAP ever since. After his titration at [facility] he has been followed in the office in 2008 2010 in 2011. His BMI exceeded 35 in his last visit he had also a slightly elevated blood pressure but he did not feel excessively daytime sleepy any longer. At the time he was still employed at [employer]. He was diagnosed with atrial fibrillation and treated for stroke prevention was Pradaxa he continued to feel some palpitations, some gallops and was placed on diltiazem 180 mg twice a day for rate  control. He used his CPAP continuously. It was possible today to obtain a download from his machine and he is indeed 100% compliant over the last 30 days with an average user time of 8 hours and 90 minutes. This CPAP is currently set at 12 cm water with 3 cm EPR. His residual AHI is 2.9 which is excellent. Brian Myers still receives supplies for his CPAP through our [facility]. However he is due for a new machine his current machine is over 19 years old.  Sleep habits are as follows: Brian Myers is now retired. He goes to bed around 11 PM and rises between 7 and 7:30 AM. He feels that he gets sufficient sleep. While using CPAP he has not been snoring and has not suffered from any further apnea. He continues to be a restless sleeper according to his wife's reports. He endorsed the Epworth sleepiness score at 5 points fatigue severity score at 23 points. He sleeps on his side and one pillow, he has 1 or 2 bathroom breaks. He wakes without headaches or pain.   09-28-2015, Brian Myers is seen year today for a revisit, following a split-night titration polysomnography dated 08/28/2015. The study documented rather severe insomnia with 360 minutes of wake time after sleep onset. REM sleep latency was 434 minutes. He is not used to early night time,  he felt the room was hot and the humidity not high enough/   In the brief recorded sleep. Apnea was verified with an AHI of 22 per hour and the patient presented with continued hypoxemia by CPAP was increased to 14 cm water. The AHI at the setting was 2.1 per hour it never reached 0. In addition the patient had significant periodic limb movements and twitches at night which led to admission a high arousal index of 15 per hour. I advised to start an oxygen titration was 1-2 L  titrated to BiPAP.  The patient reports that at home he sleeps much better which would lead me to believe that we should do an outer titration in the comfort of his home for 30 days. He would much  likely a get reliable results this way. He advised me that he restarted asthma medications. I like for the patient to use oxygen over night.  His leg movements have increased, lower back problems, but helped massage and icing. He feels he needs no additional medication.  Sleep medical history:  diagnosed with OSA at AHI 50 in 2008.  Social history: married, non-smoker , non drinker, caffeine ; 1 cup of tea in AM.- married for over 40 years - 2 adult children. 5 grandchildren ( son in [place], Daughter in [place])   Interval history from 01/26/2016, Brian Myers is doing very well with an outer titrated but he requested the bottom pressure to be lifted from 7-8 cm water pressure and we will lift the maximum pressure from 12-15 cm water. His 95th percentile pressure need is exactly at the windows maximal. His AHI is 5.1, apnea index is 2.0 and all of these are obstructive in nature. These residual apneas should respond to an elevation in pressure. He has been using the machine 100% of all days 7 hours and 54 minutes on average. I will see Brian Myers in a year after the adjustment of his auto- titration, his Epworth sleepiness score is endorsed at 4 and his fatigue severity score was not endorsed, his geriatric depression score is endorsed at 1 out of 15 points. His sleep disorder is well controlled.     Review of Systems: Out of a complete 14 system review, the patient complains of only the following symptoms, and all other reviewed systems are negative. Epworth score was endorsed at 5 points fatigue severity score at 23 points, a depression score 1 out of 15 points was endorsed.  No clinical evidence of depression. Brian Myers reports mild breakthrough snoring at times.  Social History   Socioeconomic History  . Marital status: Married    Spouse name: Not on file  . Number of children: 2  . Years of education: Not on file  . Highest education level: Not on file  Social Needs  . Financial resource  strain: Not on file  . Food insecurity - worry: Not on file  . Food insecurity - inability: Not on file  . Transportation needs - medical: Not on file  . Transportation needs - non-medical: Not on file  Occupational History    Employer: [employer]  Tobacco Use  . Smoking status: Never Smoker  . Smokeless tobacco: Never Used  Substance and Sexual Activity  . Alcohol use: Yes    Alcohol/week: 0.0 oz    Comment: at least 2-3 glasses of wine per day  . Drug use: No  . Sexual activity: Not on file  Other Topics Concern  . Not on  file  Social History Narrative   Lives in [place] with spouse.  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    Family History  Problem Relation Age of Onset  . Arrhythmia Mother        atrial fib  . Mitral valve prolapse Mother   . Hypertension Father     Past Medical History:  Diagnosis Date  . Arthritis    KNEES  . Asthma   . BPH (benign prostatic hypertrophy)    DIFFICULTY GETTING STREAM STARTED  . Dysrhythmia    ATRIAL FIB AND FLUTTER-DR. Linard Millers PT STATES HIS HEART IS USUALLY IN RHYTHM - BUT FOR PAST 10 DAYS OR MORE - HE HAS EXPERIENCED PALPITATIONS AND BREATHLESSNESS - HE HAS CALLED DR. Lemmie Evens. [facility] OFFICE   . Eczema    HANDS  . GERD (gastroesophageal reflux disease)   . H/O hiatal hernia   . Hyperlipidemia   . Hyperlipidemia    DOES NOT TOLERATE ANY OF THE STATINS  . Persistent atrial fibrillation (HCC)    Dr Tamala Julian  . Pneumonia    LAST EPISODE JAN 2015  . Right knee meniscal tear    PAIN RT KNEE  . Shortness of breath    WHEN HEART BEAT IRREGULAR  . Sleep apnea    with cpap    Past Surgical History:  Procedure Laterality Date  . CERVICAL FUSION  1991  . KNEE ARTHROSCOPY Right 04/17/2013   Procedure: ARTHROSCOPY RIGHT KNEE WITH MEDIAL AND LATERAL DEBRIDEMENT AND CONDRAPLASTY;  Surgeon: Gearlean Alf, MD;  Location: [facility];  Service: Orthopedics;  Laterality: Right;  . LEFT KNEE ARTHROSCPY  2009    Current Outpatient  Medications  Medication Sig Dispense Refill  . albuterol (PROVENTIL HFA;VENTOLIN HFA) 108 (90 BASE) MCG/ACT inhaler Inhale 2 puffs into the lungs every 6 (six) hours as needed for wheezing or shortness of breath.     . betamethasone dipropionate (DIPROLENE) 0.05 % ointment     . budesonide (PULMICORT) 180 MCG/ACT inhaler Inhale 1 puff into the lungs 2 (two) times daily.     . dabigatran (PRADAXA) 150 MG CAPS capsule TAKE ONE CAPSULE BY MOUTH EVERY 12 HOURS 180 capsule 3  . diltiazem (CARDIZEM CD) 360 MG 24 hr capsule Take 1 capsule by mouth every evening. 90 capsule 3  . esomeprazole (NEXIUM) 40 MG capsule Take 40 mg by mouth daily.  1  . flecainide (TAMBOCOR) 100 MG tablet Take 1 tablet (100 mg total) by mouth 2 (two) times daily. 180 tablet 3  . fluticasone (FLONASE) 50 MCG/ACT nasal spray Place 1 spray into both nostrils daily.     . halobetasol (ULTRAVATE) 0.05 % ointment Apply 1 application 2 (two) times daily topically.  3  . methylcellulose (ARTIFICIAL TEARS) 1 % ophthalmic solution Place 1 drop into both eyes 2 (two) times daily as needed (dry eyes).    . salmeterol (SEREVENT) 50 MCG/DOSE diskus inhaler Inhale 1 puff into the lungs 2 (two) times daily.      No current facility-administered medications for this visit.     Allergies as of 01/25/2017 - Review Complete 01/25/2017  Allergen Reaction Noted  . Penicillins Anaphylaxis   . Sulfa drugs cross reactors Anaphylaxis   . Statins  04/12/2013    Vitals: BP (!) 142/77   Pulse 66   Ht 5' 11"  (1.803 m)   Wt 280 lb (127 kg)   BMI 39.05 kg/m  Last Weight:  Wt Readings from Last 1 Encounters:  01/25/17 280  lb (127 kg)   TWS:FKCL mass index is 39.05 kg/m.     Last Height:   Ht Readings from Last 1 Encounters:  01/25/17 5' 11"  (1.803 m)    Physical exam:  General: The patient is awake, alert and appears not in acute distress. The patient is well groomed. Head: Normocephalic, atraumatic. Neck is supple. Mallampati  2 neck  circumference: 19 . Nasal airflow patent  . Prognathia is seen.  Patient with a fib, currently in NSR.   Facial rash - rosacea? Easy bruising.  Neurologic exam : The patient is awake and alert, oriented to place and time.   Memory subjective  described as intact.  Attention span & concentration ability appears normal. Speech is fluent, without dysarthria, dysphonia or aphasia.   Cranial nerves: Pupils are equal and briskly reactive to light.  Facial sensation intact to fine touch. Facial motor strength is symmetric and tongue and uvula move midline. Shoulder shrug was symmetrical.   Deep tendon reflexes: in the upper and lower extremities are symmetric and intact.  The patient was advised of the nature of the diagnosed sleep disorder , the treatment options and risks for general a health and wellness arising from not treating the condition.  I spent more than 20 minutes of face to face time with the patient. Greater than 50% of time was spent in counseling and coordination of care. We have discussed the diagnosis and differential and I answered the patient's questions.   He is doing extremely well with CPAP, his residual AHI could be further reduced by increasing the upper pressure limit of his auto titration.    Assessment:  After physical and neurologic examination, review of laboratory studies,  Personal review of imaging studies, reports of other /same  Imaging studies ,  Results of polysomnography/ neurophysiology testing and pre-existing records as far as provided in visit., my assessment is   1) Brian Myers has again been a 100% compliant-  CPAP user of  AutoSet between 7 and 15 cm water pressure with 3 cm EPR, 100% compliance, 95% pressure was 14.9 cm.  FFM  Co-morbidities are obesity, asthma and PLMs, and atrial fibrillation on chronic anticoagulation.   Plan:  Treatment plan and additional workup :  I like for Brian Myers to continue auto-titrator, between 8 cm water through 15 cm  with 3 cm flex or airsense.   RV in 12 month -  With NP CM next time.      Brian Partridge Voncille Simm MD  01/25/2017   CC: Leighton Ruff, [address], [place] [address]

## 2017-03-06 ENCOUNTER — Institutional Professional Consult (permissible substitution): Payer: Medicare Other | Admitting: Internal Medicine

## 2017-03-20 DIAGNOSIS — H2513 Age-related nuclear cataract, bilateral: Secondary | ICD-10-CM | POA: Diagnosis not present

## 2017-03-28 DIAGNOSIS — R7303 Prediabetes: Secondary | ICD-10-CM | POA: Diagnosis not present

## 2017-03-28 DIAGNOSIS — M545 Low back pain: Secondary | ICD-10-CM | POA: Diagnosis not present

## 2017-03-28 DIAGNOSIS — M79606 Pain in leg, unspecified: Secondary | ICD-10-CM | POA: Diagnosis not present

## 2017-03-28 DIAGNOSIS — E7800 Pure hypercholesterolemia, unspecified: Secondary | ICD-10-CM | POA: Diagnosis not present

## 2017-04-05 DIAGNOSIS — M545 Low back pain: Secondary | ICD-10-CM | POA: Diagnosis not present

## 2017-04-05 DIAGNOSIS — M5416 Radiculopathy, lumbar region: Secondary | ICD-10-CM | POA: Diagnosis not present

## 2017-04-05 DIAGNOSIS — M62830 Muscle spasm of back: Secondary | ICD-10-CM | POA: Diagnosis not present

## 2017-04-05 DIAGNOSIS — M799 Soft tissue disorder, unspecified: Secondary | ICD-10-CM | POA: Diagnosis not present

## 2017-04-07 DIAGNOSIS — M799 Soft tissue disorder, unspecified: Secondary | ICD-10-CM | POA: Diagnosis not present

## 2017-04-07 DIAGNOSIS — M62830 Muscle spasm of back: Secondary | ICD-10-CM | POA: Diagnosis not present

## 2017-04-07 DIAGNOSIS — M5416 Radiculopathy, lumbar region: Secondary | ICD-10-CM | POA: Diagnosis not present

## 2017-04-07 DIAGNOSIS — M545 Low back pain: Secondary | ICD-10-CM | POA: Diagnosis not present

## 2017-04-11 DIAGNOSIS — M545 Low back pain: Secondary | ICD-10-CM | POA: Diagnosis not present

## 2017-04-11 DIAGNOSIS — M799 Soft tissue disorder, unspecified: Secondary | ICD-10-CM | POA: Diagnosis not present

## 2017-04-11 DIAGNOSIS — M5416 Radiculopathy, lumbar region: Secondary | ICD-10-CM | POA: Diagnosis not present

## 2017-04-11 DIAGNOSIS — M62830 Muscle spasm of back: Secondary | ICD-10-CM | POA: Diagnosis not present

## 2017-04-12 DIAGNOSIS — M545 Low back pain: Secondary | ICD-10-CM | POA: Diagnosis not present

## 2017-04-12 DIAGNOSIS — M799 Soft tissue disorder, unspecified: Secondary | ICD-10-CM | POA: Diagnosis not present

## 2017-04-12 DIAGNOSIS — M5416 Radiculopathy, lumbar region: Secondary | ICD-10-CM | POA: Diagnosis not present

## 2017-04-12 DIAGNOSIS — M62830 Muscle spasm of back: Secondary | ICD-10-CM | POA: Diagnosis not present

## 2017-04-13 DIAGNOSIS — M5416 Radiculopathy, lumbar region: Secondary | ICD-10-CM | POA: Diagnosis not present

## 2017-04-13 DIAGNOSIS — M799 Soft tissue disorder, unspecified: Secondary | ICD-10-CM | POA: Diagnosis not present

## 2017-04-13 DIAGNOSIS — H40003 Preglaucoma, unspecified, bilateral: Secondary | ICD-10-CM | POA: Diagnosis not present

## 2017-04-13 DIAGNOSIS — M545 Low back pain: Secondary | ICD-10-CM | POA: Diagnosis not present

## 2017-04-13 DIAGNOSIS — M62830 Muscle spasm of back: Secondary | ICD-10-CM | POA: Diagnosis not present

## 2017-04-17 DIAGNOSIS — M545 Low back pain: Secondary | ICD-10-CM | POA: Diagnosis not present

## 2017-04-17 DIAGNOSIS — M799 Soft tissue disorder, unspecified: Secondary | ICD-10-CM | POA: Diagnosis not present

## 2017-04-17 DIAGNOSIS — M5416 Radiculopathy, lumbar region: Secondary | ICD-10-CM | POA: Diagnosis not present

## 2017-04-17 DIAGNOSIS — M62830 Muscle spasm of back: Secondary | ICD-10-CM | POA: Diagnosis not present

## 2017-04-18 DIAGNOSIS — M545 Low back pain: Secondary | ICD-10-CM | POA: Diagnosis not present

## 2017-04-18 DIAGNOSIS — M799 Soft tissue disorder, unspecified: Secondary | ICD-10-CM | POA: Diagnosis not present

## 2017-04-18 DIAGNOSIS — M5416 Radiculopathy, lumbar region: Secondary | ICD-10-CM | POA: Diagnosis not present

## 2017-04-18 DIAGNOSIS — M62830 Muscle spasm of back: Secondary | ICD-10-CM | POA: Diagnosis not present

## 2017-04-26 DIAGNOSIS — M62830 Muscle spasm of back: Secondary | ICD-10-CM | POA: Diagnosis not present

## 2017-04-26 DIAGNOSIS — M545 Low back pain: Secondary | ICD-10-CM | POA: Diagnosis not present

## 2017-04-26 DIAGNOSIS — M5416 Radiculopathy, lumbar region: Secondary | ICD-10-CM | POA: Diagnosis not present

## 2017-04-26 DIAGNOSIS — M799 Soft tissue disorder, unspecified: Secondary | ICD-10-CM | POA: Diagnosis not present

## 2017-04-27 DIAGNOSIS — M5416 Radiculopathy, lumbar region: Secondary | ICD-10-CM | POA: Diagnosis not present

## 2017-04-27 DIAGNOSIS — M799 Soft tissue disorder, unspecified: Secondary | ICD-10-CM | POA: Diagnosis not present

## 2017-04-27 DIAGNOSIS — M62830 Muscle spasm of back: Secondary | ICD-10-CM | POA: Diagnosis not present

## 2017-04-27 DIAGNOSIS — M545 Low back pain: Secondary | ICD-10-CM | POA: Diagnosis not present

## 2017-04-28 DIAGNOSIS — M5416 Radiculopathy, lumbar region: Secondary | ICD-10-CM | POA: Diagnosis not present

## 2017-04-28 DIAGNOSIS — M545 Low back pain: Secondary | ICD-10-CM | POA: Diagnosis not present

## 2017-04-28 DIAGNOSIS — M799 Soft tissue disorder, unspecified: Secondary | ICD-10-CM | POA: Diagnosis not present

## 2017-04-28 DIAGNOSIS — M62830 Muscle spasm of back: Secondary | ICD-10-CM | POA: Diagnosis not present

## 2017-05-02 DIAGNOSIS — M799 Soft tissue disorder, unspecified: Secondary | ICD-10-CM | POA: Diagnosis not present

## 2017-05-02 DIAGNOSIS — M545 Low back pain: Secondary | ICD-10-CM | POA: Diagnosis not present

## 2017-05-02 DIAGNOSIS — M62830 Muscle spasm of back: Secondary | ICD-10-CM | POA: Diagnosis not present

## 2017-05-02 DIAGNOSIS — M5416 Radiculopathy, lumbar region: Secondary | ICD-10-CM | POA: Diagnosis not present

## 2017-05-03 DIAGNOSIS — M62830 Muscle spasm of back: Secondary | ICD-10-CM | POA: Diagnosis not present

## 2017-05-03 DIAGNOSIS — M545 Low back pain: Secondary | ICD-10-CM | POA: Diagnosis not present

## 2017-05-03 DIAGNOSIS — M5416 Radiculopathy, lumbar region: Secondary | ICD-10-CM | POA: Diagnosis not present

## 2017-05-03 DIAGNOSIS — M799 Soft tissue disorder, unspecified: Secondary | ICD-10-CM | POA: Diagnosis not present

## 2017-05-04 DIAGNOSIS — M545 Low back pain: Secondary | ICD-10-CM | POA: Diagnosis not present

## 2017-05-04 DIAGNOSIS — M799 Soft tissue disorder, unspecified: Secondary | ICD-10-CM | POA: Diagnosis not present

## 2017-05-04 DIAGNOSIS — M5416 Radiculopathy, lumbar region: Secondary | ICD-10-CM | POA: Diagnosis not present

## 2017-05-04 DIAGNOSIS — M62830 Muscle spasm of back: Secondary | ICD-10-CM | POA: Diagnosis not present

## 2017-05-08 DIAGNOSIS — M5416 Radiculopathy, lumbar region: Secondary | ICD-10-CM | POA: Diagnosis not present

## 2017-05-08 DIAGNOSIS — M62830 Muscle spasm of back: Secondary | ICD-10-CM | POA: Diagnosis not present

## 2017-05-08 DIAGNOSIS — M545 Low back pain: Secondary | ICD-10-CM | POA: Diagnosis not present

## 2017-05-08 DIAGNOSIS — M799 Soft tissue disorder, unspecified: Secondary | ICD-10-CM | POA: Diagnosis not present

## 2017-05-10 DIAGNOSIS — M545 Low back pain: Secondary | ICD-10-CM | POA: Diagnosis not present

## 2017-05-10 DIAGNOSIS — M62830 Muscle spasm of back: Secondary | ICD-10-CM | POA: Diagnosis not present

## 2017-05-10 DIAGNOSIS — M5416 Radiculopathy, lumbar region: Secondary | ICD-10-CM | POA: Diagnosis not present

## 2017-05-10 DIAGNOSIS — M799 Soft tissue disorder, unspecified: Secondary | ICD-10-CM | POA: Diagnosis not present

## 2017-05-11 DIAGNOSIS — J45909 Unspecified asthma, uncomplicated: Secondary | ICD-10-CM | POA: Diagnosis not present

## 2017-05-11 DIAGNOSIS — R7301 Impaired fasting glucose: Secondary | ICD-10-CM | POA: Diagnosis not present

## 2017-05-11 DIAGNOSIS — G4730 Sleep apnea, unspecified: Secondary | ICD-10-CM | POA: Diagnosis not present

## 2017-05-11 DIAGNOSIS — I10 Essential (primary) hypertension: Secondary | ICD-10-CM | POA: Diagnosis not present

## 2017-05-11 DIAGNOSIS — E7800 Pure hypercholesterolemia, unspecified: Secondary | ICD-10-CM | POA: Diagnosis not present

## 2017-05-11 DIAGNOSIS — I4891 Unspecified atrial fibrillation: Secondary | ICD-10-CM | POA: Diagnosis not present

## 2017-05-11 DIAGNOSIS — E785 Hyperlipidemia, unspecified: Secondary | ICD-10-CM | POA: Diagnosis not present

## 2017-05-11 DIAGNOSIS — G720 Drug-induced myopathy: Secondary | ICD-10-CM | POA: Diagnosis not present

## 2017-05-11 DIAGNOSIS — Z789 Other specified health status: Secondary | ICD-10-CM | POA: Diagnosis not present

## 2017-05-11 DIAGNOSIS — Z862 Personal history of diseases of the blood and blood-forming organs and certain disorders involving the immune mechanism: Secondary | ICD-10-CM | POA: Diagnosis not present

## 2017-05-12 DIAGNOSIS — M799 Soft tissue disorder, unspecified: Secondary | ICD-10-CM | POA: Diagnosis not present

## 2017-05-12 DIAGNOSIS — M545 Low back pain: Secondary | ICD-10-CM | POA: Diagnosis not present

## 2017-05-12 DIAGNOSIS — M62830 Muscle spasm of back: Secondary | ICD-10-CM | POA: Diagnosis not present

## 2017-05-12 DIAGNOSIS — M5416 Radiculopathy, lumbar region: Secondary | ICD-10-CM | POA: Diagnosis not present

## 2017-05-17 DIAGNOSIS — M799 Soft tissue disorder, unspecified: Secondary | ICD-10-CM | POA: Diagnosis not present

## 2017-05-17 DIAGNOSIS — M5416 Radiculopathy, lumbar region: Secondary | ICD-10-CM | POA: Diagnosis not present

## 2017-05-17 DIAGNOSIS — M62830 Muscle spasm of back: Secondary | ICD-10-CM | POA: Diagnosis not present

## 2017-05-17 DIAGNOSIS — M545 Low back pain: Secondary | ICD-10-CM | POA: Diagnosis not present

## 2017-05-18 DIAGNOSIS — M799 Soft tissue disorder, unspecified: Secondary | ICD-10-CM | POA: Diagnosis not present

## 2017-05-18 DIAGNOSIS — M545 Low back pain: Secondary | ICD-10-CM | POA: Diagnosis not present

## 2017-05-18 DIAGNOSIS — M5416 Radiculopathy, lumbar region: Secondary | ICD-10-CM | POA: Diagnosis not present

## 2017-05-18 DIAGNOSIS — M62830 Muscle spasm of back: Secondary | ICD-10-CM | POA: Diagnosis not present

## 2017-05-19 DIAGNOSIS — M62830 Muscle spasm of back: Secondary | ICD-10-CM | POA: Diagnosis not present

## 2017-05-19 DIAGNOSIS — M5416 Radiculopathy, lumbar region: Secondary | ICD-10-CM | POA: Diagnosis not present

## 2017-05-19 DIAGNOSIS — M799 Soft tissue disorder, unspecified: Secondary | ICD-10-CM | POA: Diagnosis not present

## 2017-05-19 DIAGNOSIS — M545 Low back pain: Secondary | ICD-10-CM | POA: Diagnosis not present

## 2017-05-22 DIAGNOSIS — M5416 Radiculopathy, lumbar region: Secondary | ICD-10-CM | POA: Diagnosis not present

## 2017-05-22 DIAGNOSIS — M545 Low back pain: Secondary | ICD-10-CM | POA: Diagnosis not present

## 2017-05-22 DIAGNOSIS — M799 Soft tissue disorder, unspecified: Secondary | ICD-10-CM | POA: Diagnosis not present

## 2017-05-22 DIAGNOSIS — M62830 Muscle spasm of back: Secondary | ICD-10-CM | POA: Diagnosis not present

## 2017-05-23 DIAGNOSIS — M799 Soft tissue disorder, unspecified: Secondary | ICD-10-CM | POA: Diagnosis not present

## 2017-05-23 DIAGNOSIS — M5416 Radiculopathy, lumbar region: Secondary | ICD-10-CM | POA: Diagnosis not present

## 2017-05-23 DIAGNOSIS — M62830 Muscle spasm of back: Secondary | ICD-10-CM | POA: Diagnosis not present

## 2017-05-23 DIAGNOSIS — M545 Low back pain: Secondary | ICD-10-CM | POA: Diagnosis not present

## 2017-05-25 DIAGNOSIS — M5416 Radiculopathy, lumbar region: Secondary | ICD-10-CM | POA: Diagnosis not present

## 2017-05-25 DIAGNOSIS — M545 Low back pain: Secondary | ICD-10-CM | POA: Diagnosis not present

## 2017-05-25 DIAGNOSIS — M799 Soft tissue disorder, unspecified: Secondary | ICD-10-CM | POA: Diagnosis not present

## 2017-05-25 DIAGNOSIS — M62830 Muscle spasm of back: Secondary | ICD-10-CM | POA: Diagnosis not present

## 2017-05-29 DIAGNOSIS — M62830 Muscle spasm of back: Secondary | ICD-10-CM | POA: Diagnosis not present

## 2017-05-29 DIAGNOSIS — M5416 Radiculopathy, lumbar region: Secondary | ICD-10-CM | POA: Diagnosis not present

## 2017-05-29 DIAGNOSIS — M545 Low back pain: Secondary | ICD-10-CM | POA: Diagnosis not present

## 2017-05-29 DIAGNOSIS — M799 Soft tissue disorder, unspecified: Secondary | ICD-10-CM | POA: Diagnosis not present

## 2017-06-02 DIAGNOSIS — M545 Low back pain: Secondary | ICD-10-CM | POA: Diagnosis not present

## 2017-06-02 DIAGNOSIS — M62830 Muscle spasm of back: Secondary | ICD-10-CM | POA: Diagnosis not present

## 2017-06-02 DIAGNOSIS — M799 Soft tissue disorder, unspecified: Secondary | ICD-10-CM | POA: Diagnosis not present

## 2017-06-02 DIAGNOSIS — M5416 Radiculopathy, lumbar region: Secondary | ICD-10-CM | POA: Diagnosis not present

## 2017-06-07 DIAGNOSIS — M5416 Radiculopathy, lumbar region: Secondary | ICD-10-CM | POA: Diagnosis not present

## 2017-06-07 DIAGNOSIS — M545 Low back pain: Secondary | ICD-10-CM | POA: Diagnosis not present

## 2017-06-07 DIAGNOSIS — M799 Soft tissue disorder, unspecified: Secondary | ICD-10-CM | POA: Diagnosis not present

## 2017-06-07 DIAGNOSIS — M62830 Muscle spasm of back: Secondary | ICD-10-CM | POA: Diagnosis not present

## 2017-06-09 DIAGNOSIS — M545 Low back pain: Secondary | ICD-10-CM | POA: Diagnosis not present

## 2017-06-09 DIAGNOSIS — M62830 Muscle spasm of back: Secondary | ICD-10-CM | POA: Diagnosis not present

## 2017-06-09 DIAGNOSIS — M799 Soft tissue disorder, unspecified: Secondary | ICD-10-CM | POA: Diagnosis not present

## 2017-06-09 DIAGNOSIS — M5416 Radiculopathy, lumbar region: Secondary | ICD-10-CM | POA: Diagnosis not present

## 2017-06-13 DIAGNOSIS — M799 Soft tissue disorder, unspecified: Secondary | ICD-10-CM | POA: Diagnosis not present

## 2017-06-13 DIAGNOSIS — M5416 Radiculopathy, lumbar region: Secondary | ICD-10-CM | POA: Diagnosis not present

## 2017-06-13 DIAGNOSIS — M545 Low back pain: Secondary | ICD-10-CM | POA: Diagnosis not present

## 2017-06-13 DIAGNOSIS — M62830 Muscle spasm of back: Secondary | ICD-10-CM | POA: Diagnosis not present

## 2017-06-14 DIAGNOSIS — M62830 Muscle spasm of back: Secondary | ICD-10-CM | POA: Diagnosis not present

## 2017-06-14 DIAGNOSIS — M545 Low back pain: Secondary | ICD-10-CM | POA: Diagnosis not present

## 2017-06-14 DIAGNOSIS — M5416 Radiculopathy, lumbar region: Secondary | ICD-10-CM | POA: Diagnosis not present

## 2017-06-14 DIAGNOSIS — M799 Soft tissue disorder, unspecified: Secondary | ICD-10-CM | POA: Diagnosis not present

## 2017-06-16 DIAGNOSIS — M62830 Muscle spasm of back: Secondary | ICD-10-CM | POA: Diagnosis not present

## 2017-06-16 DIAGNOSIS — M545 Low back pain: Secondary | ICD-10-CM | POA: Diagnosis not present

## 2017-06-16 DIAGNOSIS — M799 Soft tissue disorder, unspecified: Secondary | ICD-10-CM | POA: Diagnosis not present

## 2017-06-16 DIAGNOSIS — M5416 Radiculopathy, lumbar region: Secondary | ICD-10-CM | POA: Diagnosis not present

## 2017-06-21 DIAGNOSIS — M545 Low back pain: Secondary | ICD-10-CM | POA: Diagnosis not present

## 2017-06-21 DIAGNOSIS — M5416 Radiculopathy, lumbar region: Secondary | ICD-10-CM | POA: Diagnosis not present

## 2017-06-21 DIAGNOSIS — M799 Soft tissue disorder, unspecified: Secondary | ICD-10-CM | POA: Diagnosis not present

## 2017-06-21 DIAGNOSIS — M62830 Muscle spasm of back: Secondary | ICD-10-CM | POA: Diagnosis not present

## 2017-06-23 DIAGNOSIS — M62830 Muscle spasm of back: Secondary | ICD-10-CM | POA: Diagnosis not present

## 2017-06-23 DIAGNOSIS — M545 Low back pain: Secondary | ICD-10-CM | POA: Diagnosis not present

## 2017-06-23 DIAGNOSIS — M799 Soft tissue disorder, unspecified: Secondary | ICD-10-CM | POA: Diagnosis not present

## 2017-06-23 DIAGNOSIS — M5416 Radiculopathy, lumbar region: Secondary | ICD-10-CM | POA: Diagnosis not present

## 2017-06-27 DIAGNOSIS — M799 Soft tissue disorder, unspecified: Secondary | ICD-10-CM | POA: Diagnosis not present

## 2017-06-27 DIAGNOSIS — M5416 Radiculopathy, lumbar region: Secondary | ICD-10-CM | POA: Diagnosis not present

## 2017-06-27 DIAGNOSIS — M62830 Muscle spasm of back: Secondary | ICD-10-CM | POA: Diagnosis not present

## 2017-06-27 DIAGNOSIS — M545 Low back pain: Secondary | ICD-10-CM | POA: Diagnosis not present

## 2017-06-29 DIAGNOSIS — M5416 Radiculopathy, lumbar region: Secondary | ICD-10-CM | POA: Diagnosis not present

## 2017-06-29 DIAGNOSIS — M62830 Muscle spasm of back: Secondary | ICD-10-CM | POA: Diagnosis not present

## 2017-06-29 DIAGNOSIS — M799 Soft tissue disorder, unspecified: Secondary | ICD-10-CM | POA: Diagnosis not present

## 2017-06-29 DIAGNOSIS — M545 Low back pain: Secondary | ICD-10-CM | POA: Diagnosis not present

## 2017-07-03 DIAGNOSIS — M545 Low back pain: Secondary | ICD-10-CM | POA: Diagnosis not present

## 2017-07-03 DIAGNOSIS — M799 Soft tissue disorder, unspecified: Secondary | ICD-10-CM | POA: Diagnosis not present

## 2017-07-03 DIAGNOSIS — M5416 Radiculopathy, lumbar region: Secondary | ICD-10-CM | POA: Diagnosis not present

## 2017-07-03 DIAGNOSIS — M62830 Muscle spasm of back: Secondary | ICD-10-CM | POA: Diagnosis not present

## 2017-07-06 DIAGNOSIS — M62830 Muscle spasm of back: Secondary | ICD-10-CM | POA: Diagnosis not present

## 2017-07-06 DIAGNOSIS — M545 Low back pain: Secondary | ICD-10-CM | POA: Diagnosis not present

## 2017-07-06 DIAGNOSIS — M799 Soft tissue disorder, unspecified: Secondary | ICD-10-CM | POA: Diagnosis not present

## 2017-07-06 DIAGNOSIS — M5416 Radiculopathy, lumbar region: Secondary | ICD-10-CM | POA: Diagnosis not present

## 2017-07-10 DIAGNOSIS — M5416 Radiculopathy, lumbar region: Secondary | ICD-10-CM | POA: Diagnosis not present

## 2017-07-10 DIAGNOSIS — M62830 Muscle spasm of back: Secondary | ICD-10-CM | POA: Diagnosis not present

## 2017-07-10 DIAGNOSIS — M545 Low back pain: Secondary | ICD-10-CM | POA: Diagnosis not present

## 2017-07-10 DIAGNOSIS — M799 Soft tissue disorder, unspecified: Secondary | ICD-10-CM | POA: Diagnosis not present

## 2017-07-17 DIAGNOSIS — S0990XA Unspecified injury of head, initial encounter: Secondary | ICD-10-CM | POA: Diagnosis not present

## 2017-07-17 DIAGNOSIS — S0181XA Laceration without foreign body of other part of head, initial encounter: Secondary | ICD-10-CM | POA: Diagnosis not present

## 2017-07-17 DIAGNOSIS — I4891 Unspecified atrial fibrillation: Secondary | ICD-10-CM | POA: Diagnosis not present

## 2017-07-17 DIAGNOSIS — S0101XA Laceration without foreign body of scalp, initial encounter: Secondary | ICD-10-CM | POA: Diagnosis not present

## 2017-07-21 DIAGNOSIS — M25562 Pain in left knee: Secondary | ICD-10-CM | POA: Diagnosis not present

## 2017-07-21 DIAGNOSIS — S0101XD Laceration without foreign body of scalp, subsequent encounter: Secondary | ICD-10-CM | POA: Diagnosis not present

## 2017-07-26 DIAGNOSIS — H903 Sensorineural hearing loss, bilateral: Secondary | ICD-10-CM | POA: Diagnosis not present

## 2017-07-26 DIAGNOSIS — H6123 Impacted cerumen, bilateral: Secondary | ICD-10-CM | POA: Diagnosis not present

## 2017-08-04 DIAGNOSIS — J342 Deviated nasal septum: Secondary | ICD-10-CM | POA: Diagnosis not present

## 2017-08-04 DIAGNOSIS — H9313 Tinnitus, bilateral: Secondary | ICD-10-CM | POA: Diagnosis not present

## 2017-08-04 DIAGNOSIS — H9123 Sudden idiopathic hearing loss, bilateral: Secondary | ICD-10-CM | POA: Diagnosis not present

## 2017-08-04 DIAGNOSIS — H903 Sensorineural hearing loss, bilateral: Secondary | ICD-10-CM | POA: Diagnosis not present

## 2017-10-13 DIAGNOSIS — N401 Enlarged prostate with lower urinary tract symptoms: Secondary | ICD-10-CM | POA: Diagnosis not present

## 2017-10-13 DIAGNOSIS — R972 Elevated prostate specific antigen [PSA]: Secondary | ICD-10-CM | POA: Diagnosis not present

## 2017-10-13 DIAGNOSIS — R3912 Poor urinary stream: Secondary | ICD-10-CM | POA: Diagnosis not present

## 2017-10-26 DIAGNOSIS — L720 Epidermal cyst: Secondary | ICD-10-CM | POA: Diagnosis not present

## 2017-10-27 DIAGNOSIS — R3914 Feeling of incomplete bladder emptying: Secondary | ICD-10-CM | POA: Diagnosis not present

## 2017-10-27 DIAGNOSIS — R972 Elevated prostate specific antigen [PSA]: Secondary | ICD-10-CM | POA: Diagnosis not present

## 2017-11-23 DIAGNOSIS — Z23 Encounter for immunization: Secondary | ICD-10-CM | POA: Diagnosis not present

## 2017-11-23 DIAGNOSIS — M79604 Pain in right leg: Secondary | ICD-10-CM | POA: Diagnosis not present

## 2017-12-01 DIAGNOSIS — M1711 Unilateral primary osteoarthritis, right knee: Secondary | ICD-10-CM | POA: Diagnosis not present

## 2017-12-08 ENCOUNTER — Other Ambulatory Visit: Payer: Self-pay | Admitting: Surgical

## 2017-12-08 DIAGNOSIS — M48061 Spinal stenosis, lumbar region without neurogenic claudication: Secondary | ICD-10-CM

## 2017-12-08 DIAGNOSIS — M1711 Unilateral primary osteoarthritis, right knee: Secondary | ICD-10-CM | POA: Diagnosis not present

## 2017-12-08 DIAGNOSIS — M25561 Pain in right knee: Secondary | ICD-10-CM | POA: Diagnosis not present

## 2017-12-11 ENCOUNTER — Telehealth: Payer: Self-pay | Admitting: Nurse Practitioner

## 2017-12-11 ENCOUNTER — Telehealth: Payer: Self-pay

## 2017-12-11 NOTE — Telephone Encounter (Signed)
   [facility] Pre-operative Risk Assessment    Request for surgical clearance:  1. What type of surgery is being performed?  MYELOGRAM   2. When is this surgery scheduled?  TBD   3. What type of clearance is required (medical clearance vs. Pharmacy clearance to hold med vs. Both)?  both  4. Are there any medications that need to be held prior to surgery and how long? pradaxa 48 hours   5. Practice name and name of physician performing surgery?  [facility]   6. What is your office phone number [phone]    7.   What is your office fax number [phone]  8.   Anesthesia type (None, local, MAC, general) ?     [place]  Allexa Acoff 12/11/2017, 2:03 PM  _________________________________________________________________   (provider comments below)

## 2017-12-11 NOTE — Telephone Encounter (Signed)
Phone call to patient to verify medication list and allergies for myelogram procedure. Pt instructed to hold pradaxa (pending Dr. Thompson Caul approval) for 48hrs prior to myelogram appointment time. Pt verbalized understanding. Sent fax for thinner hold request, awaiting response from Dr. Tamala Julian.

## 2017-12-13 ENCOUNTER — Other Ambulatory Visit: Payer: Self-pay | Admitting: Internal Medicine

## 2017-12-18 NOTE — Telephone Encounter (Signed)
Follow up     Jeanie from [facility] is calling to follow up on clearance for pt.

## 2017-12-19 NOTE — Telephone Encounter (Signed)
   Primary Cardiologist: EP Dr Rayann Heman  Chart reviewed as part of pre-operative protocol coverage. Will route to pharm for input on Pradaxa then pt will need call since last OV 12/2016. Callback staff please let [facility] know of backlog and we will send as soon as clearance processed.  Charlie Pitter, PA-C 12/19/2017, 4:54 PM

## 2017-12-20 NOTE — Telephone Encounter (Signed)
   Primary Cardiologist:Dr Tamala Julian  Chart reviewed as part of pre-operative protocol coverage. Because of Brian Myers's past medical history and time since last visit, he/she will require a follow-up visit in order to better assess preoperative cardiovascular risk.  Pre-op covering staff: - Please schedule appointment ASAP with Dr Tamala Julian or Dr Thompson Caul APP at [facility]. and call patient to inform them. - Please contact requesting surgeon's office via preferred method (i.e, phone, fax) to inform them of need for appointment prior to surgery.  Kerin Ransom, PA-C  12/20/2017, 4:37 PM

## 2017-12-20 NOTE — Telephone Encounter (Signed)
Patient with diagnosis of atrial fibrillation on Pradaxa for anticoagulation.    Procedure: myelogram Date of procedure: TBD   CHADS2-VASc score of  2 (, HTN, AGE, )  CrCl 137 Platelet count 216  Per office protocol, patient can hold Pradaxa for 3 days prior to procedure.

## 2017-12-21 NOTE — Telephone Encounter (Signed)
Called patient to schedule appt. Left message for patient to contact office.

## 2017-12-22 NOTE — Telephone Encounter (Signed)
Pt have an appt with Bhagat, PA on 10/28 @ 11:00 am

## 2018-01-05 ENCOUNTER — Encounter: Payer: Self-pay | Admitting: Physician Assistant

## 2018-01-08 ENCOUNTER — Ambulatory Visit (INDEPENDENT_AMBULATORY_CARE_PROVIDER_SITE_OTHER): Payer: Medicare Other | Admitting: Physician Assistant

## 2018-01-08 ENCOUNTER — Encounter: Payer: Self-pay | Admitting: Physician Assistant

## 2018-01-08 VITALS — BP 128/80 | HR 70 | Ht 71.0 in | Wt 277.8 lb

## 2018-01-08 DIAGNOSIS — Z01818 Encounter for other preprocedural examination: Secondary | ICD-10-CM

## 2018-01-08 DIAGNOSIS — I10 Essential (primary) hypertension: Secondary | ICD-10-CM

## 2018-01-08 DIAGNOSIS — G4733 Obstructive sleep apnea (adult) (pediatric): Secondary | ICD-10-CM

## 2018-01-08 DIAGNOSIS — I480 Paroxysmal atrial fibrillation: Secondary | ICD-10-CM

## 2018-01-08 DIAGNOSIS — Z9989 Dependence on other enabling machines and devices: Secondary | ICD-10-CM | POA: Diagnosis not present

## 2018-01-08 NOTE — Progress Notes (Signed)
Cardiology Office Note    Date:  01/08/2018   ID:  Brian Myers, DOB 11-13-1945, MRN [id number]  PCP:  Leighton Ruff, MD  Cardiologist:  Dr. Tamala Julian  Electrophysiologist: Dr. Caryl Comes   Chief Complaint: Surgical clearance for myelogram   History of Present Illness:   Brian Myers is a 72 y.o. male with hx of PAF, hypertension, HLD, sleep apnea, obesity and chronic anticoagulation therapy presents for surgical clearance.   Long standing hx of paroxysmal atrial fibrillation which was well controlled on Flecainide until late 2018. The patient was evaluated by Dr. Rayann Heman 01/02/17 for ablation for symptomatic recurrent atrial fibrillation on flecainide. He was unsure and going to contact us if interested.  Continued Flecainide.   Now presenting for clearance. Says intermittent episode of afib. He feels palpitations with severe fatigue. Last for about 5-10 minutes. Occurs 3-4 times/week. Stable for many months. No regular exercise due to severe back and leg pain. However, now during better after therapy in past few weeks. No chest pain. Has orthopnea but uses CPAP every night. No LE edema, syncope, melena or blood in stool or urine. He has intermittent coughing spell that leads to "rash flare up". Follows with dermatologist. No change in medications recently.    Past Medical History:  Diagnosis Date  . Arthritis    KNEES  . Asthma   . BPH (benign prostatic hypertrophy)    DIFFICULTY GETTING STREAM STARTED  . Dysrhythmia    ATRIAL FIB AND FLUTTER-DR. Linard Millers PT STATES HIS HEART IS USUALLY IN RHYTHM - BUT FOR PAST 10 DAYS OR MORE - HE HAS EXPERIENCED PALPITATIONS AND BREATHLESSNESS - HE HAS CALLED DR. Lemmie Evens. [facility]   . Eczema    HANDS  . GERD (gastroesophageal reflux disease)   . H/O hiatal hernia   . Hyperlipidemia   . Hyperlipidemia    DOES NOT TOLERATE ANY OF THE STATINS  . Persistent atrial fibrillation    Dr Tamala Julian  . Pneumonia    LAST EPISODE JAN 2015  . Right  knee meniscal tear    PAIN RT KNEE  . Shortness of breath    WHEN HEART BEAT IRREGULAR  . Sleep apnea    with cpap    Past Surgical History:  Procedure Laterality Date  . CERVICAL FUSION  1991  . KNEE ARTHROSCOPY Right 04/17/2013   Procedure: ARTHROSCOPY RIGHT KNEE WITH MEDIAL AND LATERAL DEBRIDEMENT AND CONDRAPLASTY;  Surgeon: Gearlean Alf, MD;  Location: [facility];  Service: Orthopedics;  Laterality: Right;  . LEFT KNEE ARTHROSCPY  2009    Current Medications: Prior to Admission medications   Medication Sig Start Date End Date Taking? Authorizing Provider  albuterol (PROVENTIL HFA;VENTOLIN HFA) 108 (90 BASE) MCG/ACT inhaler Inhale 2 puffs into the lungs every 6 (six) hours as needed for wheezing or shortness of breath.     [provider]  betamethasone dipropionate (DIPROLENE) 0.05 % ointment  01/23/17   [provider]  budesonide (PULMICORT) 180 MCG/ACT inhaler Inhale 1 puff into the lungs 2 (two) times daily.     [provider]  dabigatran (PRADAXA) 150 MG CAPS capsule TAKE ONE CAPSULE BY MOUTH EVERY 12 HOURS 12/13/17   Deboraha Sprang, MD  diltiazem (CARDIZEM CD) 360 MG 24 hr capsule TAKE 1 CAPSULE BY MOUTH EVERY DAY IN THE EVENING 12/13/17   Deboraha Sprang, MD  esomeprazole (NEXIUM) 40 MG capsule Take 40 mg by mouth daily. 10/25/15   [provider]  flecainide (  TAMBOCOR) 100 MG tablet TAKE 1 TABLET BY MOUTH TWICE A DAY 12/13/17   Deboraha Sprang, MD  fluticasone [employer]) 50 MCG/ACT nasal spray Place 1 spray into both nostrils daily.     [provider]  halobetasol (ULTRAVATE) 0.05 % ointment Apply 1 application 2 (two) times daily topically. 12/22/16   [provider]  methylcellulose (ARTIFICIAL TEARS) 1 % ophthalmic solution Place 1 drop into both eyes 2 (two) times daily as needed (dry eyes).    [provider]  salmeterol (SEREVENT) 50 MCG/DOSE diskus inhaler Inhale 1 puff into the lungs 2 (two) times daily.      [provider]    Allergies:   Penicillins; Sulfa drugs cross reactors; and Statins   Social History   Socioeconomic History  . Marital status: Married    Spouse name: Not on file  . Number of children: 2  . Years of education: Not on file  . Highest education level: Not on file  Occupational History    Employer: [employer]  Social Needs  . Financial resource strain: Not on file  . Food insecurity:    Worry: Not on file    Inability: Not on file  . Transportation needs:    Medical: Not on file    Non-medical: Not on file  Tobacco Use  . Smoking status: Never Smoker  . Smokeless tobacco: Never Used  Substance and Sexual Activity  . Alcohol use: Yes    Comment: at least 2-3 glasses of wine per day  . Drug use: No  . Sexual activity: Not on file  Lifestyle  . Physical activity:    Days per week: Not on file    Minutes per session: Not on file  . Stress: Not on file  Relationships  . Social connections:    Talks on phone: Not on file    Gets together: Not on file    Attends religious service: Not on file    Active member of club or organization: Not on file    Attends meetings of clubs or organizations: Not on file    Relationship status: Not on file  Other Topics Concern  . Not on file  Social History Narrative   Lives in [place] with spouse.  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     Family History:  The patient's family history includes Arrhythmia in his mother; Hypertension in his father; Mitral valve prolapse in his mother.   ROS:   Please see the history of present illness.    ROS All other systems reviewed and are negative.   PHYSICAL EXAM:   VS:  BP 128/80   Pulse 70   Ht 5\' 11"  (1.803 m)   Wt 277 lb 12.8 oz (126 kg)   SpO2 97%   BMI 38.75 kg/m    GEN: Well nourished, well developed, in no acute distress  HEENT: normal  Neck: no JVD, carotid bruits, or masses Cardiac: RRR; no murmurs, rubs, or gallops,no edema    Respiratory:  clear to auscultation bilaterally, normal work of breathing GI: soft, nontender, nondistended, + BS MS: no deformity or atrophy  Skin: warm and dry, no rash Neuro:  Alert and Oriented x 3, Strength and sensation are intact Psych: euthymic mood, full affect  Wt Readings from Last 3 Encounters:  01/08/18 277 lb 12.8 oz (126 kg)  01/25/17 280 lb (127 kg)  01/02/17 276 lb 12.8 oz (125.6 kg)      Studies/Labs  Reviewed:   EKG:  EKG is ordered today.  The ekg ordered today demonstrates NSR at rate of 64 bpm  Recent Labs: No results found for requested labs within last 8760 hours.   Lipid Panel No results found for: CHOL, TRIG, HDL, CHOLHDL, VLDL, LDLCALC, LDLDIRECT  Additional studies/ records that were reviewed today include:   Echocardiogram: 08/2016 Study Conclusions  - Left ventricle: The cavity size was normal. Wall thickness was   normal. Systolic function was normal. The estimated ejection   fraction was in the range of 60% to 65%. Wall motion was normal;   there were no regional wall motion abnormalities. Features are   consistent with a pseudonormal left ventricular filling pattern,   with concomitant abnormal relaxation and increased filling   pressure (grade 2 diastolic dysfunction). - Right atrium: The atrium was mildly dilated.   ASSESSMENT & PLAN:    1. PAF/flutter - Evaluated by Dr. Rayann Heman 12/2016 for symptomatic afib.Liana Gerold ablation but was unsure.  - He continues to have stable intermittent symptomatic afib on cardizem and flecainide. No exacerbation.  - No change in therapy.   2. HTN - BP stable on current medications  3. Surgical clearance  - he is getting 4 mets of activity. Echo normal Last year. Reviewed personally with Dr. Tamala Julian given intermittent symptomatic afib  (3-4 times/week, each episode last for 5-10 minutes), this been stable for years. He will be at risk for prolonged arrhythmias. No prior hx of stroke. CHADSVASC score  of 2 for age and HTN. Per pharmacy "patient can hold Pradaxa for 3 days prior to procedure".   Given past medical history and  based on ACC/AHA guidelines, Brian Myers would be at acceptable risk for the planned procedure without further cardiovascular testing.   I will route this recommendation to the requesting party via Epic fax function and remove from pre-op pool.  Please call with questions.  4. OSA on CPAP - Compliant  Medication Adjustments/Labs and Tests Ordered: Current medicines are reviewed at length with the patient today.  Concerns regarding medicines are outlined above.  Medication changes, Labs and Tests ordered today are listed in the Patient Instructions below. Patient Instructions  Medication Instructions:  Your physician recommends that you continue on your current medications as directed. Please refer to the Current Medication list given to you today.  If you need a refill on your cardiac medications before your next appointment, please call your pharmacy.   Lab work: None ordered  If you have labs (blood work) drawn today and your tests are completely normal, you will receive your results only by: Marland Kitchen MyChart Message (if you have MyChart) OR . A paper copy in the mail If you have any lab test that is abnormal or we need to change your treatment, we will call you to review the results.  Testing/Procedures: None ordered  Follow-Up: Your physician recommends that you schedule a follow-up appointment in: [place]    At [facility], you and your health needs are our priority.  As part of our continuing mission to provide you with exceptional heart care, we have created designated Provider Care Teams.  These Care Teams include your primary Cardiologist (physician) and Advanced Practice Providers (APPs -  Physician Assistants and Nurse Practitioners) who all work together to provide you with the care you need, when you need it. You will need a  follow up appointment in 6 months.  Please call our office 2 months in  advance to schedule this appointment.  You may see Sinclair Grooms, MD or one of the following Advanced Practice Providers on your designated Care Team:   Truitt Merle, NP Cecilie Kicks, NP . Kathyrn Drown, NP  Any Other Special Instructions Will Be Listed Below (If Applicable).       Jarrett Soho, [place]  01/08/2018 11:44 AM    [facility] [address], [place], [place]  [address] Phone: [phone]; Fax: [phone]

## 2018-01-08 NOTE — Patient Instructions (Signed)
Medication Instructions:  Your physician recommends that you continue on your current medications as directed. Please refer to the Current Medication list given to you today.  If you need a refill on your cardiac medications before your next appointment, please call your pharmacy.   Lab work: None ordered  If you have labs (blood work) drawn today and your tests are completely normal, you will receive your results only by: Marland Kitchen MyChart Message (if you have MyChart) OR . A paper copy in the mail If you have any lab test that is abnormal or we need to change your treatment, we will call you to review the results.  Testing/Procedures: None ordered  Follow-Up: Your physician recommends that you schedule a follow-up appointment in: [place]    At [facility], you and your health needs are our priority.  As part of our continuing mission to provide you with exceptional heart care, we have created designated Provider Care Teams.  These Care Teams include your primary Cardiologist (physician) and Advanced Practice Providers (APPs -  Physician Assistants and Nurse Practitioners) who all work together to provide you with the care you need, when you need it. You will need a follow up appointment in 6 months.  Please call our office 2 months in advance to schedule this appointment.  You may see Sinclair Grooms, MD or one of the following Advanced Practice Providers on your designated Care Team:   Truitt Merle, NP Cecilie Kicks, NP . Kathyrn Drown, NP  Any Other Special Instructions Will Be Listed Below (If Applicable).

## 2018-01-19 DIAGNOSIS — L918 Other hypertrophic disorders of the skin: Secondary | ICD-10-CM | POA: Diagnosis not present

## 2018-01-19 DIAGNOSIS — L821 Other seborrheic keratosis: Secondary | ICD-10-CM | POA: Diagnosis not present

## 2018-01-19 DIAGNOSIS — L218 Other seborrheic dermatitis: Secondary | ICD-10-CM | POA: Diagnosis not present

## 2018-01-19 DIAGNOSIS — D1801 Hemangioma of skin and subcutaneous tissue: Secondary | ICD-10-CM | POA: Diagnosis not present

## 2018-01-19 DIAGNOSIS — L308 Other specified dermatitis: Secondary | ICD-10-CM | POA: Diagnosis not present

## 2018-01-27 ENCOUNTER — Encounter: Payer: Self-pay | Admitting: Adult Health

## 2018-01-29 ENCOUNTER — Ambulatory Visit (INDEPENDENT_AMBULATORY_CARE_PROVIDER_SITE_OTHER): Payer: Medicare Other | Admitting: Adult Health

## 2018-01-29 ENCOUNTER — Encounter: Payer: Self-pay | Admitting: Adult Health

## 2018-01-29 VITALS — BP 140/75 | HR 64 | Ht 71.0 in | Wt 283.6 lb

## 2018-01-29 DIAGNOSIS — Z9989 Dependence on other enabling machines and devices: Secondary | ICD-10-CM

## 2018-01-29 DIAGNOSIS — G4733 Obstructive sleep apnea (adult) (pediatric): Secondary | ICD-10-CM

## 2018-01-29 NOTE — Progress Notes (Signed)
PATIENT: Brian Myers DOB: 05-26-1945  REASON FOR VISIT: follow up HISTORY FROM: patient  HISTORY OF PRESENT ILLNESS: Today 01/29/18:  Brian Myers is a 72 year old male with a history of obstructive sleep apnea on CPAP.  His CPAP download indicates that he use his machine nightly for compliance of 100%.  He uses machine greater than 4 hours every night.  On average he uses his machine 8 hours and 5 minutes.  His residual AHI is 2.8 on 8 to 15 cm of water with EPR of 3.  He does not have a significant leak.  He reports that the CPAP continues to work well for him.  He returns today for evaluation.  HISTORY (Copied from Dr.Dohmeier's note)have the pleasure of seeing Brian Myers today on 25 January 2017, he has been a long established CPAP patient in our practice with 100% compliance by days and time of CPAP use again.  Data from 26 November reveal 8 hours and 2 minutes on average use of time, the patient uses an AutoSet between 8 and 15 cm water pressure with 3 cm expiratory pressure relief.  Maximum air leak is only 7.6 L/min, the 95th percentile pressure is 14.9 cm water, and his residual AHI is 3.2.  He does not show treatment emergent central apneas.  We could leave all settings as are currently.   I have noticed a facial rash which is followed by dermatology, it does not interfere with his CPAP interface.   REVIEW OF SYSTEMS: Out of a complete 14 system review of symptoms, the patient complains only of the following symptoms, and all other reviewed systems are negative.  Epworth sleepiness score 4  ALLERGIES: Allergies  Allergen Reactions  . Penicillins Anaphylaxis  . Sulfa Drugs Cross Reactors Anaphylaxis  . Statins Other (See Comments)    INTOLERANT TO STATINS - SEVERE MUSCLE CRAMPS    HOME MEDICATIONS: Outpatient Medications Prior to Visit  Medication Sig Dispense Refill  . albuterol (PROVENTIL HFA;VENTOLIN HFA) 108 (90 BASE) MCG/ACT inhaler Inhale 2 puffs into the  lungs every 6 (six) hours as needed for wheezing or shortness of breath.     . budesonide (PULMICORT) 180 MCG/ACT inhaler Inhale 1 puff into the lungs 2 (two) times daily.     . dabigatran (PRADAXA) 150 MG CAPS capsule TAKE ONE CAPSULE BY MOUTH EVERY 12 HOURS 180 capsule 0  . diltiazem (CARDIZEM CD) 360 MG 24 hr capsule TAKE 1 CAPSULE BY MOUTH EVERY DAY IN THE EVENING 90 capsule 0  . esomeprazole (NEXIUM) 40 MG capsule Take 40 mg by mouth daily.  1  . flecainide (TAMBOCOR) 100 MG tablet TAKE 1 TABLET BY MOUTH TWICE A DAY 180 tablet 0  . fluticasone (FLONASE) 50 MCG/ACT nasal spray Place 1 spray into both nostrils daily as needed for allergies.     . methylcellulose (ARTIFICIAL TEARS) 1 % ophthalmic solution Place 1 drop into both eyes 2 (two) times daily as needed (dry eyes).    . salmeterol (SEREVENT) 50 MCG/DOSE diskus inhaler Inhale 1 puff into the lungs 2 (two) times daily.      No facility-administered medications prior to visit.     PAST MEDICAL HISTORY: Past Medical History:  Diagnosis Date  . Arthritis    KNEES  . Asthma   . BPH (benign prostatic hypertrophy)    DIFFICULTY GETTING STREAM STARTED  . Dysrhythmia    ATRIAL FIB AND FLUTTER-DR. Alba IS USUALLY IN RHYTHM - BUT FOR  PAST 10 DAYS OR MORE - HE HAS EXPERIENCED PALPITATIONS AND BREATHLESSNESS - HE HAS CALLED DR. Lemmie Evens. [facility]   . Eczema    HANDS  . GERD (gastroesophageal reflux disease)   . H/O hiatal hernia   . Hyperlipidemia   . Hyperlipidemia    DOES NOT TOLERATE ANY OF THE STATINS  . Persistent atrial fibrillation    Dr Tamala Julian  . Pneumonia    LAST EPISODE JAN 2015  . Right knee meniscal tear    PAIN RT KNEE  . Shortness of breath    WHEN HEART BEAT IRREGULAR  . Sleep apnea    with cpap    PAST SURGICAL HISTORY: Past Surgical History:  Procedure Laterality Date  . CERVICAL FUSION  1991  . KNEE ARTHROSCOPY Right 04/17/2013   Procedure: ARTHROSCOPY RIGHT KNEE WITH MEDIAL AND  LATERAL DEBRIDEMENT AND CONDRAPLASTY;  Surgeon: Gearlean Alf, MD;  Location: [facility];  Service: Orthopedics;  Laterality: Right;  . LEFT KNEE ARTHROSCPY  2009    FAMILY HISTORY: Family History  Problem Relation Age of Onset  . Arrhythmia Mother        atrial fib  . Mitral valve prolapse Mother   . Hypertension Father     SOCIAL HISTORY: Social History   Socioeconomic History  . Marital status: Married    Spouse name: Not on file  . Number of children: 2  . Years of education: Not on file  . Highest education level: Not on file  Occupational History    Employer: [employer]  Social Needs  . Financial resource strain: Not on file  . Food insecurity:    Worry: Not on file    Inability: Not on file  . Transportation needs:    Medical: Not on file    Non-medical: Not on file  Tobacco Use  . Smoking status: Never Smoker  . Smokeless tobacco: Never Used  Substance and Sexual Activity  . Alcohol use: Yes    Comment: at least 2-3 glasses of wine per day  . Drug use: No  . Sexual activity: Not on file  Lifestyle  . Physical activity:    Days per week: Not on file    Minutes per session: Not on file  . Stress: Not on file  Relationships  . Social connections:    Talks on phone: Not on file    Gets together: Not on file    Attends religious service: Not on file    Active member of club or organization: Not on file    Attends meetings of clubs or organizations: Not on file    Relationship status: Not on file  . Intimate partner violence:    Fear of current or ex partner: Not on file    Emotionally abused: Not on file    Physically abused: Not on file    Forced sexual activity: Not on file  Other Topics Concern  . Not on file  Social History Narrative   Lives in [place] with spouse.  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      PHYSICAL EXAM  Vitals:   01/29/18 1415  BP: 140/75  Pulse: 64  Weight: 283 lb 9.6 oz (128.6 kg)  Height: 5\' 11"  (1.803  m)   Body mass index is 39.55 kg/m.  Generalized: Well developed, in no acute distress  Chest: Lungs clear to auscultation  Neurological examination  Mentation: Alert oriented to time, place, history taking. Follows all commands speech  and language fluent Cranial nerve II-XII: Pupils were equal round reactive to light. Extraocular movements were full, visual field were full on confrontational test. Facial sensation and strength were normal. Uvula tongue midline. Head turning and shoulder shrug  were normal and symmetric.  Neck circumference 19 inches, Mallampati 3+ Motor: The motor testing reveals 5 over 5 strength of all 4 extremities. Good symmetric motor tone is noted throughout.  Sensory: Sensory testing is intact to soft touch on all 4 extremities. No evidence of extinction is noted.  Gait and station: Gait is normal.   DIAGNOSTIC DATA (LABS, IMAGING, TESTING) - I reviewed patient records, labs, notes, testing and imaging myself where available.  Lab Results  Component Value Date   WBC 8.3 04/12/2013   HGB 14.0 11/27/2015   HCT 35.9 (L) 06/07/2013   MCV 83.8 04/12/2013   PLT 297 04/12/2013      Component Value Date/Time   NA 141 11/27/2015 1442   K 3.5 11/27/2015 1442   CL 104 11/27/2015 1442   CO2 22 11/27/2015 1442   GLUCOSE 98 11/27/2015 1442   BUN 13 11/27/2015 1442   CREATININE 0.98 11/27/2015 1442   CALCIUM 8.7 11/27/2015 1442   ALBUMIN 3.5 06/07/2013 1340   GFRNONAA 85 (L) 04/12/2013 1500   GFRAA >90 04/12/2013 1500      ASSESSMENT AND PLAN 72 y.o. year old male  has a past medical history of Arthritis, Asthma, BPH (benign prostatic hypertrophy), Dysrhythmia, Eczema, GERD (gastroesophageal reflux disease), H/O hiatal hernia, Hyperlipidemia, Hyperlipidemia, Persistent atrial fibrillation, Pneumonia, Right knee meniscal tear, Shortness of breath, and Sleep apnea. here with:  1.  Obstructive sleep apnea on CPAP  The patient CPAP download shows excellent  compliance and good treatment of his apnea.  He is encouraged to continue using the CPAP nightly and greater than 4 hours each night.  He is advised that if his symptoms worsen or he develops new symptoms he should let us know.  He will follow-up in 1 year or sooner if needed.   I spent 15 minutes with the patient. 50% of this time was spent reviewing CPAP download   Ward Givens, MSN, NP-C 01/29/2018, 2:31 PM [facility] [address] [place], [place] [address] [phone]

## 2018-01-29 NOTE — Patient Instructions (Signed)

## 2018-02-12 DIAGNOSIS — M79606 Pain in leg, unspecified: Secondary | ICD-10-CM | POA: Diagnosis not present

## 2018-02-12 DIAGNOSIS — R7301 Impaired fasting glucose: Secondary | ICD-10-CM | POA: Diagnosis not present

## 2018-02-12 DIAGNOSIS — I10 Essential (primary) hypertension: Secondary | ICD-10-CM | POA: Diagnosis not present

## 2018-02-12 DIAGNOSIS — E7800 Pure hypercholesterolemia, unspecified: Secondary | ICD-10-CM | POA: Diagnosis not present

## 2018-02-12 DIAGNOSIS — J45909 Unspecified asthma, uncomplicated: Secondary | ICD-10-CM | POA: Diagnosis not present

## 2018-03-14 ENCOUNTER — Other Ambulatory Visit: Payer: Self-pay | Admitting: Internal Medicine

## 2018-03-20 ENCOUNTER — Other Ambulatory Visit: Payer: Self-pay

## 2018-03-20 DIAGNOSIS — H2513 Age-related nuclear cataract, bilateral: Secondary | ICD-10-CM | POA: Diagnosis not present

## 2018-03-20 MED ORDER — DILTIAZEM HCL ER COATED BEADS 360 MG PO CP24: ORAL_CAPSULE | ORAL | 2 refills | Status: DC

## 2018-03-23 DIAGNOSIS — M4150 Other secondary scoliosis, site unspecified: Secondary | ICD-10-CM | POA: Insufficient documentation

## 2018-03-23 DIAGNOSIS — M48061 Spinal stenosis, lumbar region without neurogenic claudication: Secondary | ICD-10-CM | POA: Diagnosis not present

## 2018-03-23 DIAGNOSIS — M4180 Other forms of scoliosis, site unspecified: Secondary | ICD-10-CM | POA: Diagnosis not present

## 2018-03-26 ENCOUNTER — Other Ambulatory Visit: Payer: Self-pay | Admitting: Internal Medicine

## 2018-03-26 NOTE — Telephone Encounter (Signed)
Pt last saw Robbie Lis, PA on 01/08/18, last labs 10/13/17 Creat 0.8 at [facility], age 73, weight 128.6kg, CrCl 156.28, based on CrCl pt is on appropriate dosage of Pradaxa 150mg  BID.  Will refill rx.

## 2018-03-28 DIAGNOSIS — M545 Low back pain: Secondary | ICD-10-CM | POA: Diagnosis not present

## 2018-03-29 DIAGNOSIS — M62830 Muscle spasm of back: Secondary | ICD-10-CM | POA: Diagnosis not present

## 2018-03-29 DIAGNOSIS — M799 Soft tissue disorder, unspecified: Secondary | ICD-10-CM | POA: Diagnosis not present

## 2018-03-29 DIAGNOSIS — M545 Low back pain: Secondary | ICD-10-CM | POA: Diagnosis not present

## 2018-03-29 DIAGNOSIS — M5416 Radiculopathy, lumbar region: Secondary | ICD-10-CM | POA: Diagnosis not present

## 2018-04-02 DIAGNOSIS — M799 Soft tissue disorder, unspecified: Secondary | ICD-10-CM | POA: Diagnosis not present

## 2018-04-02 DIAGNOSIS — M62830 Muscle spasm of back: Secondary | ICD-10-CM | POA: Diagnosis not present

## 2018-04-02 DIAGNOSIS — M5416 Radiculopathy, lumbar region: Secondary | ICD-10-CM | POA: Diagnosis not present

## 2018-04-02 DIAGNOSIS — M545 Low back pain: Secondary | ICD-10-CM | POA: Diagnosis not present

## 2018-04-04 DIAGNOSIS — M62830 Muscle spasm of back: Secondary | ICD-10-CM | POA: Diagnosis not present

## 2018-04-04 DIAGNOSIS — M545 Low back pain: Secondary | ICD-10-CM | POA: Diagnosis not present

## 2018-04-04 DIAGNOSIS — M5416 Radiculopathy, lumbar region: Secondary | ICD-10-CM | POA: Diagnosis not present

## 2018-04-04 DIAGNOSIS — M799 Soft tissue disorder, unspecified: Secondary | ICD-10-CM | POA: Diagnosis not present

## 2018-04-06 DIAGNOSIS — M62830 Muscle spasm of back: Secondary | ICD-10-CM | POA: Diagnosis not present

## 2018-04-06 DIAGNOSIS — M545 Low back pain: Secondary | ICD-10-CM | POA: Diagnosis not present

## 2018-04-06 DIAGNOSIS — M799 Soft tissue disorder, unspecified: Secondary | ICD-10-CM | POA: Diagnosis not present

## 2018-04-06 DIAGNOSIS — M5416 Radiculopathy, lumbar region: Secondary | ICD-10-CM | POA: Diagnosis not present

## 2018-04-16 ENCOUNTER — Ambulatory Visit: Payer: Medicare Other | Admitting: Internal Medicine

## 2018-04-16 DIAGNOSIS — M799 Soft tissue disorder, unspecified: Secondary | ICD-10-CM | POA: Diagnosis not present

## 2018-04-16 DIAGNOSIS — M62830 Muscle spasm of back: Secondary | ICD-10-CM | POA: Diagnosis not present

## 2018-04-16 DIAGNOSIS — M5416 Radiculopathy, lumbar region: Secondary | ICD-10-CM | POA: Diagnosis not present

## 2018-04-16 DIAGNOSIS — M545 Low back pain: Secondary | ICD-10-CM | POA: Diagnosis not present

## 2018-04-18 DIAGNOSIS — M799 Soft tissue disorder, unspecified: Secondary | ICD-10-CM | POA: Diagnosis not present

## 2018-04-18 DIAGNOSIS — M5416 Radiculopathy, lumbar region: Secondary | ICD-10-CM | POA: Diagnosis not present

## 2018-04-18 DIAGNOSIS — M545 Low back pain: Secondary | ICD-10-CM | POA: Diagnosis not present

## 2018-04-18 DIAGNOSIS — M62830 Muscle spasm of back: Secondary | ICD-10-CM | POA: Diagnosis not present

## 2018-04-20 DIAGNOSIS — M545 Low back pain: Secondary | ICD-10-CM | POA: Diagnosis not present

## 2018-04-20 DIAGNOSIS — M5416 Radiculopathy, lumbar region: Secondary | ICD-10-CM | POA: Diagnosis not present

## 2018-04-20 DIAGNOSIS — M799 Soft tissue disorder, unspecified: Secondary | ICD-10-CM | POA: Diagnosis not present

## 2018-04-20 DIAGNOSIS — M62830 Muscle spasm of back: Secondary | ICD-10-CM | POA: Diagnosis not present

## 2018-04-24 DIAGNOSIS — M799 Soft tissue disorder, unspecified: Secondary | ICD-10-CM | POA: Diagnosis not present

## 2018-04-24 DIAGNOSIS — R972 Elevated prostate specific antigen [PSA]: Secondary | ICD-10-CM | POA: Diagnosis not present

## 2018-04-24 DIAGNOSIS — M5416 Radiculopathy, lumbar region: Secondary | ICD-10-CM | POA: Diagnosis not present

## 2018-04-24 DIAGNOSIS — M62830 Muscle spasm of back: Secondary | ICD-10-CM | POA: Diagnosis not present

## 2018-04-24 DIAGNOSIS — M545 Low back pain: Secondary | ICD-10-CM | POA: Diagnosis not present

## 2018-04-26 DIAGNOSIS — M5416 Radiculopathy, lumbar region: Secondary | ICD-10-CM | POA: Diagnosis not present

## 2018-04-26 DIAGNOSIS — M545 Low back pain: Secondary | ICD-10-CM | POA: Diagnosis not present

## 2018-04-26 DIAGNOSIS — M799 Soft tissue disorder, unspecified: Secondary | ICD-10-CM | POA: Diagnosis not present

## 2018-04-26 DIAGNOSIS — M62830 Muscle spasm of back: Secondary | ICD-10-CM | POA: Diagnosis not present

## 2018-04-27 DIAGNOSIS — M799 Soft tissue disorder, unspecified: Secondary | ICD-10-CM | POA: Diagnosis not present

## 2018-04-27 DIAGNOSIS — M5416 Radiculopathy, lumbar region: Secondary | ICD-10-CM | POA: Diagnosis not present

## 2018-04-27 DIAGNOSIS — M62830 Muscle spasm of back: Secondary | ICD-10-CM | POA: Diagnosis not present

## 2018-04-27 DIAGNOSIS — M545 Low back pain: Secondary | ICD-10-CM | POA: Diagnosis not present

## 2018-04-30 DIAGNOSIS — M799 Soft tissue disorder, unspecified: Secondary | ICD-10-CM | POA: Diagnosis not present

## 2018-04-30 DIAGNOSIS — M545 Low back pain: Secondary | ICD-10-CM | POA: Diagnosis not present

## 2018-04-30 DIAGNOSIS — M5416 Radiculopathy, lumbar region: Secondary | ICD-10-CM | POA: Diagnosis not present

## 2018-04-30 DIAGNOSIS — M62830 Muscle spasm of back: Secondary | ICD-10-CM | POA: Diagnosis not present

## 2018-05-01 DIAGNOSIS — N401 Enlarged prostate with lower urinary tract symptoms: Secondary | ICD-10-CM | POA: Diagnosis not present

## 2018-05-01 DIAGNOSIS — R3914 Feeling of incomplete bladder emptying: Secondary | ICD-10-CM | POA: Diagnosis not present

## 2018-05-01 DIAGNOSIS — R972 Elevated prostate specific antigen [PSA]: Secondary | ICD-10-CM | POA: Diagnosis not present

## 2018-05-02 DIAGNOSIS — M545 Low back pain: Secondary | ICD-10-CM | POA: Diagnosis not present

## 2018-05-02 DIAGNOSIS — M799 Soft tissue disorder, unspecified: Secondary | ICD-10-CM | POA: Diagnosis not present

## 2018-05-02 DIAGNOSIS — M5416 Radiculopathy, lumbar region: Secondary | ICD-10-CM | POA: Diagnosis not present

## 2018-05-02 DIAGNOSIS — M62830 Muscle spasm of back: Secondary | ICD-10-CM | POA: Diagnosis not present

## 2018-05-03 DIAGNOSIS — M5416 Radiculopathy, lumbar region: Secondary | ICD-10-CM | POA: Diagnosis not present

## 2018-05-03 DIAGNOSIS — M62830 Muscle spasm of back: Secondary | ICD-10-CM | POA: Diagnosis not present

## 2018-05-03 DIAGNOSIS — M799 Soft tissue disorder, unspecified: Secondary | ICD-10-CM | POA: Diagnosis not present

## 2018-05-03 DIAGNOSIS — M545 Low back pain: Secondary | ICD-10-CM | POA: Diagnosis not present

## 2018-05-07 DIAGNOSIS — M62830 Muscle spasm of back: Secondary | ICD-10-CM | POA: Diagnosis not present

## 2018-05-07 DIAGNOSIS — M5416 Radiculopathy, lumbar region: Secondary | ICD-10-CM | POA: Diagnosis not present

## 2018-05-07 DIAGNOSIS — M799 Soft tissue disorder, unspecified: Secondary | ICD-10-CM | POA: Diagnosis not present

## 2018-05-07 DIAGNOSIS — M545 Low back pain: Secondary | ICD-10-CM | POA: Diagnosis not present

## 2018-05-09 DIAGNOSIS — M5416 Radiculopathy, lumbar region: Secondary | ICD-10-CM | POA: Diagnosis not present

## 2018-05-09 DIAGNOSIS — M799 Soft tissue disorder, unspecified: Secondary | ICD-10-CM | POA: Diagnosis not present

## 2018-05-09 DIAGNOSIS — M545 Low back pain: Secondary | ICD-10-CM | POA: Diagnosis not present

## 2018-05-09 DIAGNOSIS — M62830 Muscle spasm of back: Secondary | ICD-10-CM | POA: Diagnosis not present

## 2018-05-10 DIAGNOSIS — M62830 Muscle spasm of back: Secondary | ICD-10-CM | POA: Diagnosis not present

## 2018-05-10 DIAGNOSIS — M545 Low back pain: Secondary | ICD-10-CM | POA: Diagnosis not present

## 2018-05-10 DIAGNOSIS — M799 Soft tissue disorder, unspecified: Secondary | ICD-10-CM | POA: Diagnosis not present

## 2018-05-10 DIAGNOSIS — M5416 Radiculopathy, lumbar region: Secondary | ICD-10-CM | POA: Diagnosis not present

## 2018-07-05 ENCOUNTER — Telehealth: Payer: Self-pay | Admitting: Interventional Cardiology

## 2018-07-05 NOTE — Telephone Encounter (Signed)
Patient set up for MyChart? YES CONSENT SENT THROUGH MYCHART  Is patient using Smartphone/computer/tablet?YES  Did audio/video work?  Does patient need telephone visit?NO  Best phone number to use? [phone]  Special Instructi PATIENT WILL HAVE VITALS AS REQUESTED      Virtual Visit Pre-Appointment Phone Call  "(Name), I am calling you today to discuss your upcoming appointment. We are currently trying to limit exposure to the virus that causes COVID-19 by seeing patients at home rather than in the office."  1. "What is the BEST phone number to call the day of the visit?" - include this in appointment notes  2. Do you have or have access to (through a family member/friend) a smartphone with video capability that we can use for your visit?" a. If yes - list this number in appt notes as cell (if different from BEST phone #) and list the appointment type as a VIDEO visit in appointment notes b. If no - list the appointment type as a PHONE visit in appointment notes  3. Confirm consent - "In the setting of the current Covid19 crisis, you are scheduled for a (phone or video) visit with your provider on (date) at (time).  Just as we do with many in-office visits, in order for you to participate in this visit, we must obtain consent.  If you'd like, I can send this to your mychart (if signed up) or email for you to review.  Otherwise, I can obtain your verbal consent now.  All virtual visits are billed to your insurance company just like a normal visit would be.  By agreeing to a virtual visit, we'd like you to understand that the technology does not allow for your provider to perform an examination, and thus may limit your provider's ability to fully assess your condition. If your provider identifies any concerns that need to be evaluated in person, we will make arrangements to do so.  Finally, though the technology is pretty good, we cannot assure that it will always work on either your or  our end, and in the setting of a video visit, we may have to convert it to a phone-only visit.  In either situation, we cannot ensure that we have a secure connection.  Are you willing to proceed?" STAFF: Did the patient verbally acknowledge consent to telehealth visit? Document YES/NO here: YES   4. Advise patient to be prepared - "Two hours prior to your appointment, go ahead and check your blood pressure, pulse, oxygen saturation, and your weight (if you have the equipment to check those) and write them all down. When your visit starts, your provider will ask you for this information. If you have an Apple Watch or Kardia device, please plan to have heart rate information ready on the day of your appointment. Please have a pen and paper handy nearby the day of the visit as well."  5. Give patient instructions for MyChart download to smartphone OR Doximity/Doxy.me as below if video visit (depending on what platform provider is using)  6. Inform patient they will receive a phone call 15 minutes prior to their appointment time (may be from unknown caller ID) so they should be prepared to answer    TELEPHONE CALL NOTE  Brian Myers has been deemed a candidate for a follow-up tele-health visit to limit community exposure during the Covid-19 pandemic. I spoke with the patient via phone to ensure availability of phone/video source, confirm preferred email & phone number, and  discuss instructions and expectations.  I reminded Brian Myers to be prepared with any vital sign and/or heart rhythm information that could potentially be obtained via home monitoring, at the time of his visit. I reminded Brian Myers to expect a phone call prior to his visit.  Brian Myers 07/05/2018 11:21 AM   INSTRUCTIONS FOR DOWNLOADING THE MYCHART APP TO SMARTPHONE  - The patient must first make sure to have activated MyChart and know their login information - If Apple, go to [employer] and type in MyChart  in the search bar and download the app. If Android, ask patient to go to [employer] and type in [place] in the search bar and download the app. The app is free but as with any other app downloads, their phone may require them to verify saved payment information or Apple/Android password.  - The patient will need to then log into the app with their MyChart username and password, and select [facility] as their healthcare provider to link the account. When it is time for your visit, go to the MyChart app, find appointments, and click Begin Video Visit. Be sure to Select Allow for your device to access the Microphone and Camera for your visit. You will then be connected, and your provider will be with you shortly.  **If they have any issues connecting, or need assistance please contact MyChart service desk [phone])**  **If using a computer, in order to ensure the best quality for their visit they will need to use either of the following Internet Browsers: [employer], or Google Chrome**  IF USING DOXIMITY or DOXY.ME - The patient will receive a link just prior to their visit by text.     FULL LENGTH CONSENT FOR TELE-HEALTH VISIT   I hereby voluntarily request, consent and authorize [facility] and its employed or contracted physicians, physician assistants, nurse practitioners or other licensed health care professionals (the Practitioner), to provide me with telemedicine health care services (the Services") as deemed necessary by the treating Practitioner. I acknowledge and consent to receive the Services by the Practitioner via telemedicine. I understand that the telemedicine visit will involve communicating with the Practitioner through live audiovisual communication technology and the disclosure of certain medical information by electronic transmission. I acknowledge that I have been given the opportunity to request an in-person assessment or other available alternative  prior to the telemedicine visit and am voluntarily participating in the telemedicine visit.  I understand that I have the right to withhold or withdraw my consent to the use of telemedicine in the course of my care at any time, without affecting my right to future care or treatment, and that the Practitioner or I may terminate the telemedicine visit at any time. I understand that I have the right to inspect all information obtained and/or recorded in the course of the telemedicine visit and may receive copies of available information for a reasonable fee.  I understand that some of the potential risks of receiving the Services via telemedicine include:   Delay or interruption in medical evaluation due to technological equipment failure or disruption;  Information transmitted may not be sufficient (e.g. poor resolution of images) to allow for appropriate medical decision making by the Practitioner; and/or   In rare instances, security protocols could fail, causing a breach of personal health information.  Furthermore, I acknowledge that it is my responsibility to provide information about my medical history, conditions and care that is complete and  accurate to the best of my ability. I acknowledge that Practitioner's advice, recommendations, and/or decision may be based on factors not within their control, such as incomplete or inaccurate data provided by me or distortions of diagnostic images or specimens that may result from electronic transmissions. I understand that the practice of medicine is not an exact science and that Practitioner makes no warranties or guarantees regarding treatment outcomes. I acknowledge that I will receive a copy of this consent concurrently upon execution via email to the email address I last provided but may also request a printed copy by calling the office of [facility].    I understand that my insurance will be billed for this visit.   I have read or had this  consent read to me.  I understand the contents of this consent, which adequately explains the benefits and risks of the Services being provided via telemedicine.   I have been provided ample opportunity to ask questions regarding this consent and the Services and have had my questions answered to my satisfaction.  I give my informed consent for the services to be provided through the use of telemedicine in my medical care  By participating in this telemedicine visit I agree to the above.

## 2018-07-09 NOTE — Progress Notes (Signed)
Virtual Visit via Video Note   This visit type was conducted due to national recommendations for restrictions regarding the COVID-19 Pandemic (e.g. social distancing) in an effort to limit this patient's exposure and mitigate transmission in our community.  Due to his co-morbid illnesses, this patient is at least at moderate risk for complications without adequate follow up.  This format is felt to be most appropriate for this patient at this time.  All issues noted in this document were discussed and addressed.  A limited physical exam was performed with this format.  Please refer to the patient's chart for his consent to telehealth for [facility].   Date:  07/11/2018   ID:  Brian Myers, DOB June 21, 1945, MRN [id number]  Patient Location: Home Provider Location: Office  PCP:  Leighton Ruff, MD  Cardiologist:  Sinclair Grooms, MD  Electrophysiologist:  None   Evaluation Performed:  Follow-Up Visit  Chief Complaint:  AF/Htn/CV risk  History of Present Illness:    Brian Myers is a 73 y.o. male with hx of PAF, hypertension, HLD, sleep apnea, obesity and chronic anticoagulation therapy.  Tong is staying in.  He is worried about getting COVID-19.  He has gained weight.  He denies dyspnea and chest pain.  No significant episodes of atrial fibrillation.  He denies lower extremity swelling.  The patient does not have symptoms concerning for COVID-19 infection (fever, chills, cough, or new shortness of breath).    Past Medical History:  Diagnosis Date  . Arthritis    KNEES  . Asthma   . BPH (benign prostatic hypertrophy)    DIFFICULTY GETTING STREAM STARTED  . Dysrhythmia    ATRIAL FIB AND FLUTTER-DR. Linard Millers PT STATES HIS HEART IS USUALLY IN RHYTHM - BUT FOR PAST 10 DAYS OR MORE - HE HAS EXPERIENCED PALPITATIONS AND BREATHLESSNESS - HE HAS CALLED DR. Lemmie Evens. [facility] OFFICE   . Eczema    HANDS  . GERD (gastroesophageal reflux disease)   . H/O hiatal hernia   .  Hyperlipidemia   . Hyperlipidemia    DOES NOT TOLERATE ANY OF THE STATINS  . Persistent atrial fibrillation    Dr Tamala Julian  . Pneumonia    LAST EPISODE JAN 2015  . Right knee meniscal tear    PAIN RT KNEE  . Shortness of breath    WHEN HEART BEAT IRREGULAR  . Sleep apnea    with cpap   Past Surgical History:  Procedure Laterality Date  . CERVICAL FUSION  1991  . KNEE ARTHROSCOPY Right 04/17/2013   Procedure: ARTHROSCOPY RIGHT KNEE WITH MEDIAL AND LATERAL DEBRIDEMENT AND CONDRAPLASTY;  Surgeon: Gearlean Alf, MD;  Location: [facility];  Service: Orthopedics;  Laterality: Right;  . LEFT KNEE ARTHROSCPY  2009     Current Meds  Medication Sig  . albuterol (PROVENTIL HFA;VENTOLIN HFA) 108 (90 BASE) MCG/ACT inhaler Inhale 2 puffs into the lungs every 6 (six) hours as needed for wheezing or shortness of breath.   . budesonide (PULMICORT) 180 MCG/ACT inhaler Inhale 1 puff into the lungs 2 (two) times daily.   . dabigatran (PRADAXA) 150 MG CAPS capsule TAKE 1 CAPSULE BY MOUTH EVERY 12 HOURS  . diltiazem (CARDIZEM CD) 360 MG 24 hr capsule TAKE 1 CAPSULE BY MOUTH EVERY DAY IN THE EVENING  . esomeprazole (NEXIUM) 40 MG capsule Take 40 mg by mouth daily.  . flecainide (TAMBOCOR) 100 MG tablet TAKE 1 TABLET BY MOUTH TWICE A DAY  . fluticasone (FLONASE)  50 MCG/ACT nasal spray Place 1 spray into both nostrils 2 (two) times a day.   . methylcellulose (ARTIFICIAL TEARS) 1 % ophthalmic solution Place 1 drop into both eyes 2 (two) times daily as needed (dry eyes).  . salmeterol (SEREVENT) 50 MCG/DOSE diskus inhaler Inhale 1 puff into the lungs 2 (two) times daily.      Allergies:   Penicillins; Sulfa drugs cross reactors; and Statins   Social History   Tobacco Use  . Smoking status: Never Smoker  . Smokeless tobacco: Never Used  Substance Use Topics  . Alcohol use: Yes    Comment: at least 2-3 glasses of wine per day  . Drug use: No     Family Hx: The patient's family history includes  Arrhythmia in his mother; Hypertension in his father; Mitral valve prolapse in his mother.  ROS:   Please see the history of present illness.    Concerned about the possibility of getting COVID 19 disease.  Not getting much physical activity at all. All other systems reviewed and are negative.   Prior CV studies:   The following studies were reviewed today:  No recent imaging data  Labs/Other Tests and Data Reviewed:    EKG:  No ECG reviewed.  Recent Labs: No results found for requested labs within last 8760 hours.   Recent Lipid Panel No results found for: CHOL, TRIG, HDL, CHOLHDL, LDLCALC, LDLDIRECT  Wt Readings from Last 3 Encounters:  07/11/18 270 lb (122.5 kg)  01/29/18 283 lb 9.6 oz (128.6 kg)  01/08/18 277 lb 12.8 oz (126 kg)     Objective:    Vital Signs:  BP (!) 152/94   Pulse 61   Ht 5\' 11"  (1.803 m)   Wt 270 lb (122.5 kg)   BMI 37.66 kg/m    VITAL SIGNS:  reviewed GEN:  Obese, morbid. RESPIRATORY:  normal respiratory effort, symmetric expansion CARDIOVASCULAR:  no peripheral edema NEURO:  alert and oriented x 3, no obvious focal deficit  ASSESSMENT & PLAN:    1. Paroxysmal atrial fibrillation (HCC)   2. Essential hypertension   3. OSA on CPAP   4. Morbid obesity ([place])   5. Chronic anticoagulation   6. Controlled type 2 diabetes mellitus with other circulatory complication, without long-term current use of insulin ([place])   7. Educated About Covid-19 Virus Infection     PLAN:  1. Reasonable symptomatic control on flecainide.  No change in therapy recommended at this time. 2. Poorly controlled blood pressure especially given diabetes mellitus and chronic anticoagulation.  Start losartan 50 mg/day. 3. Compliance with CPAP is advocated. 4. Encouraged weight loss by undertaking moderate aerobic activity and calorie reduction. 5. Continue Pradaxa.  Watch for bleeding. 6. Elevated hemoglobin A1c's are consistent with type 2 diabetes.  Tighter blood  pressure control is required.  Renal protection is required.  Start losartan 50 mg/day.    COVID-19 Education: The signs and symptoms of COVID-19 were discussed with the patient and how to seek care for testing (follow up with PCP or arrange E-visit).  The importance of social distancing was discussed today.  Time:   Today, I have spent 20 minutes with the patient with telehealth technology discussing the above problems.     Medication Adjustments/Labs and Tests Ordered: Current medicines are reviewed at length with the patient today.  Concerns regarding medicines are outlined above.   Tests Ordered: Orders Placed This Encounter  Procedures  . Basic metabolic panel    Medication Changes: Meds  ordered this encounter  Medications  . losartan (COZAAR) 50 MG tablet    Sig: Take 1 tablet (50 mg total) by mouth daily.    Dispense:  90 tablet    Refill:  3    Disposition:  Follow up in 6 month(s)  Signed, Sinclair Grooms, MD  07/11/2018 3:39 PM    [place]

## 2018-07-11 ENCOUNTER — Telehealth (INDEPENDENT_AMBULATORY_CARE_PROVIDER_SITE_OTHER): Payer: Medicare Other | Admitting: Interventional Cardiology

## 2018-07-11 ENCOUNTER — Encounter: Payer: Self-pay | Admitting: Interventional Cardiology

## 2018-07-11 ENCOUNTER — Other Ambulatory Visit: Payer: Self-pay

## 2018-07-11 VITALS — BP 152/94 | HR 61 | Ht 71.0 in | Wt 270.0 lb

## 2018-07-11 DIAGNOSIS — I480 Paroxysmal atrial fibrillation: Secondary | ICD-10-CM

## 2018-07-11 DIAGNOSIS — I10 Essential (primary) hypertension: Secondary | ICD-10-CM

## 2018-07-11 DIAGNOSIS — G4733 Obstructive sleep apnea (adult) (pediatric): Secondary | ICD-10-CM

## 2018-07-11 DIAGNOSIS — E1159 Type 2 diabetes mellitus with other circulatory complications: Secondary | ICD-10-CM

## 2018-07-11 DIAGNOSIS — Z7189 Other specified counseling: Secondary | ICD-10-CM

## 2018-07-11 DIAGNOSIS — Z9989 Dependence on other enabling machines and devices: Secondary | ICD-10-CM

## 2018-07-11 DIAGNOSIS — Z7901 Long term (current) use of anticoagulants: Secondary | ICD-10-CM

## 2018-07-11 MED ORDER — LOSARTAN POTASSIUM 50 MG PO TABS: 50.0000 mg | ORAL_TABLET | Freq: Every day | ORAL | 3 refills | Status: DC

## 2018-07-11 NOTE — Patient Instructions (Signed)
Medication Instructions:  1) START Losartan 50mg  once daily  If you need a refill on your cardiac medications before your next appointment, please call your pharmacy.   Lab work: Your physician recommends that you return for lab work in: 1 week.  If you have labs (blood work) drawn today and your tests are completely normal, you will receive your results only by: Marland Kitchen MyChart Message (if you have MyChart) OR . A paper copy in the mail If you have any lab test that is abnormal or we need to change your treatment, we will call you to review the results.  Testing/Procedures: None  Follow-Up: At [facility], you and your health needs are our priority.  As part of our continuing mission to provide you with exceptional heart care, we have created designated Provider Care Teams.  These Care Teams include your primary Cardiologist (physician) and Advanced Practice Providers (APPs -  Physician Assistants and Nurse Practitioners) who all work together to provide you with the care you need, when you need it. You will need a follow up appointment in 4 months.  Please call our office 2 months in advance to schedule this appointment.  You may see Sinclair Grooms, MD or one of the following Advanced Practice Providers on your designated Care Team:   Truitt Merle, NP Cecilie Kicks, NP . Kathyrn Drown, NP  Any Other Special Instructions Will Be Listed Below (If Applicable).

## 2018-07-30 ENCOUNTER — Other Ambulatory Visit: Payer: Medicare Other

## 2018-11-07 ENCOUNTER — Ambulatory Visit: Payer: Medicare Other | Admitting: Interventional Cardiology

## 2018-11-07 NOTE — Progress Notes (Deleted)
Cardiology Office Note:    Date:  11/07/2018   ID:  Brian Myers, DOB [birth year], MRN [id number]  PCP:  Leighton Ruff, MD  Cardiologist:  Sinclair Grooms, MD   Referring MD: Leighton Ruff, MD   No chief complaint on file.   History of Present Illness:    Brian Myers is a 73 y.o. male with a hx of PAF,hypertension,HLD,sleep apnea,obesityand chronic anticoagulation therapy  ***  Past Medical History:  Diagnosis Date  . Arthritis    KNEES  . Asthma   . BPH (benign prostatic hypertrophy)    DIFFICULTY GETTING STREAM STARTED  . Dysrhythmia    ATRIAL FIB AND FLUTTER-DR. Linard Millers PT STATES HIS HEART IS USUALLY IN RHYTHM - BUT FOR PAST 10 DAYS OR MORE - HE HAS EXPERIENCED PALPITATIONS AND BREATHLESSNESS - HE HAS CALLED DR. Lemmie Evens. [facility] OFFICE   . Eczema    HANDS  . GERD (gastroesophageal reflux disease)   . H/O hiatal hernia   . Hyperlipidemia   . Hyperlipidemia    DOES NOT TOLERATE ANY OF THE STATINS  . Persistent atrial fibrillation    Dr Tamala Julian  . Pneumonia    LAST EPISODE JAN 2015  . Right knee meniscal tear    PAIN RT KNEE  . Shortness of breath    WHEN HEART BEAT IRREGULAR  . Sleep apnea    with cpap    Past Surgical History:  Procedure Laterality Date  . CERVICAL FUSION  1991  . KNEE ARTHROSCOPY Right 04/17/2013   Procedure: ARTHROSCOPY RIGHT KNEE WITH MEDIAL AND LATERAL DEBRIDEMENT AND CONDRAPLASTY;  Surgeon: Gearlean Alf, MD;  [facility]: [facility];  Service: Orthopedics;  Laterality: Right;  . LEFT KNEE ARTHROSCPY  2009    Current Medications: No outpatient medications have been marked as taking for the 11/07/18 encounter (Appointment) with Belva Crome, MD.     Allergies:   Penicillins, Sulfa drugs cross reactors, and Statins   Social History   Socioeconomic History  . Marital status: Married    Spouse name: Not on file  . Number of children: 2  . Years of education: Not on file  . Highest education level: Not on file   Occupational History    Employer: [employer]  Social Needs  . Financial resource strain: Not on file  . Food insecurity    Worry: Not on file    Inability: Not on file  . Transportation needs    Medical: Not on file    Non-medical: Not on file  Tobacco Use  . Smoking status: Never Smoker  . Smokeless tobacco: Never Used  Substance and Sexual Activity  . Alcohol use: Yes    Comment: at least 2-3 glasses of wine per day  . Drug use: No  . Sexual activity: Not on file  Lifestyle  . Physical activity    Days per week: Not on file    Minutes per session: Not on file  . Stress: Not on file  Relationships  . Social Herbalist on phone: Not on file    Gets together: Not on file    Attends religious service: Not on file    Active member of club or organization: Not on file    Attends meetings of clubs or organizations: Not on file    Relationship status: Not on file  Other Topics Concern  . Not on file  Social History Narrative   Lives in [place] with  spouse.  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     Family History: The patient's family history includes Arrhythmia in his mother; Hypertension in his father; Mitral valve prolapse in his mother.  ROS:   Please see the history of present illness.    *** All other systems reviewed and are negative.  EKGs/Labs/Other Studies Reviewed:    The following studies were reviewed today: ***  EKG:  EKG ***  Recent Labs: No results found for requested labs within last 8760 hours.  Recent Lipid Panel No results found for: CHOL, TRIG, HDL, CHOLHDL, VLDL, LDLCALC, LDLDIRECT  Physical Exam:    VS:  There were no vitals taken for this visit.    Wt Readings from Last 3 Encounters:  07/11/18 270 lb (122.5 kg)  01/29/18 283 lb 9.6 oz (128.6 kg)  01/08/18 277 lb 12.8 oz (126 kg)     GEN: ***. No acute distress HEENT: Normal NECK: No JVD. LYMPHATICS: No lymphadenopathy CARDIAC: *** RRR without murmur,  gallop, or edema. VASCULAR: *** Normal Pulses. No bruits. RESPIRATORY:  Clear to auscultation without rales, wheezing or rhonchi  ABDOMEN: Soft, non-tender, non-distended, No pulsatile mass, MUSCULOSKELETAL: No deformity  SKIN: Warm and dry NEUROLOGIC:  Alert and oriented x 3 PSYCHIATRIC:  Normal affect   ASSESSMENT:    1. Paroxysmal atrial fibrillation (HCC)   2. Essential hypertension   3. OSA on CPAP   4. Morbid obesity ([place])   5. Chronic anticoagulation   6. Controlled type 2 diabetes mellitus with other circulatory complication, without long-term current use of insulin (HCC)   7. Educated About Covid-19 Virus Infection    PLAN:    In order of problems listed above:  1. ***   Medication Adjustments/Labs and Tests Ordered: Current medicines are reviewed at length with the patient today.  Concerns regarding medicines are outlined above.  No orders of the defined types were placed in this encounter.  No orders of the defined types were placed in this encounter.   There are no Patient Instructions on file for this visit.   Signed, Sinclair Grooms, MD  11/07/2018 7:26 AM    [place]

## 2018-11-12 DIAGNOSIS — I10 Essential (primary) hypertension: Secondary | ICD-10-CM | POA: Diagnosis not present

## 2018-11-12 DIAGNOSIS — J45909 Unspecified asthma, uncomplicated: Secondary | ICD-10-CM | POA: Diagnosis not present

## 2018-11-12 DIAGNOSIS — D649 Anemia, unspecified: Secondary | ICD-10-CM | POA: Diagnosis not present

## 2018-11-12 DIAGNOSIS — I4891 Unspecified atrial fibrillation: Secondary | ICD-10-CM | POA: Diagnosis not present

## 2018-11-16 ENCOUNTER — Other Ambulatory Visit: Payer: Self-pay | Admitting: Interventional Cardiology

## 2018-11-16 MED ORDER — FLECAINIDE ACETATE 100 MG PO TABS: 100.0000 mg | ORAL_TABLET | Freq: Two times a day (BID) | ORAL | 2 refills | Status: DC

## 2018-12-03 ENCOUNTER — Other Ambulatory Visit: Payer: Self-pay | Admitting: Interventional Cardiology

## 2018-12-03 MED ORDER — DILTIAZEM HCL ER COATED BEADS 360 MG PO CP24: ORAL_CAPSULE | ORAL | 2 refills | Status: DC

## 2018-12-28 DIAGNOSIS — Z23 Encounter for immunization: Secondary | ICD-10-CM | POA: Diagnosis not present

## 2019-01-16 DIAGNOSIS — I10 Essential (primary) hypertension: Secondary | ICD-10-CM | POA: Diagnosis not present

## 2019-01-16 DIAGNOSIS — E7800 Pure hypercholesterolemia, unspecified: Secondary | ICD-10-CM | POA: Diagnosis not present

## 2019-01-16 DIAGNOSIS — I4891 Unspecified atrial fibrillation: Secondary | ICD-10-CM | POA: Diagnosis not present

## 2019-01-16 DIAGNOSIS — D649 Anemia, unspecified: Secondary | ICD-10-CM | POA: Diagnosis not present

## 2019-01-16 DIAGNOSIS — J45909 Unspecified asthma, uncomplicated: Secondary | ICD-10-CM | POA: Diagnosis not present

## 2019-02-04 ENCOUNTER — Other Ambulatory Visit: Payer: Self-pay | Admitting: Surgical

## 2019-02-04 DIAGNOSIS — M48061 Spinal stenosis, lumbar region without neurogenic claudication: Secondary | ICD-10-CM

## 2019-02-26 ENCOUNTER — Other Ambulatory Visit: Payer: Self-pay | Admitting: Interventional Cardiology

## 2019-02-26 NOTE — Telephone Encounter (Signed)
Prescription refill request for Pradaxa received.   OV: 01/08/2018, Vin Scr: 0.79, 02/06/2019 Weight; 122.5 kg  CrCl: 149 ml/min  Prescription refill sent.

## 2019-03-14 ENCOUNTER — Telehealth: Payer: Medicare Other | Admitting: Adult Health

## 2019-04-03 DIAGNOSIS — J45909 Unspecified asthma, uncomplicated: Secondary | ICD-10-CM | POA: Diagnosis not present

## 2019-04-03 DIAGNOSIS — I4891 Unspecified atrial fibrillation: Secondary | ICD-10-CM | POA: Diagnosis not present

## 2019-04-03 DIAGNOSIS — I10 Essential (primary) hypertension: Secondary | ICD-10-CM | POA: Diagnosis not present

## 2019-04-03 DIAGNOSIS — D649 Anemia, unspecified: Secondary | ICD-10-CM | POA: Diagnosis not present

## 2019-04-03 DIAGNOSIS — E7800 Pure hypercholesterolemia, unspecified: Secondary | ICD-10-CM | POA: Diagnosis not present

## 2019-04-03 DIAGNOSIS — N400 Enlarged prostate without lower urinary tract symptoms: Secondary | ICD-10-CM | POA: Diagnosis not present

## 2019-04-09 ENCOUNTER — Ambulatory Visit: Payer: Medicare Other | Attending: Internal Medicine

## 2019-04-09 DIAGNOSIS — Z23 Encounter for immunization: Secondary | ICD-10-CM | POA: Insufficient documentation

## 2019-04-09 NOTE — Progress Notes (Signed)
   Covid-19 Vaccination Clinic  Name:  Brian Myers    MRN: [id number] DOB: 1946/02/18  04/09/2019  [name] was observed post Covid-19 immunization for 15 minutes without incidence. He was provided with Vaccine Information Sheet and instruction to access the V-Safe system.   [name] was instructed to call 911 with any severe reactions post vaccine: Marland Kitchen Difficulty breathing  . Swelling of your face and throat  . A fast heartbeat  . A bad rash all over your body  . Dizziness and weakness    Immunizations Administered    Name Date Dose VIS Date Route   Pfizer COVID-19 Vaccine 04/09/2019 12:35 PM 0.3 mL 02/08/2019 Intramuscular   Manufacturer: [employer]   Lot: [id number]   [place]: [id number]

## 2019-04-11 ENCOUNTER — Ambulatory Visit: Payer: Medicare Other

## 2019-05-04 ENCOUNTER — Ambulatory Visit: Payer: Medicare Other | Attending: Internal Medicine

## 2019-05-04 DIAGNOSIS — Z23 Encounter for immunization: Secondary | ICD-10-CM | POA: Insufficient documentation

## 2019-05-04 NOTE — Progress Notes (Signed)
   Covid-19 Vaccination Clinic  Name:  Brian Myers    MRN: [id number] DOB: Jul 01, 1945  05/04/2019  [name] was observed post Covid-19 immunization for 15 minutes without incident. He was provided with Vaccine Information Sheet and instruction to access the V-Safe system.   [name] was instructed to call 911 with any severe reactions post vaccine: Marland Kitchen Difficulty breathing  . Swelling of face and throat  . A fast heartbeat  . A bad rash all over body  . Dizziness and weakness   Immunizations Administered    Name Date Dose VIS Date Route   Pfizer COVID-19 Vaccine 05/04/2019 10:49 AM 0.3 mL 02/08/2019 Intramuscular   Manufacturer: [place]   Lot: [id number]   [place]: [id number]

## 2019-05-05 ENCOUNTER — Encounter: Payer: Self-pay | Admitting: Adult Health

## 2019-05-07 ENCOUNTER — Telehealth (INDEPENDENT_AMBULATORY_CARE_PROVIDER_SITE_OTHER): Payer: Medicare Other | Admitting: Adult Health

## 2019-05-07 DIAGNOSIS — Z9989 Dependence on other enabling machines and devices: Secondary | ICD-10-CM

## 2019-05-07 DIAGNOSIS — G4733 Obstructive sleep apnea (adult) (pediatric): Secondary | ICD-10-CM

## 2019-05-07 NOTE — Progress Notes (Signed)
Order for ONO on CPAP sent to [facility] via community message. Confirmation received that the order transmitted was successful.

## 2019-05-07 NOTE — Progress Notes (Signed)
PATIENT: Brian Myers DOB: [birth year]  REASON FOR VISIT: follow up HISTORY FROM: patient  Virtual Visit via Video Note  I connected with [name] on 05/07/19 at  1:30 PM EST by a video enabled telemedicine application located remotely at [facility] and verified that I am speaking with the correct person using two identifiers who was located at their own home.   I discussed the limitations of evaluation and management by telemedicine and the availability of in person appointments. The patient expressed understanding and agreed to proceed.   PATIENT: Brian Myers DOB: 11/04/1945  REASON FOR VISIT: follow up HISTORY FROM: patient  HISTORY OF PRESENT ILLNESS: Today 05/07/19:  Mr. Brian Myers is a 74 year old male with a history of obstructive sleep apnea on CPAP.  His download indicates that he uses machine nightly for compliance of 100%.  He uses machine greater than 4 hours each night.  On average he uses his machine 8 hours and 28 minutes.  His residual AHI is 4.3 on 8 to 15 cm of water with EPR 3.  Leak in the 95th percentile is 1.0.  Patient reports that he has been wearing a fit bit this for management measuring his oxygen levels.  He states that over the last 6 days he reports that he has had low levels although it does not give a certain percentage.  He is concerned about this.  HISTORY 01/29/18:  Mr. Brian Myers is a 74 year old male with a history of obstructive sleep apnea on CPAP.  His CPAP download indicates that he use his machine nightly for compliance of 100%.  He uses machine greater than 4 hours every night.  On average he uses his machine 8 hours and 5 minutes.  His residual AHI is 2.8 on 8 to 15 cm of water with EPR of 3.  He does not have a significant leak.  He reports that the CPAP continues to work well for him.  He returns today for evaluation.   REVIEW OF SYSTEMS: Out of a complete 14 system review of symptoms, the patient  complains only of the following symptoms, and all other reviewed systems are negative.  ALLERGIES: Allergies  Allergen Reactions  . Penicillins Anaphylaxis  . Sulfa Drugs Cross Reactors Anaphylaxis  . Statins Other (See Comments)    INTOLERANT TO STATINS - SEVERE MUSCLE CRAMPS    HOME MEDICATIONS: Outpatient Medications Prior to Visit  Medication Sig Dispense Refill  . albuterol (PROVENTIL HFA;VENTOLIN HFA) 108 (90 BASE) MCG/ACT inhaler Inhale 2 puffs into the lungs every 6 (six) hours as needed for wheezing or shortness of breath.     . budesonide (PULMICORT) 180 MCG/ACT inhaler Inhale 1 puff into the lungs 2 (two) times daily.     . dabigatran (PRADAXA) 150 MG CAPS capsule TAKE 1 CAPSULE BY MOUTH EVERY 12 HOURS 180 capsule 1  . diltiazem (CARDIZEM CD) 360 MG 24 hr capsule TAKE 1 CAPSULE BY MOUTH EVERY DAY IN THE EVENING 90 capsule 2  . esomeprazole (NEXIUM) 40 MG capsule Take 40 mg by mouth daily.  1  . flecainide (TAMBOCOR) 100 MG tablet Take 1 tablet (100 mg total) by mouth 2 (two) times daily. 180 tablet 2  . fluticasone (FLONASE) 50 MCG/ACT nasal spray Place 1 spray into both nostrils 2 (two) times a day.     . losartan (COZAAR) 50 MG tablet Take 1 tablet (50 mg total) by mouth daily. 90 tablet 3  . methylcellulose (ARTIFICIAL TEARS) 1 %  ophthalmic solution Place 1 drop into both eyes 2 (two) times daily as needed (dry eyes).    . salmeterol (SEREVENT) 50 MCG/DOSE diskus inhaler Inhale 1 puff into the lungs 2 (two) times daily.      No facility-administered medications prior to visit.    PAST MEDICAL HISTORY: Past Medical History:  Diagnosis Date  . Arthritis    KNEES  . Asthma   . BPH (benign prostatic hypertrophy)    DIFFICULTY GETTING STREAM STARTED  . Dysrhythmia    ATRIAL FIB AND FLUTTER-DR. Linard Millers PT STATES HIS HEART IS USUALLY IN RHYTHM - BUT FOR PAST 10 DAYS OR MORE - HE HAS EXPERIENCED PALPITATIONS AND BREATHLESSNESS - HE HAS CALLED DR. Lemmie Evens. [facility] OFFICE   .  Eczema    HANDS  . GERD (gastroesophageal reflux disease)   . H/O hiatal hernia   . Hyperlipidemia   . Hyperlipidemia    DOES NOT TOLERATE ANY OF THE STATINS  . Persistent atrial fibrillation    Dr Tamala Julian  . Pneumonia    LAST EPISODE JAN 2015  . Right knee meniscal tear    PAIN RT KNEE  . Shortness of breath    WHEN HEART BEAT IRREGULAR  . Sleep apnea    with cpap    PAST SURGICAL HISTORY: Past Surgical History:  Procedure Laterality Date  . CERVICAL FUSION  1991  . KNEE ARTHROSCOPY Right 04/17/2013   Procedure: ARTHROSCOPY RIGHT KNEE WITH MEDIAL AND LATERAL DEBRIDEMENT AND CONDRAPLASTY;  Surgeon: Gearlean Alf, MD;  Location: [facility];  Service: Orthopedics;  Laterality: Right;  . LEFT KNEE ARTHROSCPY  2009    FAMILY HISTORY: Family History  Problem Relation Age of Onset  . Arrhythmia Mother        atrial fib  . Mitral valve prolapse Mother   . Hypertension Father     SOCIAL HISTORY: Social History   Socioeconomic History  . Marital status: Married    Spouse name: Not on file  . Number of children: 2  . Years of education: Not on file  . Highest education level: Not on file  Occupational History    Employer: [employer]  Tobacco Use  . Smoking status: Never Smoker  . Smokeless tobacco: Never Used  Substance and Sexual Activity  . Alcohol use: Yes    Comment: at least 2-3 glasses of wine per day  . Drug use: No  . Sexual activity: Not on file  Other Topics Concern  . Not on file  Social History Narrative   Lives in [place] with spouse.  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   Social Determinants of Health   Financial Resource Strain:   . Difficulty of Paying Living Expenses: Not on file  Food Insecurity:   . Worried About Charity fundraiser in the Last Year: Not on file  . Ran Out of Food in the Last Year: Not on file  Transportation Needs:   . Lack of Transportation (Medical): Not on file  . Lack of Transportation (Non-Medical): Not  on file  Physical Activity:   . Days of Exercise per Week: Not on file  . Minutes of Exercise per Session: Not on file  Stress:   . Feeling of Stress : Not on file  Social Connections:   . Frequency of Communication with Friends and Family: Not on file  . Frequency of Social Gatherings with Friends and Family: Not on file  . Attends Religious Services: Not on  file  . Active Member of Clubs or Organizations: Not on file  . Attends Archivist Meetings: Not on file  . Marital Status: Not on file  Intimate Partner Violence:   . Fear of Current or Ex-Partner: Not on file  . Emotionally Abused: Not on file  . Physically Abused: Not on file  . Sexually Abused: Not on file      PHYSICAL EXAM Generalized: Well developed, in no acute distress   Neurological examination  Mentation: Alert oriented to time, place, history taking. Follows all commands speech and language fluent Cranial nerve II-XII:Extraocular movements were full. Facial symmetry noted. uvula tongue midline. Head turning and shoulder shrug  were normal and symmetric. Motor: Good strength throughout subjectively per patient Sensory: Sensory testing is intact to soft touch on all 4 extremities subjectively per patient Coordination: Cerebellar testing reveals good finger-nose-finger  Gait and station: Patient is able to stand from a seated position. gait is normal.  Reflexes: UTA  DIAGNOSTIC DATA (LABS, IMAGING, TESTING) - I reviewed patient records, labs, notes, testing and imaging myself where available.  Lab Results  Component Value Date   WBC 8.3 04/12/2013   HGB 14.0 11/27/2015   HCT 35.9 (L) 06/07/2013   MCV 83.8 04/12/2013   PLT 297 04/12/2013      Component Value Date/Time   NA 141 11/27/2015 1442   K 3.5 11/27/2015 1442   CL 104 11/27/2015 1442   CO2 22 11/27/2015 1442   GLUCOSE 98 11/27/2015 1442   BUN 13 11/27/2015 1442   CREATININE 0.98 11/27/2015 1442   CALCIUM 8.7 11/27/2015 1442    ALBUMIN 3.5 06/07/2013 1340   GFRNONAA 85 (L) 04/12/2013 1500   GFRAA >90 04/12/2013 1500      ASSESSMENT AND PLAN 74 y.o. year old male  has a past medical history of Arthritis, Asthma, BPH (benign prostatic hypertrophy), Dysrhythmia, Eczema, GERD (gastroesophageal reflux disease), H/O hiatal hernia, Hyperlipidemia, Hyperlipidemia, Persistent atrial fibrillation, Pneumonia, Right knee meniscal tear, Shortness of breath, and Sleep apnea. here with :  1.  Obstructive sleep apnea on CPAP  -Good compliance and treatment of his apnea -Encouraged to use CPAP nightly and greater than 4 hours each night -Overnight pulse oximetry ordered -Follow-up in 1 year or sooner if needed   I spent 15 minutes of face-to-face and non-face-to-face time with patient.  This included previsit chart review,  study review, order entry, electronic health record documentation, patient education.    Ward Givens, MSN, NP-C 05/07/2019, 1:24 PM [facility] [address] [place], [place] [address] [phone]

## 2019-05-23 DIAGNOSIS — R0902 Hypoxemia: Secondary | ICD-10-CM | POA: Diagnosis not present

## 2019-05-23 DIAGNOSIS — J449 Chronic obstructive pulmonary disease, unspecified: Secondary | ICD-10-CM | POA: Diagnosis not present

## 2019-05-29 ENCOUNTER — Telehealth: Payer: Self-pay | Admitting: Adult Health

## 2019-05-29 DIAGNOSIS — G4734 Idiopathic sleep related nonobstructive alveolar hypoventilation: Secondary | ICD-10-CM

## 2019-05-29 NOTE — Telephone Encounter (Signed)
LMVM for pt to return call tomorrow.

## 2019-05-29 NOTE — Telephone Encounter (Signed)
Patient's overnight pulse oximetry showed a baseline O2 level 87%.  His O2 sat was less than 88% 4 hours and 59 minutes.  I discussed with Dr. Rexene Alberts.  I will order a split-night study to verify these results. Please call patient

## 2019-05-30 ENCOUNTER — Other Ambulatory Visit: Payer: Self-pay | Admitting: Neurology

## 2019-05-30 DIAGNOSIS — G4734 Idiopathic sleep related nonobstructive alveolar hypoventilation: Secondary | ICD-10-CM

## 2019-05-30 DIAGNOSIS — G4733 Obstructive sleep apnea (adult) (pediatric): Secondary | ICD-10-CM

## 2019-05-30 DIAGNOSIS — Z9989 Dependence on other enabling machines and devices: Secondary | ICD-10-CM

## 2019-05-30 NOTE — Telephone Encounter (Signed)
I called pt and relayed the results of the pulse oximetry testing as noted.  Answered questions.  He would like to either have virtual appt to speak with provider prior to ordering testing.  Did you want to call or set up virtual?

## 2019-05-30 NOTE — Telephone Encounter (Signed)
Ok to setup [employer] or telephone

## 2019-05-30 NOTE — Telephone Encounter (Signed)
I called pt and LMVM for him that made virtual phone visit for him 06-11-19 at 1500 with MM/NP to discuss pulse ox results.  He is to call back if does not work and reschedule.

## 2019-06-05 ENCOUNTER — Telehealth: Payer: Self-pay

## 2019-06-05 NOTE — Telephone Encounter (Signed)
Patient is not interested in scheduling sleep study at this time. He is wanting to talk with Hedwig Morton first about the results of ONO. After that visit, he may considering scheduling sleep study.

## 2019-06-11 ENCOUNTER — Telehealth: Payer: Self-pay | Admitting: Adult Health

## 2019-06-12 ENCOUNTER — Telehealth: Payer: Self-pay

## 2019-06-12 DIAGNOSIS — I10 Essential (primary) hypertension: Secondary | ICD-10-CM | POA: Diagnosis not present

## 2019-06-12 DIAGNOSIS — J45909 Unspecified asthma, uncomplicated: Secondary | ICD-10-CM | POA: Diagnosis not present

## 2019-06-12 DIAGNOSIS — I4891 Unspecified atrial fibrillation: Secondary | ICD-10-CM | POA: Diagnosis not present

## 2019-06-12 DIAGNOSIS — D649 Anemia, unspecified: Secondary | ICD-10-CM | POA: Diagnosis not present

## 2019-06-12 DIAGNOSIS — E7800 Pure hypercholesterolemia, unspecified: Secondary | ICD-10-CM | POA: Diagnosis not present

## 2019-06-12 DIAGNOSIS — N400 Enlarged prostate without lower urinary tract symptoms: Secondary | ICD-10-CM | POA: Diagnosis not present

## 2019-06-12 NOTE — Telephone Encounter (Signed)
Pt is not interested in scheduling CPAP study at this time. Pt was to have a virtual visit with Hedwig Morton about ONO results and he wanted to discuss CPAP study at that time with her. Pt will call sleep lab back when he is ready to schedule.

## 2019-06-25 ENCOUNTER — Telehealth: Payer: Self-pay | Admitting: Adult Health

## 2019-06-25 NOTE — Telephone Encounter (Signed)
Pt called wanting to r/s his missed VV. Pt did not want to wait for the next appt that was available that was offered to him. He is wanting to be told his Oxygen results sooner than that appt. Please advise.

## 2019-06-27 NOTE — Telephone Encounter (Signed)
I have an appointment open Monday at 8:00.  See if he can take this appointment for a virtual visit or telephone visit

## 2019-06-27 NOTE — Telephone Encounter (Signed)
I spoke to pt and he said he did check in on last appt and call mychart help (they said it was on our end).  I did make mychart vv and for 07-01-19 at 0800. Explained that he will need to check in and video icon will turn green for [place] to know he has checked in.  If all else fails call mobile line.

## 2019-07-01 ENCOUNTER — Telehealth (INDEPENDENT_AMBULATORY_CARE_PROVIDER_SITE_OTHER): Payer: Medicare Other | Admitting: Adult Health

## 2019-07-01 ENCOUNTER — Telehealth: Payer: Self-pay | Admitting: Neurology

## 2019-07-01 DIAGNOSIS — G4734 Idiopathic sleep related nonobstructive alveolar hypoventilation: Secondary | ICD-10-CM

## 2019-07-01 DIAGNOSIS — Z9989 Dependence on other enabling machines and devices: Secondary | ICD-10-CM

## 2019-07-01 DIAGNOSIS — G4733 Obstructive sleep apnea (adult) (pediatric): Secondary | ICD-10-CM | POA: Diagnosis not present

## 2019-07-01 NOTE — Progress Notes (Signed)
PATIENT: Brian Myers DOB: 1945-11-16  REASON FOR VISIT: follow up HISTORY FROM: patient  Virtual Visit via Video Note  I connected with Brian Myers on 07/01/19 at  8:00 AM EDT by a video enabled telemedicine application located remotely at [facility] and verified that I am speaking with the correct person using two identifiers who was located at their own home.   I discussed the limitations of evaluation and management by telemedicine and the availability of in person appointments. The patient expressed understanding and agreed to proceed.   PATIENT: Brian Myers DOB: 11-15-1945  REASON FOR VISIT: follow up HISTORY FROM: patient  HISTORY OF PRESENT ILLNESS: Today 07/01/19:  Brian Myers is a 74 year old male with a history of obstructive sleep apnea on CPAP.  He joins me today for a virtual visit.  The patient wanted to review his overnight pulse oximetry in more detail before scheduling CPAP titration.  I advised that his overnight pulse oximetry showed that he was less than 88% for approximately 4 hours.  This would qualify him for nocturnal oxygen but this should be verified with a CPAP titration.  Patient voiced understanding.  He is ready to schedule CPAP titration     REVIEW OF SYSTEMS: Out of a complete 14 system review of symptoms, the patient complains only of the following symptoms, and all other reviewed systems are negative.  ALLERGIES: Allergies  Allergen Reactions  . Penicillins Anaphylaxis  . Sulfa Drugs Cross Reactors Anaphylaxis  . Statins Other (See Comments)    INTOLERANT TO STATINS - SEVERE MUSCLE CRAMPS    HOME MEDICATIONS: Outpatient Medications Prior to Visit  Medication Sig Dispense Refill  . albuterol (PROVENTIL HFA;VENTOLIN HFA) 108 (90 BASE) MCG/ACT inhaler Inhale 2 puffs into the lungs every 6 (six) hours as needed for wheezing or shortness of breath.     . budesonide (PULMICORT) 180 MCG/ACT inhaler Inhale  1 puff into the lungs 2 (two) times daily.     . dabigatran (PRADAXA) 150 MG CAPS capsule TAKE 1 CAPSULE BY MOUTH EVERY 12 HOURS 180 capsule 1  . diltiazem (CARDIZEM CD) 360 MG 24 hr capsule TAKE 1 CAPSULE BY MOUTH EVERY DAY IN THE EVENING 90 capsule 2  . esomeprazole (NEXIUM) 40 MG capsule Take 40 mg by mouth daily.  1  . flecainide (TAMBOCOR) 100 MG tablet Take 1 tablet (100 mg total) by mouth 2 (two) times daily. 180 tablet 2  . fluticasone (FLONASE) 50 MCG/ACT nasal spray Place 1 spray into both nostrils 2 (two) times a day.     . losartan (COZAAR) 50 MG tablet Take 1 tablet (50 mg total) by mouth daily. 90 tablet 3  . methylcellulose (ARTIFICIAL TEARS) 1 % ophthalmic solution Place 1 drop into both eyes 2 (two) times daily as needed (dry eyes).    . salmeterol (SEREVENT) 50 MCG/DOSE diskus inhaler Inhale 1 puff into the lungs 2 (two) times daily.      No facility-administered medications prior to visit.    PAST MEDICAL HISTORY: Past Medical History:  Diagnosis Date  . Arthritis    KNEES  . Asthma   . BPH (benign prostatic hypertrophy)    DIFFICULTY GETTING STREAM STARTED  . Dysrhythmia    ATRIAL FIB AND FLUTTER-DR. Linard Millers PT STATES HIS HEART IS USUALLY IN RHYTHM - BUT FOR PAST 10 DAYS OR MORE - HE HAS EXPERIENCED PALPITATIONS AND BREATHLESSNESS - HE HAS CALLED DR. Lemmie Evens. [facility] OFFICE   . Eczema  HANDS  . GERD (gastroesophageal reflux disease)   . H/O hiatal hernia   . Hyperlipidemia   . Hyperlipidemia    DOES NOT TOLERATE ANY OF THE STATINS  . Persistent atrial fibrillation    Dr Tamala Julian  . Pneumonia    LAST EPISODE JAN 2015  . Right knee meniscal tear    PAIN RT KNEE  . Shortness of breath    WHEN HEART BEAT IRREGULAR  . Sleep apnea    with cpap    PAST SURGICAL HISTORY: Past Surgical History:  Procedure Laterality Date  . CERVICAL FUSION  1991  . KNEE ARTHROSCOPY Right 04/17/2013   Procedure: ARTHROSCOPY RIGHT KNEE WITH MEDIAL AND LATERAL DEBRIDEMENT AND  CONDRAPLASTY;  Surgeon: Gearlean Alf, MD;  Location: [facility];  Service: Orthopedics;  Laterality: Right;  . LEFT KNEE ARTHROSCPY  2009    FAMILY HISTORY: Family History  Problem Relation Age of Onset  . Arrhythmia Mother        atrial fib  . Mitral valve prolapse Mother   . Hypertension Father     SOCIAL HISTORY: Social History   Socioeconomic History  . Marital status: Married    Spouse name: Not on file  . Number of children: 2  . Years of education: Not on file  . Highest education level: Not on file  Occupational History    Employer: [employer]  Tobacco Use  . Smoking status: Never Smoker  . Smokeless tobacco: Never Used  Substance and Sexual Activity  . Alcohol use: Yes    Comment: at least 2-3 glasses of wine per day  . Drug use: No  . Sexual activity: Not on file  Other Topics Concern  . Not on file  Social History Narrative   Lives in [place] with spouse.  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   Social Determinants of Health   Financial Resource Strain:   . Difficulty of Paying Living Expenses:   Food Insecurity:   . Worried About Charity fundraiser in the Last Year:   . Arboriculturist in the Last Year:   Transportation Needs:   . Film/video editor (Medical):   Marland Kitchen Lack of Transportation (Non-Medical):   Physical Activity:   . Days of Exercise per Week:   . Minutes of Exercise per Session:   Stress:   . Feeling of Stress :   Social Connections:   . Frequency of Communication with Friends and Family:   . Frequency of Social Gatherings with Friends and Family:   . Attends Religious Services:   . Active Member of Clubs or Organizations:   . Attends Archivist Meetings:   Marland Kitchen Marital Status:   Intimate Partner Violence:   . Fear of Current or Ex-Partner:   . Emotionally Abused:   Marland Kitchen Physically Abused:   . Sexually Abused:       PHYSICAL EXAM Generalized: Well developed, in no acute distress   Neurological  examination  Mentation: Alert oriented to time, place, history taking. Follows all commands speech and language fluent Cranial nerve II-XII: Facial symmetry noted.   DIAGNOSTIC DATA (LABS, IMAGING, TESTING) - I reviewed patient records, labs, notes, testing and imaging myself where available.  Lab Results  Component Value Date   WBC 8.3 04/12/2013   HGB 14.0 11/27/2015   HCT 35.9 (L) 06/07/2013   MCV 83.8 04/12/2013   PLT 297 04/12/2013      Component Value Date/Time   NA  141 11/27/2015 1442   K 3.5 11/27/2015 1442   CL 104 11/27/2015 1442   CO2 22 11/27/2015 1442   GLUCOSE 98 11/27/2015 1442   BUN 13 11/27/2015 1442   CREATININE 0.98 11/27/2015 1442   CALCIUM 8.7 11/27/2015 1442   ALBUMIN 3.5 06/07/2013 1340   GFRNONAA 85 (L) 04/12/2013 1500   GFRAA >90 04/12/2013 1500      ASSESSMENT AND PLAN 74 y.o. year old male  has a past medical history of Arthritis, Asthma, BPH (benign prostatic hypertrophy), Dysrhythmia, Eczema, GERD (gastroesophageal reflux disease), H/O hiatal hernia, Hyperlipidemia, Hyperlipidemia, Persistent atrial fibrillation, Pneumonia, Right knee meniscal tear, Shortness of breath, and Sleep apnea. here with:  1.  Obstructive sleep apnea on CPAP  -CPAP titration ordered to verify ONO results -Patient is ready to schedule-sleep lab was notified  I spent 20 minutes of face-to-face and non-face-to-face time with patient.  This included previsit chart review, lab review, study review, order entry, electronic health record documentation, patient education.    Ward Givens, MSN, NP-C 07/01/2019, 8:18 AM [facility] [address] [place], [place] [address] [phone]

## 2019-07-01 NOTE — Telephone Encounter (Signed)
LVM for pt to call me back to schedule cpap study.

## 2019-07-03 NOTE — Progress Notes (Deleted)
CARDIOLOGY OFFICE NOTE  Date:  07/08/2019    Brian Myers Date of Birth: 1945-08-08 Medical Record [id number]  PCP:  Leighton Ruff, MD  Cardiologist:  Tamala Julian & [facility]   No chief complaint on file.   History of Present Illness: Brian Myers is a 74 y.o. male who presents today for a one year check. Seen for Dr. Tamala Julian and Dr. Rayann Heman.   He has a history of PAF,hypertension,HLD,sleep apnea,obesityand is on chronic anticoagulation therapy.  Last seen by Dr. Tamala Julian a year ago by a telehealth visit - had gained weight with the pandemic. No significant episodes of atrial fibrillation noted.   The patient {does/does not:200015} have symptoms concerning for COVID-19 infection (fever, chills, cough, or new shortness of breath).   Comes in today. Here with   Past Medical History:  Diagnosis Date  . Arthritis    KNEES  . Asthma   . BPH (benign prostatic hypertrophy)    DIFFICULTY GETTING STREAM STARTED  . Dysrhythmia    ATRIAL FIB AND FLUTTER-DR. Linard Millers PT STATES HIS HEART IS USUALLY IN RHYTHM - BUT FOR PAST 10 DAYS OR MORE - HE HAS EXPERIENCED PALPITATIONS AND BREATHLESSNESS - HE HAS CALLED DR. Lemmie Evens. [facility]   . Eczema    HANDS  . GERD (gastroesophageal reflux disease)   . H/O hiatal hernia   . Hyperlipidemia   . Hyperlipidemia    DOES NOT TOLERATE ANY OF THE STATINS  . Persistent atrial fibrillation    Dr Tamala Julian  . Pneumonia    LAST EPISODE JAN 2015  . Right knee meniscal tear    PAIN RT KNEE  . Shortness of breath    WHEN HEART BEAT IRREGULAR  . Sleep apnea    with cpap    Past Surgical History:  Procedure Laterality Date  . CERVICAL FUSION  1991  . KNEE ARTHROSCOPY Right 04/17/2013   Procedure: ARTHROSCOPY RIGHT KNEE WITH MEDIAL AND LATERAL DEBRIDEMENT AND CONDRAPLASTY;  Surgeon: Gearlean Alf, MD;  Location: [facility];  Service: Orthopedics;  Laterality: Right;  . LEFT KNEE ARTHROSCPY  2009     Medications: No outpatient medications  have been marked as taking for the 07/09/19 encounter (Appointment) with Burtis Junes, NP.     Allergies: Allergies  Allergen Reactions  . Penicillins Anaphylaxis  . Sulfa Drugs Cross Reactors Anaphylaxis  . Statins Other (See Comments)    INTOLERANT TO STATINS - SEVERE MUSCLE CRAMPS    Social History: The patient  reports that he has never smoked. He has never used smokeless tobacco. He reports current alcohol use. He reports that he does not use drugs.   Family History: The patient's ***family history includes Arrhythmia in his mother; Hypertension in his father; Mitral valve prolapse in his mother.   Review of Systems: Please see the history of present illness.   All other systems are reviewed and negative.   Physical Exam: VS:  There were no vitals taken for this visit. Marland Kitchen  BMI There is no height or weight on file to calculate BMI.  Wt Readings from Last 3 Encounters:  07/11/18 270 lb (122.5 kg)  01/29/18 283 lb 9.6 oz (128.6 kg)  01/08/18 277 lb 12.8 oz (126 kg)    General: Pleasant. Well developed, well nourished and in no acute distress.   HEENT: Normal.  Neck: Supple, no JVD, carotid bruits, or masses noted.  Cardiac: ***Regular rate and rhythm. No murmurs, rubs, or gallops. No edema.  Respiratory:  Lungs are clear to auscultation bilaterally with normal work of breathing.  GI: Soft and nontender.  MS: No deformity or atrophy. Gait and ROM intact.  Skin: Warm and dry. Color is normal.  Neuro:  Strength and sensation are intact and no gross focal deficits noted.  Psych: Alert, appropriate and with normal affect.   LABORATORY DATA:  EKG:  EKG {ACTION; IS/IS [id number] ordered today.  Personally reviewed by me. This demonstrates ***.  Lab Results  Component Value Date   WBC 8.3 04/12/2013   HGB 14.0 11/27/2015   HCT 35.9 (L) 06/07/2013   PLT 297 04/12/2013   GLUCOSE 98 11/27/2015   NA 141 11/27/2015   K 3.5 11/27/2015   CL 104 11/27/2015   CREATININE  0.98 11/27/2015   BUN 13 11/27/2015   CO2 22 11/27/2015   INR 1.04 04/12/2013     BNP (last 3 results) No results for input(s): BNP in the last 8760 hours.  ProBNP (last 3 results) No results for input(s): PROBNP in the last 8760 hours.   Other Studies Reviewed Today:  ECHO Study Conclusions 2018  - Left ventricle: The cavity size was normal. Wall thickness was  normal. Systolic function was normal. The estimated ejection  fraction was in the range of 60% to 65%. Wall motion was normal;  there were no regional wall motion abnormalities. Features are  consistent with a pseudonormal left ventricular filling pattern,  with concomitant abnormal relaxation and increased filling  pressure (grade 2 diastolic dysfunction).  - Right atrium: The atrium was mildly dilated.     ASSESSMENT & PLAN:    1. PAF  2. HTN  3. OSA - on CPAP  4. Morbid obesity  5. Chronic anticoagulation -   1. 6. DM - Reasonable symptomatic control on flecainide.  No change in therapy recommended at this time. 2. Poorly controlled blood pressure especially given diabetes mellitus and chronic anticoagulation.  Start losartan 50 mg/day. 3. Compliance with CPAP is advocated. 4. Encouraged weight loss by undertaking moderate aerobic activity and calorie reduction. 5. Continue Pradaxa.  Watch for bleeding. 6. Elevated hemoglobin A1c's are consistent with type 2 diabetes.  Tighter blood pressure control is required.  Renal protection is required.  Start losartan 50 mg/day.    Marland Kitchen COVID-19 Education: The signs and symptoms of COVID-19 were discussed with the patient and how to seek care for testing (follow up with PCP or arrange E-visit).  The importance of social distancing, staying at home, hand hygiene and wearing a mask when out in public were discussed today.  Current medicines are reviewed with the patient today.  The patient does not have concerns regarding medicines other than what has  been noted above.  The following changes have been made:  See above.  Labs/ tests ordered today include:   No orders of the defined types were placed in this encounter.    Disposition:   FU with *** in {gen number [id number] {Days to years:10300}.   Patient is agreeable to this plan and will call if any problems develop in the interim.   SignedTruitt Merle, NP  07/08/2019 8:42 AM  [facility] [address] [place], [place]  [address] Phone: [phone] Fax: [phone]

## 2019-07-09 ENCOUNTER — Ambulatory Visit: Payer: Medicare Other | Admitting: Nurse Practitioner

## 2019-07-24 NOTE — Progress Notes (Signed)
CARDIOLOGY OFFICE NOTE  Date:  07/30/2019    Brian Myers Date of Birth: 1945-07-03 Medical Record [id number]  PCP:  Leighton Ruff, MD  Cardiologist:  Tamala Julian   Chief Complaint  Patient presents with  . Follow-up    Seen for Dr. Tamala Julian    History of Present Illness: Brian Myers is a 74 y.o. male who presents today for a follow up visit. Seen for Dr. Tamala Julian.   He has a history of PAF,hypertension,HLD,DM, sleep apnea,obesityand chronic anticoagulation therapy with Pradaxa.  Last seen for a virtual visit about a year ago by Dr. Tamala Julian - had gained weight. BP was up - Losartan was added to his regimen given his history of DM. Otherwise felt to be doing ok.   The patient does not have symptoms concerning for COVID-19 infection (fever, chills, cough, or new shortness of breath).   Comes in today. Here alone. He feels like he has done ok. He has noted more spells of getting a "nervous/weak" feeling - progressive since October - not really a dizziness and has had no passing out. About 4 weeks ago he started taking his CCB at night - this has helped him considerably but he wishes to be better. Now starting to feel like getting out and walking. No chest pain. Occasionally will check his BP. No problems with his Pradaxa. He says he and Dr. Drema Dallas are working on his lipids and A1C - lipids are not at goal. He has an Alive Cor - he brings in a tracing that he caught showing bradycardia - HR was 45.   Past Medical History:  Diagnosis Date  . Arthritis    KNEES  . Asthma   . BPH (benign prostatic hypertrophy)    DIFFICULTY GETTING STREAM STARTED  . Dysrhythmia    ATRIAL FIB AND FLUTTER-DR. Linard Millers PT STATES HIS HEART IS USUALLY IN RHYTHM - BUT FOR PAST 10 DAYS OR MORE - HE HAS EXPERIENCED PALPITATIONS AND BREATHLESSNESS - HE HAS CALLED DR. Lemmie Evens. [facility]   . Eczema    HANDS  . GERD (gastroesophageal reflux disease)   . H/O hiatal hernia   . Hyperlipidemia   .  Hyperlipidemia    DOES NOT TOLERATE ANY OF THE STATINS  . Persistent atrial fibrillation (HCC)    Dr Tamala Julian  . Pneumonia    LAST EPISODE JAN 2015  . Right knee meniscal tear    PAIN RT KNEE  . Shortness of breath    WHEN HEART BEAT IRREGULAR  . Sleep apnea    with cpap    Past Surgical History:  Procedure Laterality Date  . CERVICAL FUSION  1991  . KNEE ARTHROSCOPY Right 04/17/2013   Procedure: ARTHROSCOPY RIGHT KNEE WITH MEDIAL AND LATERAL DEBRIDEMENT AND CONDRAPLASTY;  Surgeon: Gearlean Alf, MD;  Location: [facility];  Service: Orthopedics;  Laterality: Right;  . LEFT KNEE ARTHROSCPY  2009     Medications: Current Meds  Medication Sig  . albuterol (PROVENTIL HFA;VENTOLIN HFA) 108 (90 BASE) MCG/ACT inhaler Inhale 2 puffs into the lungs every 6 (six) hours as needed for wheezing or shortness of breath.   . dabigatran (PRADAXA) 150 MG CAPS capsule TAKE 1 CAPSULE BY MOUTH EVERY 12 HOURS  . esomeprazole (NEXIUM) 40 MG capsule Take 40 mg by mouth daily.  . flecainide (TAMBOCOR) 100 MG tablet Take 1 tablet (100 mg total) by mouth 2 (two) times daily.  . fluticasone (FLONASE) 50 MCG/ACT nasal spray Place 1 spray  into both nostrils 2 (two) times a day.   . losartan (COZAAR) 50 MG tablet Take 1 tablet (50 mg total) by mouth daily.  . methylcellulose (ARTIFICIAL TEARS) 1 % ophthalmic solution Place 1 drop into both eyes 2 (two) times daily as needed (dry eyes).  . SYMBICORT 160-4.5 MCG/ACT inhaler SMARTSIG:2 Puff(s) By Mouth Twice Daily  . [DISCONTINUED] diltiazem (CARDIZEM CD) 360 MG 24 hr capsule TAKE 1 CAPSULE BY MOUTH EVERY DAY IN THE EVENING     Allergies: Allergies  Allergen Reactions  . Penicillins Anaphylaxis  . Sulfa Drugs Cross Reactors Anaphylaxis  . Statins Other (See Comments)    INTOLERANT TO STATINS - SEVERE MUSCLE CRAMPS    Social History: The patient  reports that he has never smoked. He has never used smokeless tobacco. He reports current alcohol use. He reports  that he does not use drugs.   Family History: The patient's family history includes Arrhythmia in his mother; Hypertension in his father; Mitral valve prolapse in his mother.   Review of Systems: Please see the history of present illness.   All other systems are reviewed and negative.   Physical Exam: VS:  BP 130/82   Pulse 68   Ht 5\' 11"  (1.803 m)   Wt 280 lb 6.4 oz (127.2 kg)   SpO2 96%   BMI 39.11 kg/m  .  BMI Body mass index is 39.11 kg/m.  Wt Readings from Last 3 Encounters:  07/30/19 280 lb 6.4 oz (127.2 kg)  07/11/18 270 lb (122.5 kg)  01/29/18 283 lb 9.6 oz (128.6 kg)    General: Pleasant. Morbidly obese. Alert and in no acute distress.   Cardiac: Regular rate and rhythm. No murmurs, rubs, or gallops. No edema.  Respiratory:  Lungs are clear to auscultation bilaterally with normal work of breathing.  GI: Soft and nontender.  MS: No deformity or atrophy. Gait and ROM intact.  Skin: Warm and dry. Color is normal.  Neuro:  Strength and sensation are intact and no gross focal deficits noted.  Psych: Alert, appropriate and with normal affect.   LABORATORY DATA:  EKG:  EKG is ordered today.  Personally reviewed by me. This demonstrates NSR with 1st degree AV block - HR is 68 - non specific changes. This is unchanged.   Lab Results  Component Value Date   WBC 8.3 04/12/2013   HGB 14.0 11/27/2015   HCT 35.9 (L) 06/07/2013   PLT 297 04/12/2013   GLUCOSE 98 11/27/2015   NA 141 11/27/2015   K 3.5 11/27/2015   CL 104 11/27/2015   CREATININE 0.98 11/27/2015   BUN 13 11/27/2015   CO2 22 11/27/2015   INR 1.04 04/12/2013       BNP (last 3 results) No results for input(s): BNP in the last 8760 hours.  ProBNP (last 3 results) No results for input(s): PROBNP in the last 8760 hours.   Other Studies Reviewed Today:  Echo Study Conclusions July 2018  - Left ventricle: The cavity size was normal. Wall thickness was  normal. Systolic function was normal. The  estimated ejection  fraction was in the range of 60% to 65%. Wall motion was normal;  there were no regional wall motion abnormalities. Features are  consistent with a pseudonormal left ventricular filling pattern,  with concomitant abnormal relaxation and increased filling  pressure (grade 2 diastolic dysfunction).  - Right atrium: The atrium was mildly dilated.   ASSESSMENT & PLAN:    1. Probable symptomatic bradycardia - will  cut the Diltiazem back to 240 mg a day. May need event monitor. Cautioned that he may experience higher heart rates.   2. HTN - BP is ok here today.  3. OSA - on CPAP - not discussed.   4. Morbid obesity - has not made much progress - says he is now feeling some better - actually walked earlier today.   5. Chronic anticoagulation - no problems noted. Labs by PCP.   6. PAF - on Tambocor - in sinus today. 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   7. COVID-19 Education: The signs and symptoms of COVID-19 were discussed with the patient and how to seek care for testing (follow up with PCP or arrange E-visit).  The importance of social distancing, staying at home, hand hygiene and wearing a mask when out in public were discussed today.  Current medicines are reviewed with the patient today.  The patient does not have concerns regarding medicines other than what has been noted above.  The following changes have been made:  See above.  Labs/ tests ordered today include:    Orders Placed This Encounter  Procedures  . EKG 12-Lead     Disposition:   FU with [place] in about 3 months with EKG. He is to call us if his symptoms worsen/change -may need event monitor.      Patient is agreeable to this plan and will call if any problems develop in the interim.   SignedTruitt Merle, NP  07/30/2019 4:27 PM  [facility] [address] [place], [place]  [address] Phone: [phone] Fax: [phone]

## 2019-07-25 DIAGNOSIS — I4891 Unspecified atrial fibrillation: Secondary | ICD-10-CM | POA: Diagnosis not present

## 2019-07-25 DIAGNOSIS — E7800 Pure hypercholesterolemia, unspecified: Secondary | ICD-10-CM | POA: Diagnosis not present

## 2019-07-25 DIAGNOSIS — J45909 Unspecified asthma, uncomplicated: Secondary | ICD-10-CM | POA: Diagnosis not present

## 2019-07-25 DIAGNOSIS — D649 Anemia, unspecified: Secondary | ICD-10-CM | POA: Diagnosis not present

## 2019-07-25 DIAGNOSIS — I10 Essential (primary) hypertension: Secondary | ICD-10-CM | POA: Diagnosis not present

## 2019-07-25 DIAGNOSIS — N400 Enlarged prostate without lower urinary tract symptoms: Secondary | ICD-10-CM | POA: Diagnosis not present

## 2019-07-30 ENCOUNTER — Other Ambulatory Visit: Payer: Self-pay

## 2019-07-30 ENCOUNTER — Encounter: Payer: Self-pay | Admitting: Nurse Practitioner

## 2019-07-30 ENCOUNTER — Ambulatory Visit (INDEPENDENT_AMBULATORY_CARE_PROVIDER_SITE_OTHER): Payer: Medicare Other | Admitting: Nurse Practitioner

## 2019-07-30 VITALS — BP 130/82 | HR 68 | Ht 71.0 in | Wt 280.4 lb

## 2019-07-30 DIAGNOSIS — R001 Bradycardia, unspecified: Secondary | ICD-10-CM | POA: Diagnosis not present

## 2019-07-30 DIAGNOSIS — G4733 Obstructive sleep apnea (adult) (pediatric): Secondary | ICD-10-CM | POA: Diagnosis not present

## 2019-07-30 DIAGNOSIS — I480 Paroxysmal atrial fibrillation: Secondary | ICD-10-CM | POA: Diagnosis not present

## 2019-07-30 DIAGNOSIS — Z7901 Long term (current) use of anticoagulants: Secondary | ICD-10-CM

## 2019-07-30 DIAGNOSIS — I10 Essential (primary) hypertension: Secondary | ICD-10-CM

## 2019-07-30 DIAGNOSIS — Z9989 Dependence on other enabling machines and devices: Secondary | ICD-10-CM | POA: Diagnosis not present

## 2019-07-30 MED ORDER — DILTIAZEM HCL ER COATED BEADS 240 MG PO CP24: 240.0000 mg | ORAL_CAPSULE | Freq: Every day | ORAL | 3 refills | Status: DC

## 2019-07-30 NOTE — Patient Instructions (Addendum)
After Visit Summary:  We will be checking the following labs today - NONE   Medication Instructions:    Continue with your current medicines. BUT  I am cutting the Diltiazem to 240 mg a day - this has been sent to your pharmacy   If you need a refill on your cardiac medications before your next appointment, please call your pharmacy.     Testing/Procedures To Be Arranged:  N/A  Follow-Up:   See Dr. Tamala Julian in 3 months with EKG    At [facility], you and your health needs are our priority.  As part of our continuing mission to provide you with exceptional heart care, we have created designated Provider Care Teams.  These Care Teams include your primary Cardiologist (physician) and Advanced Practice Providers (APPs -  Physician Assistants and Nurse Practitioners) who all work together to provide you with the care you need, when you need it.  Special Instructions:  . Stay safe, stay home, wash your hands for at least 20 seconds and wear a mask when out in public.  . It was good to talk with you today.  . Try to keep a check on your BP for [place].  . If your heart rate gets slower - or faster with this medicine change - let us know sooner - we will place a monitor   Call the [place] office at [phone] if you have any questions, problems or concerns.

## 2019-08-02 ENCOUNTER — Other Ambulatory Visit: Payer: Self-pay | Admitting: Interventional Cardiology

## 2019-08-05 ENCOUNTER — Other Ambulatory Visit: Payer: Self-pay | Admitting: Interventional Cardiology

## 2019-08-05 NOTE — Telephone Encounter (Signed)
Pt last saw Truitt Merle, NP on 07/30/19, last labs 02/06/19 Creat 0.79 at [facility] per [facility], age 74, weight 127.2kg, CrCl 154.3, based on CrCl pt is on appropriate dosage of Pradaxa 150mg  BID.  Will refill rx.

## 2019-08-12 ENCOUNTER — Other Ambulatory Visit: Payer: Self-pay | Admitting: Interventional Cardiology

## 2019-08-30 ENCOUNTER — Other Ambulatory Visit: Payer: Self-pay | Admitting: Interventional Cardiology

## 2019-09-23 ENCOUNTER — Other Ambulatory Visit: Payer: Self-pay | Admitting: Interventional Cardiology

## 2019-10-16 DIAGNOSIS — J45909 Unspecified asthma, uncomplicated: Secondary | ICD-10-CM | POA: Diagnosis not present

## 2019-10-16 DIAGNOSIS — D649 Anemia, unspecified: Secondary | ICD-10-CM | POA: Diagnosis not present

## 2019-10-16 DIAGNOSIS — N400 Enlarged prostate without lower urinary tract symptoms: Secondary | ICD-10-CM | POA: Diagnosis not present

## 2019-10-16 DIAGNOSIS — E7800 Pure hypercholesterolemia, unspecified: Secondary | ICD-10-CM | POA: Diagnosis not present

## 2019-10-16 DIAGNOSIS — I4891 Unspecified atrial fibrillation: Secondary | ICD-10-CM | POA: Diagnosis not present

## 2019-10-16 DIAGNOSIS — I10 Essential (primary) hypertension: Secondary | ICD-10-CM | POA: Diagnosis not present

## 2019-10-27 NOTE — Progress Notes (Signed)
Cardiology Office Note:    Date:  10/30/2019   ID:  Brian Myers, DOB 01-Feb-1946, MRN [id number]  PCP:  Brian Ruff, MD  Cardiologist:  Brian Grooms, MD   Referring MD: Brian Ruff, MD   Chief Complaint  Patient presents with  . Atrial Fibrillation  . Hyperlipidemia  . Hypertension    History of Present Illness:    Brian Myers is a 74 y.o. male with a hx of PAF,hypertension,HLD,sleep apnea,obesityand chronic anticoagulation therapy.  Has basically been shot in since Covid pandemic inception.  He denies Covid infection.  He has been vaccinated.  From cardiac standpoint, no prolonged episodes of palpitation to suggest atrial fibrillation.  Flecainide has been used for rhythm control.  He is tolerating this well.  Diltiazem dose was decreased because he was having issues with low blood pressures at times.  He is now on 240 mg of diltiazem CD each evening.  Episodes of weakness have improved.  He has occasionally, palpitations that are very brief and there is inability to capture any significant arrhythmia on a digital device that records rhythm strips.  He has had no prolonged episodes of irregular rhythm as would/did previously occur with atrial fibrillation.  He could not tolerate Eliquis and is now on Pradaxa.  No bleeding complications.  Has a significant 10-year cardiovascular risk profile: Diabetes mellitus (A1c 6.5), hypertension, hyperlipidemia (untreated), obesity,.  Past Medical History:  Diagnosis Date  . Arthritis    KNEES  . Asthma   . BPH (benign prostatic hypertrophy)    DIFFICULTY GETTING STREAM STARTED  . Dysrhythmia    ATRIAL FIB AND FLUTTER-DR. Linard Myers PT STATES HIS HEART IS USUALLY IN RHYTHM - BUT FOR PAST 10 DAYS OR MORE - HE HAS EXPERIENCED PALPITATIONS AND BREATHLESSNESS - HE HAS CALLED DR. Lemmie Myers. Brian Myers'S OFFICE   . Eczema    HANDS  . GERD (gastroesophageal reflux disease)   . H/O hiatal hernia   . Hyperlipidemia   .  Hyperlipidemia    DOES NOT TOLERATE ANY OF THE STATINS  . Persistent atrial fibrillation (HCC)    Dr Brian Myers  . Pneumonia    LAST EPISODE JAN 2015  . Right knee meniscal tear    PAIN RT KNEE  . Shortness of breath    WHEN HEART BEAT IRREGULAR  . Sleep apnea    with cpap    Past Surgical History:  Procedure Laterality Date  . CERVICAL FUSION  1991  . KNEE ARTHROSCOPY Right 04/17/2013   Procedure: ARTHROSCOPY RIGHT KNEE WITH MEDIAL AND LATERAL DEBRIDEMENT AND CONDRAPLASTY;  Surgeon: Gearlean Alf, MD;  Location: [facility];  Service: Orthopedics;  Laterality: Right;  . LEFT KNEE ARTHROSCPY  2009    Current Medications: Current Meds  Medication Sig  . albuterol (PROVENTIL HFA;VENTOLIN HFA) 108 (90 BASE) MCG/ACT inhaler Inhale 2 puffs into the lungs every 6 (six) hours as needed for wheezing or shortness of breath.   . dabigatran (PRADAXA) 150 MG CAPS capsule TAKE 1 CAPSULE BY MOUTH EVERY 12 HOURS  . diltiazem (CARDIZEM CD) 240 MG 24 hr capsule Take 1 capsule (240 mg total) by mouth daily.  Marland Kitchen esomeprazole (NEXIUM) 40 MG capsule Take 40 mg by mouth daily.  . flecainide (TAMBOCOR) 100 MG tablet TAKE 1 TABLET BY MOUTH TWICE A DAY  . fluticasone (FLONASE) 50 MCG/ACT nasal spray Place 1 spray into both nostrils 2 (two) times a day.   . losartan (COZAAR) 50 MG tablet TAKE 1 TABLET BY  MOUTH EVERY DAY  . methylcellulose (ARTIFICIAL TEARS) 1 % ophthalmic solution Place 1 drop into both eyes 2 (two) times daily as needed (dry eyes).  . SYMBICORT 160-4.5 MCG/ACT inhaler SMARTSIG:2 Puff(s) By Mouth Twice Daily     Allergies:   Penicillins, Sulfa drugs cross reactors, and Statins   Social History   Socioeconomic History  . Marital status: Married    Spouse name: Not on file  . Number of children: 2  . Years of education: Not on file  . Highest education level: Not on file  Occupational History    Employer: [employer]  Tobacco Use  . Smoking status: Never Smoker  . Smokeless  tobacco: Never Used  Vaping Use  . Vaping Use: Never used  Substance and Sexual Activity  . Alcohol use: Yes    Comment: at least 2-3 glasses of wine per day  . Drug use: No  . Sexual activity: Not on file  Other Topics Concern  . Not on file  Social History Narrative   Lives in [place] with spouse.  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   Social Determinants of Health   Financial Resource Strain:   . Difficulty of Paying Living Expenses: Not on file  Food Insecurity:   . Worried About Charity fundraiser in the Last Year: Not on file  . Ran Out of Food in the Last Year: Not on file  Transportation Needs:   . Lack of Transportation (Medical): Not on file  . Lack of Transportation (Non-Medical): Not on file  Physical Activity:   . Days of Exercise per Week: Not on file  . Minutes of Exercise per Session: Not on file  Stress:   . Feeling of Stress : Not on file  Social Connections:   . Frequency of Communication with Friends and Family: Not on file  . Frequency of Social Gatherings with Friends and Family: Not on file  . Attends Religious Services: Not on file  . Active Member of Clubs or Organizations: Not on file  . Attends Archivist Meetings: Not on file  . Marital Status: Not on file     Family History: The patient's family history includes Arrhythmia in his mother; Hypertension in his father; Mitral valve prolapse in his mother.  ROS:   Please see the history of present illness.    He denies complaints.  He has been sedentary.  He has not been eating the way he should.  He has gained significant weight.  He denies orthopnea.  He has heat intolerance.  He feels better on lower dose diltiazem.  All other systems reviewed and are negative.  EKGs/Labs/Other Studies Reviewed:    The following studies were reviewed today: No new data  EKG:  EKG normal sinus rhythm with nonspecific ST abnormality and flattened T waves.  Recent Labs: No results found for  requested labs within last 8760 hours.  Recent Lipid Panel No results found for: CHOL, TRIG, HDL, CHOLHDL, VLDL, LDLCALC, LDLDIRECT  Physical Exam:    VS:  BP 134/76   Pulse 63   Ht 5\' 11"  (1.803 m)   Wt 278 lb 3.2 oz (126.2 kg)   SpO2 95%   BMI 38.80 kg/m     Wt Readings from Last 3 Encounters:  10/30/19 278 lb 3.2 oz (126.2 kg)  07/30/19 280 lb 6.4 oz (127.2 kg)  07/11/18 270 lb (122.5 kg)     GEN: Marked obesity.. No acute distress HEENT:  A xanthelasma is present under the right eyelid. NECK: No JVD. LYMPHATICS: No lymphadenopathy CARDIAC:  RRR without murmur, gallop, or edema. VASCULAR:  Normal Pulses. No bruits. RESPIRATORY:  Clear to auscultation without rales, wheezing or rhonchi  ABDOMEN: Soft, non-tender, non-distended, No pulsatile mass, MUSCULOSKELETAL: No deformity  SKIN: Warm and dry NEUROLOGIC:  Alert and oriented x 3 PSYCHIATRIC:  Normal affect   ASSESSMENT:    1. Paroxysmal atrial fibrillation (HCC)   2. Essential hypertension   3. Other hyperlipidemia   4. Controlled type 2 diabetes mellitus with other circulatory complication, without long-term current use of insulin ([place])   5. OSA on CPAP   6. Chronic anticoagulation   7. Morbid obesity ([place])   8. Educated about COVID-19 virus infection   9. Xanthelasma    PLAN:    In order of problems listed above:  1. Seems to be rhythm controlled.  Having PACs and PVCs.  I informed him that we would not be able to make that disappear.  If he starts having prolonged episodes of palpitation perhaps he should wear a monitor. 2. Blood pressure is adequate.  Target at his age is 130/80 mmHg.  Diltiazem seems to be doing a good job. 3. The most recent lipid panel demonstrated an LDL of 222 in December 2020.  He has xanthelasma.  He is intolerant of statins.  He needs to start therapy for elevated LDL.  Now that he is diabetic, target LDL is less than 70.  He should be considered for ultra low-dose statin plus  bempedoic acid or PCSK9 therapy.  He wants to see if he can get his weight and diet under control.  I think he needs therapy now before he has an ischemic event.  He will speak to Dr. Drema Dallas about this. 4. I encouraged him to work on his A1c.  He is now officially diabetic with an A1c of 6.5.  He needs exercise, decrease carbohydrate diet, and weight loss. 5. No bleeding on Pradaxa. 6. Exercise and decreased carbohydrate intake will help with weight loss. 7. He is vaccinated and would like to have a booster if recommended. 8. Related to extremely elevated cholesterol.   Medication Adjustments/Labs and Tests Ordered: Current medicines are reviewed at length with the patient today.  Concerns regarding medicines are outlined above.  Orders Placed This Encounter  Procedures  . EKG 12-Lead   No orders of the defined types were placed in this encounter.   Patient Instructions  Medication Instructions:  Your physician recommends that you continue on your current medications as directed. Please refer to the Current Medication list given to you today.  *If you need a refill on your cardiac medications before your next appointment, please call your pharmacy*   Lab Work: None If you have labs (blood work) drawn today and your tests are completely normal, you will receive your results only by: Marland Kitchen MyChart Message (if you have MyChart) OR . A paper copy in the mail If you have any lab test that is abnormal or we need to change your treatment, we will call you to review the results.   Testing/Procedures: None   Follow-Up: At [facility], you and your health needs are our priority.  As part of our continuing mission to provide you with exceptional heart care, we have created designated Provider Care Teams.  These Care Teams include your primary Cardiologist (physician) and Advanced Practice Providers (APPs -  Physician Assistants and Nurse Practitioners) who all work together to  provide you  with the care you need, when you need it.  We recommend signing up for the patient portal called "MyChart".  Sign up information is provided on this After Visit Summary.  MyChart is used to connect with patients for Virtual Visits (Telemedicine).  Patients are able to view lab/test results, encounter notes, upcoming appointments, etc.  Non-urgent messages can be sent to your provider as well.   To learn more about what you can do with MyChart, go to [email or web address].    Your next appointment:   12 month(s)  The format for your next appointment:   In Person  Provider:   You may see Brian Grooms, MD or one of the following Advanced Practice Providers on your designated Care Team:    Truitt Merle, NP  Cecilie Kicks, NP  Kathyrn Drown, NP    Other Instructions      Signed, Brian Grooms, MD  10/30/2019 3:43 PM    [place]

## 2019-10-30 ENCOUNTER — Ambulatory Visit (INDEPENDENT_AMBULATORY_CARE_PROVIDER_SITE_OTHER): Payer: Medicare Other | Admitting: Interventional Cardiology

## 2019-10-30 ENCOUNTER — Encounter: Payer: Self-pay | Admitting: Interventional Cardiology

## 2019-10-30 ENCOUNTER — Other Ambulatory Visit: Payer: Self-pay

## 2019-10-30 VITALS — BP 134/76 | HR 63 | Ht 71.0 in | Wt 278.2 lb

## 2019-10-30 DIAGNOSIS — Z7901 Long term (current) use of anticoagulants: Secondary | ICD-10-CM

## 2019-10-30 DIAGNOSIS — Z7189 Other specified counseling: Secondary | ICD-10-CM | POA: Diagnosis not present

## 2019-10-30 DIAGNOSIS — G4733 Obstructive sleep apnea (adult) (pediatric): Secondary | ICD-10-CM | POA: Diagnosis not present

## 2019-10-30 DIAGNOSIS — I480 Paroxysmal atrial fibrillation: Secondary | ICD-10-CM | POA: Diagnosis not present

## 2019-10-30 DIAGNOSIS — Z9989 Dependence on other enabling machines and devices: Secondary | ICD-10-CM

## 2019-10-30 DIAGNOSIS — E7849 Other hyperlipidemia: Secondary | ICD-10-CM

## 2019-10-30 DIAGNOSIS — H0260 Xanthelasma of unspecified eye, unspecified eyelid: Secondary | ICD-10-CM

## 2019-10-30 DIAGNOSIS — E1159 Type 2 diabetes mellitus with other circulatory complications: Secondary | ICD-10-CM | POA: Diagnosis not present

## 2019-10-30 DIAGNOSIS — I10 Essential (primary) hypertension: Secondary | ICD-10-CM

## 2019-10-30 NOTE — Patient Instructions (Signed)

## 2019-11-27 DIAGNOSIS — Z23 Encounter for immunization: Secondary | ICD-10-CM | POA: Diagnosis not present

## 2019-12-03 DIAGNOSIS — Z23 Encounter for immunization: Secondary | ICD-10-CM | POA: Diagnosis not present

## 2019-12-29 ENCOUNTER — Other Ambulatory Visit: Payer: Self-pay | Admitting: Interventional Cardiology

## 2020-01-08 DIAGNOSIS — J45909 Unspecified asthma, uncomplicated: Secondary | ICD-10-CM | POA: Diagnosis not present

## 2020-01-08 DIAGNOSIS — I10 Essential (primary) hypertension: Secondary | ICD-10-CM | POA: Diagnosis not present

## 2020-01-08 DIAGNOSIS — N400 Enlarged prostate without lower urinary tract symptoms: Secondary | ICD-10-CM | POA: Diagnosis not present

## 2020-01-08 DIAGNOSIS — D649 Anemia, unspecified: Secondary | ICD-10-CM | POA: Diagnosis not present

## 2020-01-08 DIAGNOSIS — E7800 Pure hypercholesterolemia, unspecified: Secondary | ICD-10-CM | POA: Diagnosis not present

## 2020-01-08 DIAGNOSIS — K219 Gastro-esophageal reflux disease without esophagitis: Secondary | ICD-10-CM | POA: Diagnosis not present

## 2020-01-08 DIAGNOSIS — I4891 Unspecified atrial fibrillation: Secondary | ICD-10-CM | POA: Diagnosis not present

## 2020-01-21 ENCOUNTER — Other Ambulatory Visit: Payer: Self-pay | Admitting: Interventional Cardiology

## 2020-01-21 NOTE — Telephone Encounter (Signed)
Pt last saw Dr Tamala Julian 10/30/19, last labs 02/06/19 Creat 0.79 at [facility] per [facility], age 74, weight 126.2kg, CrCl 150.87, based on CrCl pt is on appropriate dosage of Pradaxa 150mg  BID.  Will refill rx.

## 2020-01-27 ENCOUNTER — Other Ambulatory Visit: Payer: Self-pay | Admitting: Interventional Cardiology

## 2020-01-29 DIAGNOSIS — R7303 Prediabetes: Secondary | ICD-10-CM | POA: Diagnosis not present

## 2020-01-29 DIAGNOSIS — E7800 Pure hypercholesterolemia, unspecified: Secondary | ICD-10-CM | POA: Diagnosis not present

## 2020-01-29 DIAGNOSIS — R972 Elevated prostate specific antigen [PSA]: Secondary | ICD-10-CM | POA: Diagnosis not present

## 2020-01-29 DIAGNOSIS — N400 Enlarged prostate without lower urinary tract symptoms: Secondary | ICD-10-CM | POA: Diagnosis not present

## 2020-01-29 DIAGNOSIS — I4891 Unspecified atrial fibrillation: Secondary | ICD-10-CM | POA: Diagnosis not present

## 2020-01-29 DIAGNOSIS — I10 Essential (primary) hypertension: Secondary | ICD-10-CM | POA: Diagnosis not present

## 2020-01-29 DIAGNOSIS — G720 Drug-induced myopathy: Secondary | ICD-10-CM | POA: Diagnosis not present

## 2020-02-27 DIAGNOSIS — I10 Essential (primary) hypertension: Secondary | ICD-10-CM | POA: Diagnosis not present

## 2020-02-27 DIAGNOSIS — E7800 Pure hypercholesterolemia, unspecified: Secondary | ICD-10-CM | POA: Diagnosis not present

## 2020-02-27 DIAGNOSIS — D649 Anemia, unspecified: Secondary | ICD-10-CM | POA: Diagnosis not present

## 2020-02-27 DIAGNOSIS — K219 Gastro-esophageal reflux disease without esophagitis: Secondary | ICD-10-CM | POA: Diagnosis not present

## 2020-02-27 DIAGNOSIS — J45909 Unspecified asthma, uncomplicated: Secondary | ICD-10-CM | POA: Diagnosis not present

## 2020-02-27 DIAGNOSIS — I4891 Unspecified atrial fibrillation: Secondary | ICD-10-CM | POA: Diagnosis not present

## 2020-02-27 DIAGNOSIS — N400 Enlarged prostate without lower urinary tract symptoms: Secondary | ICD-10-CM | POA: Diagnosis not present

## 2020-03-18 DIAGNOSIS — J45909 Unspecified asthma, uncomplicated: Secondary | ICD-10-CM | POA: Diagnosis not present

## 2020-03-18 DIAGNOSIS — I10 Essential (primary) hypertension: Secondary | ICD-10-CM | POA: Diagnosis not present

## 2020-03-18 DIAGNOSIS — K219 Gastro-esophageal reflux disease without esophagitis: Secondary | ICD-10-CM | POA: Diagnosis not present

## 2020-03-18 DIAGNOSIS — D649 Anemia, unspecified: Secondary | ICD-10-CM | POA: Diagnosis not present

## 2020-03-18 DIAGNOSIS — E7800 Pure hypercholesterolemia, unspecified: Secondary | ICD-10-CM | POA: Diagnosis not present

## 2020-03-18 DIAGNOSIS — I4891 Unspecified atrial fibrillation: Secondary | ICD-10-CM | POA: Diagnosis not present

## 2020-03-18 DIAGNOSIS — N400 Enlarged prostate without lower urinary tract symptoms: Secondary | ICD-10-CM | POA: Diagnosis not present

## 2020-05-04 ENCOUNTER — Ambulatory Visit (INDEPENDENT_AMBULATORY_CARE_PROVIDER_SITE_OTHER): Payer: Medicare Other | Admitting: Adult Health

## 2020-05-04 ENCOUNTER — Encounter: Payer: Self-pay | Admitting: Adult Health

## 2020-05-04 ENCOUNTER — Other Ambulatory Visit: Payer: Self-pay

## 2020-05-04 VITALS — BP 158/82 | HR 65 | Ht 71.0 in | Wt 280.0 lb

## 2020-05-04 DIAGNOSIS — G4733 Obstructive sleep apnea (adult) (pediatric): Secondary | ICD-10-CM | POA: Diagnosis not present

## 2020-05-04 DIAGNOSIS — Z9989 Dependence on other enabling machines and devices: Secondary | ICD-10-CM

## 2020-05-04 NOTE — Progress Notes (Signed)
PATIENT: Brian Myers DOB: Dec 29, 1945  REASON FOR VISIT: follow up HISTORY FROM: patient  HISTORY OF PRESENT ILLNESS: Today 05/04/20:  Brian Myers is a 75 year old male with a history of obstructive sleep apnea on CPAP.  He returns today for follow-up.  The patient was scheduled for a CPAP titration to confirm overnight pulse oximetry results.  The patient canceled this.  He does not want to proceed with this at this time.  He states that he does not sleep well when he comes and to the office for the studies.  Overall he states that the CPAP continues to work well for him.  He denies any significant issues.    HISTORY 07/01/19:  Brian Myers is a 75 year old male with a history of obstructive sleep apnea on CPAP.  He joins me today for a virtual visit.  The patient wanted to review his overnight pulse oximetry in more detail before scheduling CPAP titration.  I advised that his overnight pulse oximetry showed that he was less than 88% for approximately 4 hours.  This would qualify him for nocturnal oxygen but this should be verified with a CPAP titration.  Patient voiced understanding.  He is ready to schedule CPAP titration   REVIEW OF SYSTEMS: Out of a complete 14 system review of symptoms, the patient complains only of the following symptoms, and all other reviewed systems are negative.  FSS 26 ESS 4  ALLERGIES: Allergies  Allergen Reactions  . Penicillins Anaphylaxis  . Sulfa Drugs Cross Reactors Anaphylaxis  . Statins Other (See Comments)    INTOLERANT TO STATINS - SEVERE MUSCLE CRAMPS    HOME MEDICATIONS: Outpatient Medications Prior to Visit  Medication Sig Dispense Refill  . albuterol (PROVENTIL HFA;VENTOLIN HFA) 108 (90 BASE) MCG/ACT inhaler Inhale 2 puffs into the lungs every 6 (six) hours as needed for wheezing or shortness of breath.    . dabigatran (PRADAXA) 150 MG CAPS capsule TAKE 1 CAPSULE BY MOUTH EVERY 12 HOURS 180 capsule 1  . esomeprazole (NEXIUM) 40 MG  capsule Take 40 mg by mouth daily.  1  . flecainide (TAMBOCOR) 100 MG tablet TAKE 1 TABLET BY MOUTH TWICE A DAY 180 tablet 2  . fluticasone (FLONASE) 50 MCG/ACT nasal spray Place 1 spray into both nostrils 2 (two) times a day.    . losartan (COZAAR) 50 MG tablet TAKE 1 TABLET BY MOUTH EVERY DAY 90 tablet 2  . methylcellulose (ARTIFICIAL TEARS) 1 % ophthalmic solution Place 1 drop into both eyes 2 (two) times daily as needed (dry eyes).    . SYMBICORT 160-4.5 MCG/ACT inhaler SMARTSIG:2 Puff(s) By Mouth Twice Daily    . diltiazem (CARDIZEM CD) 240 MG 24 hr capsule Take 1 capsule (240 mg total) by mouth daily. 90 capsule 3   No facility-administered medications prior to visit.    PAST MEDICAL HISTORY: Past Medical History:  Diagnosis Date  . Arthritis    KNEES  . Asthma   . BPH (benign prostatic hypertrophy)    DIFFICULTY GETTING STREAM STARTED  . Dysrhythmia    ATRIAL FIB AND FLUTTER-DR. Linard Millers PT STATES HIS HEART IS USUALLY IN RHYTHM - BUT FOR PAST 10 DAYS OR MORE - HE HAS EXPERIENCED PALPITATIONS AND BREATHLESSNESS - HE HAS CALLED DR. Lemmie Evens. [facility] OFFICE   . Eczema    HANDS  . GERD (gastroesophageal reflux disease)   . H/O hiatal hernia   . Hyperlipidemia   . Hyperlipidemia    DOES NOT TOLERATE ANY OF THE  STATINS  . Persistent atrial fibrillation (HCC)    Dr Tamala Julian  . Pneumonia    LAST EPISODE JAN 2015  . Right knee meniscal tear    PAIN RT KNEE  . Shortness of breath    WHEN HEART BEAT IRREGULAR  . Sleep apnea    with cpap    PAST SURGICAL HISTORY: Past Surgical History:  Procedure Laterality Date  . CERVICAL FUSION  1991  . KNEE ARTHROSCOPY Right 04/17/2013   Procedure: ARTHROSCOPY RIGHT KNEE WITH MEDIAL AND LATERAL DEBRIDEMENT AND CONDRAPLASTY;  Surgeon: Gearlean Alf, MD;  Location: [facility];  Service: Orthopedics;  Laterality: Right;  . LEFT KNEE ARTHROSCPY  2009    FAMILY HISTORY: Family History  Problem Relation Age of Onset  . Arrhythmia Mother        atrial  fib  . Mitral valve prolapse Mother   . Hypertension Father     SOCIAL HISTORY: Social History   Socioeconomic History  . Marital status: Married    Spouse name: Not on file  . Number of children: 2  . Years of education: Not on file  . Highest education level: Not on file  Occupational History    Employer: [employer]  Tobacco Use  . Smoking status: Never Smoker  . Smokeless tobacco: Never Used  Vaping Use  . Vaping Use: Never used  Substance and Sexual Activity  . Alcohol use: Yes    Comment: at least 2-3 glasses of wine per day  . Drug use: No  . Sexual activity: Not on file  Other Topics Concern  . Not on file  Social History Narrative   Lives in [place] with spouse.  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   Social Determinants of Health   Financial Resource Strain: Not on file  Food Insecurity: Not on file  Transportation Needs: Not on file  Physical Activity: Not on file  Stress: Not on file  Social Connections: Not on file  Intimate Partner Violence: Not on file      PHYSICAL EXAM  Vitals:   05/04/20 1430  BP: (!) 158/82  Pulse: 65  Weight: 280 lb (127 kg)  Height: 5\' 11"  (1.803 m)   Body mass index is 39.05 kg/m.  Generalized: Well developed, in no acute distress  Chest: Lungs clear to auscultation bilaterally  Neurological examination  Mentation: Alert oriented to time, place, history taking. Follows all commands speech and language fluent Cranial nerve II-XII: Extraocular movements were full, visual field were full on confrontational test Head turning and shoulder shrug  were normal and symmetric. Motor: The motor testing reveals 5 over 5 strength of all 4 extremities. Good symmetric motor tone is noted throughout.  Sensory: Sensory testing is intact to soft touch on all 4 extremities. No evidence of extinction is noted.  Gait and station: Gait is normal.    DIAGNOSTIC DATA (LABS, IMAGING, TESTING) - I reviewed patient records,  labs, notes, testing and imaging myself where available.  Lab Results  Component Value Date   WBC 8.3 04/12/2013   HGB 14.0 11/27/2015   HCT 35.9 (L) 06/07/2013   MCV 83.8 04/12/2013   PLT 297 04/12/2013      Component Value Date/Time   NA 141 11/27/2015 1442   K 3.5 11/27/2015 1442   CL 104 11/27/2015 1442   CO2 22 11/27/2015 1442   GLUCOSE 98 11/27/2015 1442   BUN 13 11/27/2015 1442   CREATININE 0.98 11/27/2015 1442   CALCIUM  8.7 11/27/2015 1442   ALBUMIN 3.5 06/07/2013 1340   GFRNONAA 85 (L) 04/12/2013 1500   GFRAA >90 04/12/2013 1500   No results found for: CHOL, HDL, LDLCALC, LDLDIRECT, TRIG, CHOLHDL No results found for: HGBA1C No results found for: VITAMINB12 No results found for: TSH    ASSESSMENT AND PLAN 75 y.o. year old male  has a past medical history of Arthritis, Asthma, BPH (benign prostatic hypertrophy), Dysrhythmia, Eczema, GERD (gastroesophageal reflux disease), H/O hiatal hernia, Hyperlipidemia, Hyperlipidemia, Persistent atrial fibrillation ([place]), Pneumonia, Right knee meniscal tear, Shortness of breath, and Sleep apnea. here with:  1. OSA on CPAP  - CPAP compliance excellent - Good treatment of AHI  - Encourage patient to use CPAP nightly and > 4 hours each night - F/U in 1 year or sooner if needed   I spent 20 minutes of face-to-face and non-face-to-face time with patient.  This included previsit chart review, lab review, study review, order entry, electronic health record documentation, patient education.  Ward Givens, MSN, NP-C 05/04/2020, 2:33 PM [facility] [address] [place], [place] [address] [phone]

## 2020-05-04 NOTE — Patient Instructions (Signed)
Continue using CPAP nightly and greater than 4 hours each night °If your symptoms worsen or you develop new symptoms please let us know.  ° °

## 2020-06-01 NOTE — Telephone Encounter (Signed)
Called the pt in response to his My Chart message and he repost that since 2 days ago he has noticed that his  Fitbit is showing his HR to be 115-120 with minimal exertion..he says it improves when he sits down and puts his feet up and goes to 65-70.   His BP 140/80.   He denies dizziness, SOB, chest pain, just a few palpitations but not constant.   His last OV was this past Fall.. I made him an appt for this Wed with Vin Bhagat. He will contimue to monitor his symptoms and if anything worsens he will call back or go the ED... he has been eating and drinking well and no other changes in his medical health.   Will forward to Dr.mith and will let the pt know if he has any further recommendations.    Last OV 10/2019: 1. Seems to be rhythm controlled.  Having PACs and PVCs.  I informed him that we would not be able to make that disappear.  If he starts having prolonged episodes of palpitation perhaps he should wear a monitor.

## 2020-06-03 ENCOUNTER — Other Ambulatory Visit: Payer: Self-pay

## 2020-06-03 ENCOUNTER — Encounter: Payer: Self-pay | Admitting: Physician Assistant

## 2020-06-03 ENCOUNTER — Ambulatory Visit (INDEPENDENT_AMBULATORY_CARE_PROVIDER_SITE_OTHER): Payer: Medicare Other | Admitting: Physician Assistant

## 2020-06-03 ENCOUNTER — Encounter: Payer: Self-pay | Admitting: *Deleted

## 2020-06-03 ENCOUNTER — Ambulatory Visit: Payer: Medicare Other | Admitting: Physician Assistant

## 2020-06-03 VITALS — BP 144/90 | HR 122 | Ht 71.0 in | Wt 278.2 lb

## 2020-06-03 DIAGNOSIS — I10 Essential (primary) hypertension: Secondary | ICD-10-CM | POA: Diagnosis not present

## 2020-06-03 DIAGNOSIS — I484 Atypical atrial flutter: Secondary | ICD-10-CM | POA: Diagnosis not present

## 2020-06-03 DIAGNOSIS — I480 Paroxysmal atrial fibrillation: Secondary | ICD-10-CM

## 2020-06-03 DIAGNOSIS — Z01818 Encounter for other preprocedural examination: Secondary | ICD-10-CM | POA: Diagnosis not present

## 2020-06-03 NOTE — Progress Notes (Signed)
Cardiology Office Note Date:  06/03/2020  Patient ID:  Brian, Myers [birth year], MRN [id number] PCP:  Leighton Ruff, MD  Cardiologist:  Dr. Tamala Julian Electrophysiologist: Dr. Rayann Heman [phone])    Chief Complaint: recurrent AF  History of Present Illness: Brian Myers is a 75 y.o. male with history of OSA w/CPAP, HLD, DM, morbid obesity  He comes in today to be seen for Dr. Rayann Heman, last seen by him 2018, at that time reported AFib/palpitations lasting <1 hour about 2x/month.  He was still drinking about 4drinks/day. Discussed failure of flecainide, though his persistent AFib more paroxysmal.  Discussed ablation strategy, the pt was going to think about it, Dr. Rayann Heman mentioned need for steps towards lifestyle changes first.  He saw Dr. Tamala Julian Sept 2021, c/w palpitations though unable to catch rhythm strips via his wearable tech.  Described as brief, no prolonged episodes, remained on flecainide.  Dr. Tamala Julian mentioned PAC's/PVCs, if started to have prolonged episodes perhaps consider a monitor. Mentioned dilt was historically reduced 2/2 BP  4/4 messages with reports of HR 120's, seems a kardia tracing was forwarded to Dr. Tamala Julian, noted to be in AFib and referred to EP service.  TODAY He is accompanied by his wife. Reports that he was doing some light cleaning in the garage and looked at his watch to see what time it was and noted his HR was 120's He felt well, had no perception of palpitations, fast HR No CP, SOB or unusual DOE. No dizzy spells, near syncope or syncope  Hs usual resting HR is in the 50's Reports compliance with his CPAP No bleeding or signs of bleeding  He in review of his watch data HRs 110's-120's since Friday afternoon  Afib Hx Diagnosed 20+ years ago  AAD Hx Flecainide started 2012, is current   Past Medical History:  Diagnosis Date  . Arthritis    KNEES  . Asthma   . BPH (benign prostatic hypertrophy)    DIFFICULTY GETTING STREAM STARTED  .  Dysrhythmia    ATRIAL FIB AND FLUTTER-DR. Linard Millers PT STATES HIS HEART IS USUALLY IN RHYTHM - BUT FOR PAST 10 DAYS OR MORE - HE HAS EXPERIENCED PALPITATIONS AND BREATHLESSNESS - HE HAS CALLED DR. Lemmie Evens. [facility]   . Eczema    HANDS  . GERD (gastroesophageal reflux disease)   . H/O hiatal hernia   . Hyperlipidemia   . Hyperlipidemia    DOES NOT TOLERATE ANY OF THE STATINS  . Persistent atrial fibrillation (HCC)    Dr Tamala Julian  . Pneumonia    LAST EPISODE JAN 2015  . Right knee meniscal tear    PAIN RT KNEE  . Shortness of breath    WHEN HEART BEAT IRREGULAR  . Sleep apnea    with cpap    Past Surgical History:  Procedure Laterality Date  . CERVICAL FUSION  1991  . KNEE ARTHROSCOPY Right 04/17/2013   Procedure: ARTHROSCOPY RIGHT KNEE WITH MEDIAL AND LATERAL DEBRIDEMENT AND CONDRAPLASTY;  Surgeon: Gearlean Alf, MD;  Location: [facility];  Service: Orthopedics;  Laterality: Right;  . LEFT KNEE ARTHROSCPY  2009    Current Outpatient Medications  Medication Sig Dispense Refill  . albuterol (PROVENTIL HFA;VENTOLIN HFA) 108 (90 BASE) MCG/ACT inhaler Inhale 2 puffs into the lungs every 6 (six) hours as needed for wheezing or shortness of breath.    . dabigatran (PRADAXA) 150 MG CAPS capsule TAKE 1 CAPSULE BY MOUTH EVERY 12 HOURS 180 capsule 1  .  esomeprazole (NEXIUM) 40 MG capsule Take 40 mg by mouth daily.  1  . flecainide (TAMBOCOR) 100 MG tablet TAKE 1 TABLET BY MOUTH TWICE A DAY 180 tablet 2  . fluticasone (FLONASE) 50 MCG/ACT nasal spray Place 1 spray into both nostrils 2 (two) times a day.    . losartan (COZAAR) 50 MG tablet TAKE 1 TABLET BY MOUTH EVERY DAY 90 tablet 2  . methylcellulose (ARTIFICIAL TEARS) 1 % ophthalmic solution Place 1 drop into both eyes 2 (two) times daily as needed (dry eyes).    . SYMBICORT 160-4.5 MCG/ACT inhaler SMARTSIG:2 Puff(s) By Mouth Twice Daily    . diltiazem (CARDIZEM CD) 240 MG 24 hr capsule Take 1 capsule (240 mg total) by mouth daily. 90 capsule 3    No current facility-administered medications for this visit.    Allergies:   Penicillins, Sulfa drugs cross reactors, and Statins   Social History:  The patient  reports that he has never smoked. He has never used smokeless tobacco. He reports current alcohol use. He reports that he does not use drugs.   Family History:  The patient's family history includes Arrhythmia in his mother; Hypertension in his father; Mitral valve prolapse in his mother.  ROS:  Please see the history of present illness.    All other systems are reviewed and otherwise negative.   PHYSICAL EXAM:  VS:  BP (!) 144/90   Pulse (!) 122   Ht 5\' 11"  (1.803 m)   Wt 278 lb 3.2 oz (126.2 kg)   SpO2 94%   BMI 38.80 kg/m  BMI: Body mass index is 38.8 kg/m. Well nourished, well developed, in no acute distress HEENT: normocephalic, atraumatic Neck: no JVD, carotid bruits or masses Cardiac:  RRR; no significant murmurs, no rubs, or gallops Lungs:  CTA b/l, no wheezing, rhonchi or rales Abd: soft, nontender, obese MS: no deformity or atrophy Ext: trace-1+ edema (he reports his baseline) Skin: warm and dry, no rash Neuro:  No gross deficits appreciated Psych: euthymic mood, full affect   EKG:  Done today and reviewed by myself shows  Aflutter (looks atypical), 122bpm   08/30/2016: TTE Study Conclusions  - Left ventricle: The cavity size was normal. Wall thickness was  normal. Systolic function was normal. The estimated ejection  fraction was in the range of 60% to 65%. Wall motion was normal;  there were no regional wall motion abnormalities. Features are  consistent with a pseudonormal left ventricular filling pattern,  with concomitant abnormal relaxation and increased filling  pressure (grade 2 diastolic dysfunction).  - Right atrium: The atrium was mildly dilated.    Recent Labs: No results found for requested labs within last 8760 hours.  No results found for requested labs within last  8760 hours.   CrCl cannot be calculated (Patient's most recent lab result is older than the maximum 21 days allowed.).   Wt Readings from Last 3 Encounters:  06/03/20 278 lb 3.2 oz (126.2 kg)  05/04/20 280 lb (127 kg)  10/30/19 278 lb 3.2 oz (126.2 kg)     Other studies reviewed: Additional studies/records reviewed today include: summarized above  ASSESSMENT AND PLAN:  1. Paroxysmal AFib     CHA2DS2Vasc is 3, on pradaxa, no recent labs for review     Labs today  asymptomatic He missed all of his PM medicine Friday evening including his pradaxa This is very unusual for him, and no missed doses since  recommendation TEE/DCCV given rates 110's-120's Discussed that should  he have recurrent AFlutter or fib post DCCV early or recurrently may need to consider another AAD BMI is 38, not likely a good ablation candidate  He is asymptomatic and BP is OK, with baseline bradycardia will not increase his diltiazem We discussed TEE/DCCV procedures potential risks and benefits, he is agreeable to proceed Reviewed plan with Dr. Tamala Julian Will plan follow up with Dr. Rayann Heman in a month or so, post DCCV   2. HTN     No change today, follow  3. HLD     Not addressed today    Disposition: F/u as above   Current medicines are reviewed at length with the patient today.  The patient did not have any concerns regarding medicines.  Venetia Night, PA-C 06/03/2020 5:38 PM     [address] [place] [address] [phone] (office)  [phone] (fax)

## 2020-06-03 NOTE — Patient Instructions (Addendum)
Medication Instructions:   Your physician recommends that you continue on your current medications as directed. Please refer to the Current Medication list given to you today.   *If you need a refill on your cardiac medications before your next appointment, please call your pharmacy*   Lab Work: BMET AND CBC TODAY   If you have labs (blood work) drawn today and your tests are completely normal, you will receive your results only by: Marland Kitchen MyChart Message (if you have MyChart) OR . A paper copy in the mail If you have any lab test that is abnormal or we need to change your treatment, we will call you to review the results.   Testing/Procedures:  SEE LETTER Your physician has requested that you have a TEE/Cardioversion. During a TEE, sound waves are used to create images of your heart. It provides your doctor with information about the size and shape of your heart and how well your heart's chambers and valves are working. In this test, a transducer is attached to the end of a flexible tube that is guided down you throat and into your esophagus (the tube leading from your mouth to your stomach) to get a more detailed image of your heart. Once the TEE has determined that a blood clot is not present, the cardioversion begins. Electrical Cardioversion uses a jolt of electricity to your heart either through paddles or wired patches attached to your chest. This is a controlled, usually prescheduled, procedure. This procedure is done at the hospital and you are not awake during the procedure. You usually go home the day of the procedure. Please see the instruction sheet given to you today for more information.   Follow-Up: At [facility], you and your health needs are our priority.  As part of our continuing mission to provide you with exceptional heart care, we have created designated Provider Care Teams.  These Care Teams include your primary Cardiologist (physician) and Advanced Practice Providers (APPs  -  Physician Assistants and Nurse Practitioners) who all work together to provide you with the care you need, when you need it.  We recommend signing up for the patient portal called "MyChart".  Sign up information is provided on this After Visit Summary.  MyChart is used to connect with patients for Virtual Visits (Telemedicine).  Patients are able to view lab/test results, encounter notes, upcoming appointments, etc.  Non-urgent messages can be sent to your provider as well.   To learn more about what you can do with MyChart, go to [email or web address].    Your next appointment:   1 month(s)  AFTER 06-10-20 POST TEE/DCCV   The format for your next appointment:   In Person  Provider:   Thompson Grayer, MD only (CONTACT [facility] SCHEDULING ISSUES)   Other Instructions

## 2020-06-03 NOTE — H&P (View-Only) (Signed)
Cardiology Office Note Date:  06/03/2020  Patient ID:  Brian Myers Jul 15, 1945, MRN [id number] PCP:  Leighton Ruff, MD  Cardiologist:  Dr. Tamala Julian Electrophysiologist: Dr. Rayann Heman [phone])    Chief Complaint: recurrent AF  History of Present Illness: Brian Myers is a 75 y.o. male with history of OSA w/CPAP, HLD, DM, morbid obesity  He comes in today to be seen for Dr. Rayann Heman, last seen by him 2018, at that time reported AFib/palpitations lasting <1 hour about 2x/month.  He was still drinking about 4drinks/day. Discussed failure of flecainide, though his persistent AFib more paroxysmal.  Discussed ablation strategy, the pt was going to think about it, Dr. Rayann Heman mentioned need for steps towards lifestyle changes first.  He saw Dr. Tamala Julian Sept 2021, c/w palpitations though unable to catch rhythm strips via his wearable tech.  Described as brief, no prolonged episodes, remained on flecainide.  Dr. Tamala Julian mentioned PAC's/PVCs, if started to have prolonged episodes perhaps consider a monitor. Mentioned dilt was historically reduced 2/2 BP  4/4 messages with reports of HR 120's, seems a kardia tracing was forwarded to Dr. Tamala Julian, noted to be in AFib and referred to EP service.  TODAY He is accompanied by his wife. Reports that he was doing some light cleaning in the garage and looked at his watch to see what time it was and noted his HR was 120's He felt well, had no perception of palpitations, fast HR No CP, SOB or unusual DOE. No dizzy spells, near syncope or syncope  Hs usual resting HR is in the 50's Reports compliance with his CPAP No bleeding or signs of bleeding  He in review of his watch data HRs 110's-120's since Friday afternoon  Afib Hx Diagnosed 20+ years ago  AAD Hx Flecainide started 2012, is current   Past Medical History:  Diagnosis Date  . Arthritis    KNEES  . Asthma   . BPH (benign prostatic hypertrophy)    DIFFICULTY GETTING STREAM STARTED  .  Dysrhythmia    ATRIAL FIB AND FLUTTER-DR. Linard Millers PT STATES HIS HEART IS USUALLY IN RHYTHM - BUT FOR PAST 10 DAYS OR MORE - HE HAS EXPERIENCED PALPITATIONS AND BREATHLESSNESS - HE HAS CALLED DR. Lemmie Evens. [facility]   . Eczema    HANDS  . GERD (gastroesophageal reflux disease)   . H/O hiatal hernia   . Hyperlipidemia   . Hyperlipidemia    DOES NOT TOLERATE ANY OF THE STATINS  . Persistent atrial fibrillation (HCC)    Dr Tamala Julian  . Pneumonia    LAST EPISODE JAN 2015  . Right knee meniscal tear    PAIN RT KNEE  . Shortness of breath    WHEN HEART BEAT IRREGULAR  . Sleep apnea    with cpap    Past Surgical History:  Procedure Laterality Date  . CERVICAL FUSION  1991  . KNEE ARTHROSCOPY Right 04/17/2013   Procedure: ARTHROSCOPY RIGHT KNEE WITH MEDIAL AND LATERAL DEBRIDEMENT AND CONDRAPLASTY;  Surgeon: Gearlean Alf, MD;  Location: [facility];  Service: Orthopedics;  Laterality: Right;  . LEFT KNEE ARTHROSCPY  2009    Current Outpatient Medications  Medication Sig Dispense Refill  . albuterol (PROVENTIL HFA;VENTOLIN HFA) 108 (90 BASE) MCG/ACT inhaler Inhale 2 puffs into the lungs every 6 (six) hours as needed for wheezing or shortness of breath.    . dabigatran (PRADAXA) 150 MG CAPS capsule TAKE 1 CAPSULE BY MOUTH EVERY 12 HOURS 180 capsule 1  .  esomeprazole (NEXIUM) 40 MG capsule Take 40 mg by mouth daily.  1  . flecainide (TAMBOCOR) 100 MG tablet TAKE 1 TABLET BY MOUTH TWICE A DAY 180 tablet 2  . fluticasone (FLONASE) 50 MCG/ACT nasal spray Place 1 spray into both nostrils 2 (two) times a day.    . losartan (COZAAR) 50 MG tablet TAKE 1 TABLET BY MOUTH EVERY DAY 90 tablet 2  . methylcellulose (ARTIFICIAL TEARS) 1 % ophthalmic solution Place 1 drop into both eyes 2 (two) times daily as needed (dry eyes).    . SYMBICORT 160-4.5 MCG/ACT inhaler SMARTSIG:2 Puff(s) By Mouth Twice Daily    . diltiazem (CARDIZEM CD) 240 MG 24 hr capsule Take 1 capsule (240 mg total) by mouth daily. 90 capsule 3    No current facility-administered medications for this visit.    Allergies:   Penicillins, Sulfa drugs cross reactors, and Statins   Social History:  The patient  reports that he has never smoked. He has never used smokeless tobacco. He reports current alcohol use. He reports that he does not use drugs.   Family History:  The patient's family history includes Arrhythmia in his mother; Hypertension in his father; Mitral valve prolapse in his mother.  ROS:  Please see the history of present illness.    All other systems are reviewed and otherwise negative.   PHYSICAL EXAM:  VS:  BP (!) 144/90   Pulse (!) 122   Ht 5\' 11"  (1.803 m)   Wt 278 lb 3.2 oz (126.2 kg)   SpO2 94%   BMI 38.80 kg/m  BMI: Body mass index is 38.8 kg/m. Well nourished, well developed, in no acute distress HEENT: normocephalic, atraumatic Neck: no JVD, carotid bruits or masses Cardiac:  RRR; no significant murmurs, no rubs, or gallops Lungs:  CTA b/l, no wheezing, rhonchi or rales Abd: soft, nontender, obese MS: no deformity or atrophy Ext: trace-1+ edema (he reports his baseline) Skin: warm and dry, no rash Neuro:  No gross deficits appreciated Psych: euthymic mood, full affect   EKG:  Done today and reviewed by myself shows  Aflutter (looks atypical), 122bpm   08/30/2016: TTE Study Conclusions  - Left ventricle: The cavity size was normal. Wall thickness was  normal. Systolic function was normal. The estimated ejection  fraction was in the range of 60% to 65%. Wall motion was normal;  there were no regional wall motion abnormalities. Features are  consistent with a pseudonormal left ventricular filling pattern,  with concomitant abnormal relaxation and increased filling  pressure (grade 2 diastolic dysfunction).  - Right atrium: The atrium was mildly dilated.    Recent Labs: No results found for requested labs within last 8760 hours.  No results found for requested labs within last  8760 hours.   CrCl cannot be calculated (Patient's most recent lab result is older than the maximum 21 days allowed.).   Wt Readings from Last 3 Encounters:  06/03/20 278 lb 3.2 oz (126.2 kg)  05/04/20 280 lb (127 kg)  10/30/19 278 lb 3.2 oz (126.2 kg)     Other studies reviewed: Additional studies/records reviewed today include: summarized above  ASSESSMENT AND PLAN:  1. Paroxysmal AFib     CHA2DS2Vasc is 3, on pradaxa, no recent labs for review     Labs today  asymptomatic He missed all of his PM medicine Friday evening including his pradaxa This is very unusual for him, and no missed doses since  recommendation TEE/DCCV given rates 110's-120's Discussed that should  he have recurrent AFlutter or fib post DCCV early or recurrently may need to consider another AAD BMI is 38, not likely a good ablation candidate  He is asymptomatic and BP is OK, with baseline bradycardia will not increase his diltiazem We discussed TEE/DCCV procedures potential risks and benefits, he is agreeable to proceed Reviewed plan with Dr. Tamala Julian Will plan follow up with Dr. Rayann Heman in a month or so, post DCCV   2. HTN     No change today, follow  3. HLD     Not addressed today    Disposition: F/u as above   Current medicines are reviewed at length with the patient today.  The patient did not have any concerns regarding medicines.  Venetia Night, PA-C 06/03/2020 5:38 PM     [place] [id number] [phone] (office)  [phone] (fax)

## 2020-06-04 LAB — BASIC METABOLIC PANEL
BUN/Creatinine Ratio: 16 (ref 10–24)
BUN: 15 mg/dL (ref 8–27)
CO2: 21 mmol/L (ref 20–29)
Calcium: 9.4 mg/dL (ref 8.6–10.2)
Chloride: 98 mmol/L (ref 96–106)
Creatinine, Ser: 0.95 mg/dL (ref 0.76–1.27)
Glucose: 106 mg/dL — ABNORMAL HIGH (ref 65–99)
Potassium: 4.1 mmol/L (ref 3.5–5.2)
Sodium: 136 mmol/L (ref 134–144)
eGFR: 85 mL/min/{1.73_m2} (ref >59)

## 2020-06-04 LAB — CBC
Hematocrit: 37.4 % — ABNORMAL LOW (ref 37.5–51.0)
Hemoglobin: 12.1 g/dL — ABNORMAL LOW (ref 13.0–17.7)
MCH: 26.2 pg — ABNORMAL LOW (ref 26.6–33.0)
MCHC: 32.4 g/dL (ref 31.5–35.7)
MCV: 81 fL (ref 79–97)
Platelets: 265 10*3/uL (ref 150–450)
RBC: 4.62 x10E6/uL (ref 4.14–5.80)
RDW: 14.6 % (ref 11.6–15.4)
WBC: 7.7 10*3/uL (ref 3.4–10.8)

## 2020-06-05 ENCOUNTER — Other Ambulatory Visit (HOSPITAL_COMMUNITY): Payer: Medicare Other

## 2020-06-08 ENCOUNTER — Other Ambulatory Visit (HOSPITAL_COMMUNITY)
Admission: RE | Admit: 2020-06-08 | Discharge: 2020-06-08 | Disposition: A | Discharge status: Home / Self Care | Payer: Medicare Other | Source: Ambulatory Visit | Attending: Internal Medicine | Admitting: Internal Medicine

## 2020-06-08 DIAGNOSIS — Z01812 Encounter for preprocedural laboratory examination: Secondary | ICD-10-CM | POA: Insufficient documentation

## 2020-06-08 DIAGNOSIS — Z20822 Contact with and (suspected) exposure to covid-19: Secondary | ICD-10-CM | POA: Insufficient documentation

## 2020-06-08 LAB — SARS CORONAVIRUS 2 (TAT 6-24 HRS): SARS Coronavirus 2: NEGATIVE

## 2020-06-10 ENCOUNTER — Encounter (HOSPITAL_COMMUNITY)
Admission: RE | Disposition: A | Discharge status: Home / Self Care | Payer: Self-pay | Source: Home / Self Care | Attending: Internal Medicine

## 2020-06-10 ENCOUNTER — Encounter (HOSPITAL_COMMUNITY): Payer: Self-pay | Admitting: Internal Medicine

## 2020-06-10 ENCOUNTER — Ambulatory Visit (HOSPITAL_BASED_OUTPATIENT_CLINIC_OR_DEPARTMENT_OTHER)
Admission: RE | Admit: 2020-06-10 | Discharge: 2020-06-10 | Disposition: A | Discharge status: Home / Self Care | Payer: Medicare Other | Source: Ambulatory Visit | Attending: Physician Assistant | Admitting: Physician Assistant

## 2020-06-10 ENCOUNTER — Ambulatory Visit (HOSPITAL_COMMUNITY)
Admission: RE | Admit: 2020-06-10 | Discharge: 2020-06-10 | Disposition: A | Discharge status: Home / Self Care | Payer: Medicare Other | Attending: Internal Medicine | Admitting: Internal Medicine

## 2020-06-10 ENCOUNTER — Other Ambulatory Visit: Payer: Self-pay

## 2020-06-10 ENCOUNTER — Ambulatory Visit (HOSPITAL_COMMUNITY): Payer: Medicare Other | Admitting: Certified Registered Nurse Anesthetist

## 2020-06-10 DIAGNOSIS — Z6838 Body mass index (BMI) 38.0-38.9, adult: Secondary | ICD-10-CM | POA: Diagnosis not present

## 2020-06-10 DIAGNOSIS — G4733 Obstructive sleep apnea (adult) (pediatric): Secondary | ICD-10-CM | POA: Diagnosis not present

## 2020-06-10 DIAGNOSIS — Z880 Allergy status to penicillin: Secondary | ICD-10-CM | POA: Diagnosis not present

## 2020-06-10 DIAGNOSIS — E785 Hyperlipidemia, unspecified: Secondary | ICD-10-CM | POA: Diagnosis not present

## 2020-06-10 DIAGNOSIS — Z888 Allergy status to other drugs, medicaments and biological substances status: Secondary | ICD-10-CM | POA: Diagnosis not present

## 2020-06-10 DIAGNOSIS — I4891 Unspecified atrial fibrillation: Secondary | ICD-10-CM | POA: Diagnosis not present

## 2020-06-10 DIAGNOSIS — Z882 Allergy status to sulfonamides status: Secondary | ICD-10-CM | POA: Diagnosis not present

## 2020-06-10 DIAGNOSIS — Z79899 Other long term (current) drug therapy: Secondary | ICD-10-CM | POA: Insufficient documentation

## 2020-06-10 DIAGNOSIS — R000 Tachycardia, unspecified: Secondary | ICD-10-CM | POA: Diagnosis not present

## 2020-06-10 DIAGNOSIS — I10 Essential (primary) hypertension: Secondary | ICD-10-CM | POA: Diagnosis not present

## 2020-06-10 DIAGNOSIS — I480 Paroxysmal atrial fibrillation: Secondary | ICD-10-CM | POA: Diagnosis not present

## 2020-06-10 DIAGNOSIS — I4819 Other persistent atrial fibrillation: Secondary | ICD-10-CM

## 2020-06-10 HISTORY — PX: TEE WITHOUT CARDIOVERSION: SHX5443

## 2020-06-10 HISTORY — PX: CARDIOVERSION: SHX1299

## 2020-06-10 SURGERY — ECHOCARDIOGRAM, TRANSESOPHAGEAL
Anesthesia: General

## 2020-06-10 MED ORDER — BUTAMBEN-TETRACAINE-BENZOCAINE 2-2-14 % EX AERO
INHALATION_SPRAY | CUTANEOUS | Status: DC | PRN
  Administered 2020-06-10: 2 via TOPICAL

## 2020-06-10 MED ORDER — SODIUM CHLORIDE 0.9 % IV SOLN: INTRAVENOUS | Status: DC

## 2020-06-10 MED ORDER — PROPOFOL 10 MG/ML IV BOLUS
INTRAVENOUS | Status: DC | PRN
  Administered 2020-06-10 (×2): 30 mg via INTRAVENOUS

## 2020-06-10 MED ORDER — PROPOFOL 500 MG/50ML IV EMUL
INTRAVENOUS | Status: DC | PRN
  Administered 2020-06-10: 100 ug/kg/min via INTRAVENOUS

## 2020-06-10 MED ORDER — LIDOCAINE HCL (CARDIAC) PF 100 MG/5ML IV SOSY
PREFILLED_SYRINGE | INTRAVENOUS | Status: DC | PRN
  Administered 2020-06-10: 20 mg via INTRAVENOUS

## 2020-06-10 NOTE — Progress Notes (Signed)
  Echocardiogram Echocardiogram Transesophageal has been performed.  Brian Myers 06/10/2020, 11:26 AM

## 2020-06-10 NOTE — Interval H&P Note (Signed)
History and Physical Interval Note:  06/10/2020 10:39 AM  Brian Myers  has presented today for surgery, with the diagnosis of TACHYCARDIA.  The various methods of treatment have been discussed with the patient and family. After consideration of risks, benefits and other options for treatment, the patient has consented to  Procedure(s): TRANSESOPHAGEAL ECHOCARDIOGRAM (TEE) (N/A) CARDIOVERSION (N/A) as a surgical intervention.  The patient's history has been reviewed, patient examined, no change in status, stable for surgery.  I have reviewed the patient's chart and labs.  Questions were answered to the patient's satisfaction.     Elouise Munroe

## 2020-06-10 NOTE — Transfer of Care (Signed)
Immediate Anesthesia Transfer of Care Note  Patient: Brian Myers  Procedure(s) Performed: TRANSESOPHAGEAL ECHOCARDIOGRAM (TEE) (N/A ) CARDIOVERSION (N/A )  Patient Location: Endoscopy Unit  Anesthesia Type:MAC  Level of Consciousness: awake and alert   Airway & Oxygen Therapy: Patient Spontanous Breathing  Post-op Assessment: Report given to RN and Post -op Vital signs reviewed and stable  Post vital signs: Reviewed and stable  Last Vitals:  Vitals Value Taken Time  BP 133/78 06/10/20 1124  Temp    Pulse 80 06/10/20 1124  Resp 22 06/10/20 1124  SpO2 95 % 06/10/20 1124  Vitals shown include unvalidated device data.  Last Pain:  Vitals:   06/10/20 1010  TempSrc: Oral  PainSc: 0-No pain         Complications: No complications documented.

## 2020-06-10 NOTE — CV Procedure (Signed)
INDICATIONS: R/o LAA thrombus pre cardioversion  PROCEDURE:   Informed consent was obtained prior to the procedure. The risks, benefits and alternatives for the procedure were discussed and the patient comprehended these risks.  Risks include, but are not limited to, cough, sore throat, vomiting, nausea, somnolence, esophageal and stomach trauma or perforation, bleeding, low blood pressure, aspiration, pneumonia, infection, trauma to the teeth and death.    After a procedural time-out, the oropharynx was anesthetized with 20% benzocaine spray.   During this procedure the patient was administered propofol per anesthesia.  The patient's heart rate, blood pressure, and oxygen saturation were monitored continuously during the procedure. The period of conscious sedation was 30 minutes, of which I was present face-to-face 100% of this time.  The transesophageal probe was inserted in the esophagus and stomach without difficulty and multiple views were obtained.  The patient was kept under observation until the patient left the procedure room.  The patient left the procedure room in stable condition.   Agitated microbubble saline contrast was not administered.  COMPLICATIONS:    There were no immediate complications.  FINDINGS:   FORMAL ECHOCARDIOGRAM REPORT PENDING Limited study due to patient unable to be safely sedated completely No LA appendage thrombus. No LV apical thrombus.  LVEF 55%  RECOMMENDATIONS:     Proceed to cardioversion   Procedure: Electrical Cardioversion Indications:  Atrial Fibrillation  Procedure Details:  Consent: Risks of procedure as well as the alternatives and risks of each were explained to the (patient/caregiver).  Consent for procedure obtained.  Time Out: Verified patient identification, verified procedure, site/side was marked, verified correct patient position, special equipment/implants available, medications/allergies/relevent history reviewed,  required imaging and test results available. PERFORMED.  Patient placed on cardiac monitor, pulse oximetry, supplemental oxygen as necessary.  Sedation given: propofol per anesthesia Pacer pads placed anterior and posterior chest.  Cardioverted 1 time(s).  Cardioversion with synchronized biphasic 200J shock.  Evaluation: Findings: Post procedure EKG shows: NSR Complications: None Patient did tolerate procedure well.  Time Spent Directly with the Patient:  60 minutes   Elouise Munroe 06/10/2020, 11:13 AM

## 2020-06-10 NOTE — Anesthesia Preprocedure Evaluation (Signed)
Anesthesia Evaluation  Patient identified by MRN, date of birth, ID band Patient awake    Reviewed: Allergy & Precautions, NPO status , Patient's Chart, lab work & pertinent test results  Airway Mallampati: II  TM Distance: >3 FB Neck ROM: Full    Dental  (+) Teeth Intact   Pulmonary asthma , sleep apnea and Continuous Positive Airway Pressure Ventilation ,    breath sounds clear to auscultation       Cardiovascular hypertension, + dysrhythmias Atrial Fibrillation  Rhythm:Irregular Rate:Normal     Neuro/Psych negative neurological ROS  negative psych ROS   GI/Hepatic Neg liver ROS, hiatal hernia, GERD  ,  Endo/Other  negative endocrine ROS  Renal/GU negative Renal ROS     Musculoskeletal  (+) Arthritis ,   Abdominal (+) + obese,   Peds  Hematology negative hematology ROS (+)   Anesthesia Other Findings - HLD  Reproductive/Obstetrics                             Anesthesia Physical Anesthesia Plan  ASA: III  Anesthesia Plan: General   Post-op Pain Management:    Induction: Intravenous  PONV Risk Score and Plan: 0  Airway Management Planned:   Additional Equipment: None  Intra-op Plan:   Post-operative Plan:   Informed Consent: I have reviewed the patients History and Physical, chart, labs and discussed the procedure including the risks, benefits and alternatives for the proposed anesthesia with the patient or authorized representative who has indicated his/her understanding and acceptance.       Plan Discussed with: CRNA  Anesthesia Plan Comments: (Echo:  - Left ventricle: The cavity size was normal. Wall thickness was  normal. Systolic function was normal. The estimated ejection  fraction was in the range of 60% to 65%. Wall motion was normal;  there were no regional wall motion abnormalities. Features are  consistent with a pseudonormal left ventricular  filling pattern,  with concomitant abnormal relaxation and increased filling  pressure (grade 2 diastolic dysfunction).  - Right atrium: The atrium was mildly dilated.)        Anesthesia Quick Evaluation

## 2020-06-10 NOTE — Anesthesia Procedure Notes (Signed)
Procedure Name: MAC Date/Time: 06/10/2020 10:46 AM Performed by: Reece Agar, CRNA Pre-anesthesia Checklist: Patient identified, Emergency Drugs available, Suction available, Patient being monitored and Timeout performed Patient Re-evaluated:Patient Re-evaluated prior to induction Oxygen Delivery Method: Nasal cannula

## 2020-06-10 NOTE — Discharge Instructions (Signed)
Transesophageal Echocardiogram Transesophageal echocardiogram (TEE) is a test that uses sound waves to take pictures of your heart. TEE is done by passing a small probe attached to a flexible tube down the part of the body that moves food from your mouth to your stomach (esophagus). The pictures give clear images of your heart. This can help your doctor see if there are problems with your heart. Tell a doctor about:  Any allergies you have.  All medicines you are taking. This includes vitamins, herbs, eye drops, creams, and over-the-counter medicines.  Any problems you or family members have had with anesthetic medicines.  Any blood disorders you have.  Any surgeries you have had.  Any medical conditions you have.  Any swallowing problems.  Whether you have or have had a blockage in the part of the body that moves food from your mouth to your stomach.  Whether you are pregnant or may be pregnant. What are the risks? In general, this is a safe procedure. But, problems may occur, such as:  Damage to nearby structures or organs.  A tear in the part of the body that moves food from your mouth to your stomach.  Irregular heartbeat.  Hoarse voice or trouble swallowing.  Bleeding. What happens before the procedure? Medicines  Ask your doctor about changing or stopping: ? Your normal medicines. ? Vitamins, herbs, and supplements. ? Over-the-counter medicines.  Do not take aspirin or ibuprofen unless you are told to. General instructions  Follow instructions from your doctor about what you cannot eat or drink.  You will take out any dentures or dental retainers.  Plan to have a responsible adult take you home from the hospital or clinic.  Plan to have a responsible adult care for you for the time you are told after you leave the hospital or clinic. This is important. What happens during the procedure?  An IV will be put into one of your veins.  You may be given: ? A  sedative. This medicine helps you relax. ? A medicine to numb the back of your throat. This may be sprayed or gargled.  Your blood pressure, heart rate, and breathing will be watched.  You may be asked to lie on your left side.  A bite block will be placed in your mouth. This keeps you from biting the tube.  The tip of the probe will be placed into the back of your mouth.  You will be asked to swallow.  Your doctor will take pictures of your heart.  The probe and bite block will be taken out after the test is done. The procedure may vary among doctors and hospitals.   What can I expect after the procedure?  You will be monitored until you leave the hospital or clinic. This includes checking your blood pressure, heart rate, breathing rate, and blood oxygen level.  Your throat may feel sore and numb. This will get better over time. You will not be allowed to eat or drink until the numbness has gone away.  It is common to have a sore throat for a day or two.  It is up to you to get the results of your procedure. Ask how to get your results when they are ready. Follow these instructions at home:  If you were given a sedative during your procedure, do not drive or use machines until your doctor says that it is safe.  Return to your normal activities when your doctor says that it is safe.    Keep all follow-up visits. Summary  TEE is a test that uses sound waves to take pictures of your heart.  You will be given a medicine to help you relax.  Do not drive or use machines until your doctor says that it is safe. This information is not intended to replace advice given to you by your health care provider. Make sure you discuss any questions you have with your health care provider. Document Revised: 10/08/2019 Document Reviewed: 10/08/2019 Elsevier Patient Education  2021 [place].   Hospital doctor cardioversion is the delivery of a jolt of electricity to  restore a normal rhythm to the heart. A rhythm that is too fast or is not regular keeps the heart from pumping well. In this procedure, sticky patches or metal paddles are placed on the chest to deliver electricity to the heart from a device. This procedure may be done in an emergency if:  There is low or no blood pressure as a result of the heart rhythm.  Normal rhythm must be restored as fast as possible to protect the brain and heart from further damage.  It may save a life. This may also be a scheduled procedure for irregular or fast heart rhythms that are not immediately life-threatening. Tell a health care provider about:  Any allergies you have.  All medicines you are taking, including vitamins, herbs, eye drops, creams, and over-the-counter medicines.  Any problems you or family members have had with anesthetic medicines.  Any blood disorders you have.  Any surgeries you have had.  Any medical conditions you have.  Whether you are pregnant or may be pregnant. What are the risks? Generally, this is a safe procedure. However, problems may occur, including:  Allergic reactions to medicines.  A blood clot that breaks free and travels to other parts of your body.  The possible return of an abnormal heart rhythm within hours or days after the procedure.  Your heart stopping (cardiac arrest). This is rare. What happens before the procedure? Medicines  Your health care provider may have you start taking: ? Blood-thinning medicines (anticoagulants) so your blood does not clot as easily. ? Medicines to help stabilize your heart rate and rhythm.  Ask your health care provider about: ? Changing or stopping your regular medicines. This is especially important if you are taking diabetes medicines or blood thinners. ? Taking medicines such as aspirin and ibuprofen. These medicines can thin your blood. Do not take these medicines unless your health care provider tells you to take  them. ? Taking over-the-counter medicines, vitamins, herbs, and supplements. General instructions  Follow instructions from your health care provider about eating or drinking restrictions.  Plan to have someone take you home from the hospital or clinic.  If you will be going home right after the procedure, plan to have someone with you for 24 hours.  Ask your health care provider what steps will be taken to help prevent infection. These may include washing your skin with a germ-killing soap. What happens during the procedure?  An IV will be inserted into one of your veins.  Sticky patches (electrodes) or metal paddles may be placed on your chest.  You will be given a medicine to help you relax (sedative).  An electrical shock will be delivered. The procedure may vary among health care providers and hospitals.   What can I expect after the procedure?  Your blood pressure, heart rate, breathing rate, and blood oxygen level will be monitored until  you leave the hospital or clinic.  Your heart rhythm will be watched to make sure it does not change.  You may have some redness on the skin where the shocks were given. Follow these instructions at home:  Do not drive for 24 hours if you were given a sedative during your procedure.  Take over-the-counter and prescription medicines only as told by your health care provider.  Ask your health care provider how to check your pulse. Check it often.  Rest for 48 hours after the procedure or as told by your health care provider.  Avoid or limit your caffeine use as told by your health care provider.  Keep all follow-up visits as told by your health care provider. This is important. Contact a health care provider if:  You feel like your heart is beating too quickly or your pulse is not regular.  You have a serious muscle cramp that does not go away. Get help right away if:  You have discomfort in your chest.  You are dizzy or you  feel faint.  You have trouble breathing or you are short of breath.  Your speech is slurred.  You have trouble moving an arm or leg on one side of your body.  Your fingers or toes turn cold or blue. Summary  Electrical cardioversion is the delivery of a jolt of electricity to restore a normal rhythm to the heart.  This procedure may be done right away in an emergency or may be a scheduled procedure if the condition is not an emergency.  Generally, this is a safe procedure.  After the procedure, check your pulse often as told by your health care provider. This information is not intended to replace advice given to you by your health care provider. Make sure you discuss any questions you have with your health care provider. Document Revised: 09/17/2018 Document Reviewed: 09/17/2018 Elsevier Patient Education  [place].

## 2020-06-11 ENCOUNTER — Encounter (HOSPITAL_COMMUNITY): Payer: Self-pay | Admitting: Internal Medicine

## 2020-06-11 NOTE — Anesthesia Postprocedure Evaluation (Signed)
Anesthesia Post Note  Patient: Brian Myers  Procedure(s) Performed: TRANSESOPHAGEAL ECHOCARDIOGRAM (TEE) (N/A ) CARDIOVERSION (N/A )     Patient location during evaluation: PACU Anesthesia Type: General Level of consciousness: awake and alert Pain management: pain level controlled Vital Signs Assessment: post-procedure vital signs reviewed and stable Respiratory status: spontaneous breathing, nonlabored ventilation, respiratory function stable and patient connected to nasal cannula oxygen Cardiovascular status: blood pressure returned to baseline and stable Postop Assessment: no apparent nausea or vomiting Anesthetic complications: no   No complications documented.  Last Vitals:  Vitals:   06/10/20 1133 06/10/20 1140  BP: 134/80 130/82  Pulse: 79 79  Resp: 16 (!) 21  Temp:    SpO2: 97% 96%    Last Pain:  Vitals:   06/11/20 0847  TempSrc:   PainSc: 0-No pain                 Effie Berkshire

## 2020-06-22 ENCOUNTER — Other Ambulatory Visit: Payer: Self-pay | Admitting: *Deleted

## 2020-06-22 MED ORDER — DILTIAZEM HCL ER COATED BEADS 240 MG PO CP24: 240.0000 mg | ORAL_CAPSULE | Freq: Every day | ORAL | 3 refills | Status: DC

## 2020-06-23 DIAGNOSIS — Z23 Encounter for immunization: Secondary | ICD-10-CM | POA: Diagnosis not present

## 2020-07-08 ENCOUNTER — Other Ambulatory Visit: Payer: Self-pay | Admitting: Interventional Cardiology

## 2020-07-13 ENCOUNTER — Telehealth: Payer: Self-pay

## 2020-07-13 ENCOUNTER — Other Ambulatory Visit: Payer: Self-pay

## 2020-07-13 ENCOUNTER — Encounter: Payer: Self-pay | Admitting: Internal Medicine

## 2020-07-13 ENCOUNTER — Telehealth: Payer: Self-pay | Admitting: *Deleted

## 2020-07-13 ENCOUNTER — Ambulatory Visit (INDEPENDENT_AMBULATORY_CARE_PROVIDER_SITE_OTHER): Payer: Medicare Other | Admitting: Internal Medicine

## 2020-07-13 VITALS — Ht 71.0 in | Wt 275.0 lb

## 2020-07-13 DIAGNOSIS — I480 Paroxysmal atrial fibrillation: Secondary | ICD-10-CM | POA: Diagnosis not present

## 2020-07-13 DIAGNOSIS — I484 Atypical atrial flutter: Secondary | ICD-10-CM

## 2020-07-13 DIAGNOSIS — G4733 Obstructive sleep apnea (adult) (pediatric): Secondary | ICD-10-CM

## 2020-07-13 DIAGNOSIS — F1010 Alcohol abuse, uncomplicated: Secondary | ICD-10-CM | POA: Diagnosis not present

## 2020-07-13 DIAGNOSIS — Z9989 Dependence on other enabling machines and devices: Secondary | ICD-10-CM

## 2020-07-13 NOTE — Telephone Encounter (Signed)
Spoke with pt regarding appt on 07/13/20. Pt gave verbal consent for a MyChart visit.

## 2020-07-13 NOTE — Progress Notes (Signed)
Electrophysiology TeleHealth Note   Due to national recommendations of social distancing due to COVID 19, an audio/video telehealth visit is felt to be most appropriate for this patient at this time.  See MyChart message from today for the patient's consent to telehealth for Brian Myers.  Date:  07/13/2020   ID:  Brian Myers, DOB [birth year], MRN [id number]  Location: patient's home  Provider location:  [place]  Evaluation Performed: Follow-up visit  PCP:  Leighton Ruff, MD   Electrophysiologist:  Dr Rayann Heman  Chief Complaint:  palpitations  History of Present Illness:    Brian Myers is a 75 y.o. male who presents via telehealth conferencing today.  Since last being seen in our clinic, the patient reports doing reasonably well.  He has returned to afib after his recent cardioversion.  He has symptoms of palpitations and fatigue.  Today, he denies symptoms of palpitations, chest pain, shortness of breath,  lower extremity edema, dizziness, presyncope, or syncope.  His primary concern is with backpain.  The patient is otherwise without complaint today.   Past Medical History:  Diagnosis Date  . Arthritis    KNEES  . Asthma   . BPH (benign prostatic hypertrophy)    DIFFICULTY GETTING STREAM STARTED  . Eczema    HANDS  . GERD (gastroesophageal reflux disease)   . H/O hiatal hernia   . Hyperlipidemia   . Hyperlipidemia    DOES NOT TOLERATE ANY OF THE STATINS  . Persistent atrial fibrillation (HCC)    Dr Tamala Julian  . Pneumonia    LAST EPISODE JAN 2015  . Right knee meniscal tear    PAIN RT KNEE  . Sleep apnea    with cpap    Past Surgical History:  Procedure Laterality Date  . CARDIOVERSION N/A 06/10/2020   Procedure: CARDIOVERSION;  Surgeon: Elouise Munroe, MD;  Location: [facility] ENDOSCOPY;  Service: Cardiovascular;  Laterality: N/A;  . [place]  . KNEE ARTHROSCOPY Right 04/17/2013   Procedure: ARTHROSCOPY RIGHT KNEE WITH MEDIAL AND  LATERAL DEBRIDEMENT AND CONDRAPLASTY;  Surgeon: Gearlean Alf, MD;  Location: [facility] ORS;  Service: Orthopedics;  Laterality: Right;  . LEFT KNEE ARTHROSCPY  2009  . TEE WITHOUT CARDIOVERSION N/A 06/10/2020   Procedure: TRANSESOPHAGEAL ECHOCARDIOGRAM (TEE);  Surgeon: Elouise Munroe, MD;  Location: [facility] ENDOSCOPY;  Service: Cardiovascular;  Laterality: N/A;    Current Outpatient Medications  Medication Sig Dispense Refill  . albuterol (PROVENTIL HFA;VENTOLIN HFA) 108 (90 BASE) MCG/ACT inhaler Inhale 2 puffs into the lungs every 6 (six) hours as needed for wheezing or shortness of breath.    . dabigatran (PRADAXA) 150 MG CAPS capsule TAKE 1 CAPSULE BY MOUTH EVERY 12 HOURS 180 capsule 3  . diltiazem (CARDIZEM CD) 240 MG 24 hr capsule Take 1 capsule (240 mg total) by mouth daily. 90 capsule 3  . esomeprazole (NEXIUM) 40 MG capsule Take 40 mg by mouth daily.  1  . flecainide (TAMBOCOR) 100 MG tablet TAKE 1 TABLET BY MOUTH TWICE A DAY 180 tablet 2  . fluticasone (FLONASE) 50 MCG/ACT nasal spray Place 1 spray into both nostrils 2 (two) times a day.    . losartan (COZAAR) 50 MG tablet TAKE 1 TABLET BY MOUTH EVERY DAY 90 tablet 2  . methylcellulose (ARTIFICIAL TEARS) 1 % ophthalmic solution Place 1 drop into both eyes 2 (two) times daily as needed (dry eyes).    . SYMBICORT 160-4.5 MCG/ACT inhaler SMARTSIG:2 Puff(s) By Mouth Twice  Daily     No current facility-administered medications for this visit.    Allergies:   Penicillins, Sulfa drugs cross reactors, and Statins   Social History:  The patient  reports that he has never smoked. He has never used smokeless tobacco. He reports current alcohol use. He reports that he does not use drugs.   ROS:  Please see the history of present illness.   All other systems are personally reviewed and negative.    Exam:    Vital Signs:  Ht 5\' 11"  (1.803 m)   Wt 275 lb (124.7 kg)   BMI 38.35 kg/m   Well sounding and appearing, alert and conversant, regular  work of breathing,  good skin color Eyes- anicteric, neuro- grossly intact, skin- no apparent rash or lesions or cyanosis, mouth- oral mucosa is pink  Labs/Other Tests and Data Reviewed:    Recent Labs: 06/03/2020: BUN 15; Creatinine, Ser 0.95; Hemoglobin 12.1; Platelets 265; Potassium 4.1; Sodium 136   Wt Readings from Last 3 Encounters:  07/13/20 275 lb (124.7 kg)  06/10/20 275 lb (124.7 kg)  06/03/20 278 lb 3.2 oz (126.2 kg)    Kardia[employer] strip from today reveals afib  TEE 06/10/20- EF 55%, mild atrial enlargement  ASSESSMENT & PLAN:    1.  Persistent atrial fibrillation/ atrial flutter S/p recent cardioversion but has already returned to afib The patient has symptomatic, recurrent atrial fibrillation. he has failed medical therapy with flecainide Chads2vasc score is 2.  he is anticoagulated with pradaxa . Therapeutic strategies for afib including medicine (tikosyn and amiodarone) and ablation were discussed in detail with the patient today. Risk, benefits, and alternatives to EP study and radiofrequency ablation for afib were also discussed in detail today.  At this time, he is leaning towards tikosyn.  He would like to think about this a few days and then decide.  We will stop flecainide today.  2. mobid obesity Body mass index is 38.35 kg/m. Lifestyle modification is advised  3. OSA Compliance with cPAP encouraged  4. ETOH Avoidance encouraged  Risks, benefits and potential toxicities for medications prescribed and/or refilled reviewed with patient today.   Follow-up:  AF clinic in 3 months   Patient Risk:  after full review of this patients clinical status, I feel that they are at moderate risk at this time.  Today, I have spent 15 minutes with the patient with telehealth technology discussing arrhythmia management .    SignedThompson Grayer, MD  07/13/2020 2:17 PM     [place] [address] [phone]  (office) [phone] (fax)

## 2020-07-13 NOTE — Telephone Encounter (Signed)
Discussed with the patient about Dofetilide admission and why it requires a hospital stay to determine the dose that is safe for him along with details regarding continuous monitoring and why. Patient requested some education be sent over my chart.

## 2020-07-13 NOTE — Telephone Encounter (Signed)
-----   Message from Thompson Grayer, MD sent at 07/13/2020  2:26 PM EDT ----- Stop flecainide (I have advised him to stop it).  Please call him in 2 days to see if he has decided to pursue tikosyn admit

## 2020-07-15 DIAGNOSIS — N400 Enlarged prostate without lower urinary tract symptoms: Secondary | ICD-10-CM | POA: Diagnosis not present

## 2020-07-15 DIAGNOSIS — J45909 Unspecified asthma, uncomplicated: Secondary | ICD-10-CM | POA: Diagnosis not present

## 2020-07-15 DIAGNOSIS — E7800 Pure hypercholesterolemia, unspecified: Secondary | ICD-10-CM | POA: Diagnosis not present

## 2020-07-15 DIAGNOSIS — K219 Gastro-esophageal reflux disease without esophagitis: Secondary | ICD-10-CM | POA: Diagnosis not present

## 2020-07-15 DIAGNOSIS — I10 Essential (primary) hypertension: Secondary | ICD-10-CM | POA: Diagnosis not present

## 2020-07-15 DIAGNOSIS — I4891 Unspecified atrial fibrillation: Secondary | ICD-10-CM | POA: Diagnosis not present

## 2020-07-15 DIAGNOSIS — D649 Anemia, unspecified: Secondary | ICD-10-CM | POA: Diagnosis not present

## 2020-07-15 NOTE — Telephone Encounter (Signed)
Patient wishes to proceed with [place] admission. Will notifiy the afib clinic and sent paperwork to the patient over mychart.  Verbalized understanding.

## 2020-07-16 ENCOUNTER — Telehealth: Payer: Self-pay | Admitting: Pharmacist

## 2020-07-16 ENCOUNTER — Encounter (HOSPITAL_COMMUNITY): Payer: Self-pay

## 2020-07-16 NOTE — Telephone Encounter (Signed)
Medication list reviewed in anticipation of upcoming Tikosyn initiation. Patient is not taking any contraindicated medications. He is on one QTc prolonging medications, Symbcort. This is an intermediate QTc risk. May remain on therapy but QTc should be monitored closely.   Patient is anticoagulated on Pradaxa on the appropriate dose. Please ensure that patient has not missed any anticoagulation doses in the 3 weeks prior to Tikosyn initiation.   Patient will need to be counseled to avoid use of Benadryl while on Tikosyn and in the 2-3 days prior to Tikosyn initiation.

## 2020-07-21 ENCOUNTER — Telehealth (HOSPITAL_COMMUNITY): Payer: Self-pay | Admitting: *Deleted

## 2020-07-21 NOTE — Telephone Encounter (Signed)
Left message to schedule tikosyn admission

## 2020-07-31 NOTE — Telephone Encounter (Signed)
Left message to set up tikosyn admission

## 2020-08-07 ENCOUNTER — Telehealth (HOSPITAL_COMMUNITY): Payer: Self-pay | Admitting: *Deleted

## 2020-08-07 ENCOUNTER — Other Ambulatory Visit (HOSPITAL_COMMUNITY): Payer: Medicare Other

## 2020-08-07 NOTE — Telephone Encounter (Signed)
Patient called today stating he has decided to cancel his tikosyn admission. Since stopping flecainide his episodes of afib have subsided for over 2 weeks so he would like to "see how it goes off medication." If his afib should increase he will call back to re-discuss next steps. Admission canceled. Will notify Dr. Rayann Heman of patients decision regarding tikosyn.

## 2020-08-10 ENCOUNTER — Ambulatory Visit (HOSPITAL_COMMUNITY): Payer: Medicare Other | Admitting: Physician Assistant

## 2020-08-10 ENCOUNTER — Inpatient Hospital Stay: Admission: RE | Admit: 2020-08-10 | Payer: Medicare Other | Source: Ambulatory Visit | Admitting: Cardiology

## 2020-08-21 DIAGNOSIS — K219 Gastro-esophageal reflux disease without esophagitis: Secondary | ICD-10-CM | POA: Diagnosis not present

## 2020-08-21 DIAGNOSIS — J45909 Unspecified asthma, uncomplicated: Secondary | ICD-10-CM | POA: Diagnosis not present

## 2020-08-21 DIAGNOSIS — I10 Essential (primary) hypertension: Secondary | ICD-10-CM | POA: Diagnosis not present

## 2020-08-21 DIAGNOSIS — N400 Enlarged prostate without lower urinary tract symptoms: Secondary | ICD-10-CM | POA: Diagnosis not present

## 2020-08-21 DIAGNOSIS — I4891 Unspecified atrial fibrillation: Secondary | ICD-10-CM | POA: Diagnosis not present

## 2020-08-21 DIAGNOSIS — E7800 Pure hypercholesterolemia, unspecified: Secondary | ICD-10-CM | POA: Diagnosis not present

## 2020-08-21 DIAGNOSIS — D649 Anemia, unspecified: Secondary | ICD-10-CM | POA: Diagnosis not present

## 2020-09-16 ENCOUNTER — Other Ambulatory Visit: Payer: Self-pay | Admitting: Interventional Cardiology

## 2020-10-02 DIAGNOSIS — J45909 Unspecified asthma, uncomplicated: Secondary | ICD-10-CM | POA: Diagnosis not present

## 2020-10-02 DIAGNOSIS — J329 Chronic sinusitis, unspecified: Secondary | ICD-10-CM | POA: Diagnosis not present

## 2020-10-02 DIAGNOSIS — Z6838 Body mass index (BMI) 38.0-38.9, adult: Secondary | ICD-10-CM | POA: Diagnosis not present

## 2020-10-14 ENCOUNTER — Ambulatory Visit (INDEPENDENT_AMBULATORY_CARE_PROVIDER_SITE_OTHER): Payer: Medicare Other | Admitting: Internal Medicine

## 2020-10-14 ENCOUNTER — Other Ambulatory Visit: Payer: Self-pay

## 2020-10-14 VITALS — BP 134/70 | HR 87 | Ht 71.0 in | Wt 276.0 lb

## 2020-10-14 DIAGNOSIS — F1010 Alcohol abuse, uncomplicated: Secondary | ICD-10-CM

## 2020-10-14 DIAGNOSIS — G4733 Obstructive sleep apnea (adult) (pediatric): Secondary | ICD-10-CM | POA: Diagnosis not present

## 2020-10-14 DIAGNOSIS — Z9989 Dependence on other enabling machines and devices: Secondary | ICD-10-CM

## 2020-10-14 DIAGNOSIS — I480 Paroxysmal atrial fibrillation: Secondary | ICD-10-CM

## 2020-10-14 DIAGNOSIS — I484 Atypical atrial flutter: Secondary | ICD-10-CM | POA: Diagnosis not present

## 2020-10-14 NOTE — Patient Instructions (Addendum)
Medication Instructions:  Your physician recommends that you continue on your current medications as directed. Please refer to the Current Medication list given to you today.  Labwork: None ordered.  Testing/Procedures: Please schedule Echo, then a few days later [place]. Your physician has requested that you have an echocardiogram. Echocardiography is a painless test that uses sound waves to create images of your heart. It provides your doctor with information about the size and shape of your heart and how well your heart's chambers and valves are working. This procedure takes approximately one hour. There are no restrictions for this procedure.   Follow-Up: Your physician wants you to follow-up in: 4 months with Dr. Rayann Heman.    Any Other Special Instructions Will Be Listed Below (If Applicable).  If you need a refill on your cardiac medications before your next appointment, please call your pharmacy.

## 2020-10-14 NOTE — Progress Notes (Signed)
PCP: Leighton Ruff, MD Primary Cardiologist: Dr Tamala Julian Primary EP: Dr Rayann Heman  Brian Myers is a 75 y.o. male who presents today for routine electrophysiology followup.  Since last being seen in our clinic, the patient reports doing very well.   He is tolerating his afib well.  He decided to not pursue sinus rhythm. Today, he denies symptoms of palpitations, chest pain, shortness of breath,  lower extremity edema, dizziness, presyncope, or syncope.  The patient is otherwise without complaint today.   Past Medical History:  Diagnosis Date   Arthritis    KNEES   Asthma    BPH (benign prostatic hypertrophy)    DIFFICULTY GETTING STREAM STARTED   Eczema    HANDS   GERD (gastroesophageal reflux disease)    H/O hiatal hernia    Hyperlipidemia    Hyperlipidemia    DOES NOT TOLERATE ANY OF THE STATINS   Persistent atrial fibrillation (HCC)    Dr Tamala Julian   Pneumonia    LAST EPISODE JAN 2015   Right knee meniscal tear    PAIN RT KNEE   Sleep apnea    with cpap   Past Surgical History:  Procedure Laterality Date   CARDIOVERSION N/A 06/10/2020   Procedure: CARDIOVERSION;  Surgeon: Elouise Munroe, MD;  Location: [facility] ENDOSCOPY;  Service: Cardiovascular;  Laterality: N/A;   [place] ARTHROSCOPY Right 04/17/2013   Procedure: ARTHROSCOPY RIGHT KNEE WITH MEDIAL AND LATERAL DEBRIDEMENT AND CONDRAPLASTY;  Surgeon: Gearlean Alf, MD;  Location: [facility] ORS;  Service: Orthopedics;  Laterality: Right;   LEFT KNEE ARTHROSCPY  2009   TEE WITHOUT CARDIOVERSION N/A 06/10/2020   Procedure: TRANSESOPHAGEAL ECHOCARDIOGRAM (TEE);  Surgeon: Elouise Munroe, MD;  Location[facility] ENDOSCOPY;  Service: Cardiovascular;  Laterality: N/A;    ROS- all systems are reviewed and negatives except as per HPI above  Current Outpatient Medications  Medication Sig Dispense Refill   albuterol (PROVENTIL HFA;VENTOLIN HFA) 108 (90 BASE) MCG/ACT inhaler Inhale 2 puffs into the lungs every 6 (six)  hours as needed for wheezing or shortness of breath.     dabigatran (PRADAXA) 150 MG CAPS capsule TAKE 1 CAPSULE BY MOUTH EVERY 12 HOURS 180 capsule 3   esomeprazole (NEXIUM) 40 MG capsule Take 40 mg by mouth daily.  1   fluticasone (FLONASE) 50 MCG/ACT nasal spray Place 1 spray into both nostrils 2 (two) times a day.     losartan (COZAAR) 50 MG tablet TAKE 1 TABLET BY MOUTH EVERY DAY 90 tablet 0   methylcellulose (ARTIFICIAL TEARS) 1 % ophthalmic solution Place 1 drop into both eyes 2 (two) times daily as needed (dry eyes).     SYMBICORT 160-4.5 MCG/ACT inhaler SMARTSIG:2 Puff(s) By Mouth Twice Daily     diltiazem (CARDIZEM CD) 240 MG 24 hr capsule Take 1 capsule (240 mg total) by mouth daily. 90 capsule 3   No current facility-administered medications for this visit.    Physical Exam: Vitals:   10/14/20 1606  BP: 134/70  Pulse: 87  SpO2: 97%  Weight: 276 lb (125.2 kg)  Height: '5\' 11"'$  (1.803 m)    GEN- The patient is well appearing, alert and oriented x 3 today.   Head- normocephalic, atraumatic Eyes-  Sclera clear, conjunctiva pink Ears- hearing intact Oropharynx- clear Lungs- Clear to ausculation bilaterally, normal work of breathing Heart- Regular rate and rhythm, no murmurs, rubs or gallops, PMI not laterally displaced GI- soft, NT, ND, + BS Extremities- no clubbing,  cyanosis, or edema  Wt Readings from Last 3 Encounters:  10/14/20 276 lb (125.2 kg)  07/13/20 275 lb (124.7 kg)  06/10/20 275 lb (124.7 kg)    EKG tracing ordered today is personally reviewed and shows afib  Assessment and Plan:  Persistent afib/ atrial flutter Rate controlled The patient has symptomatic, recurrent  atrial fibrillation. he has failed medical therapy with flecianide.  anticoagulated with pradaxa . Therapeutic strategies for afib including rate and rhythm control were discussed at length.  We discussed tikosyn and ablation as options. He is very clear that he would prefer rate control  going forward. We will reassess in 4 months with an echo at that time.  2. Morbid obesity Body mass index is 38.49 kg/m. Lifestyle modification is advised  3. OSA Compliance with CPAP is encouraged  4. ETOH Drinks 5-6 drinks per day We discussed at length importance of EToh reduction  Risks, benefits and potential toxicities for medications prescribed and/or refilled reviewed with patient today.   Return in 4 months  Thompson Grayer MD, [facility] 10/14/2020 4:23 PM

## 2020-11-23 DIAGNOSIS — Z23 Encounter for immunization: Secondary | ICD-10-CM | POA: Diagnosis not present

## 2020-12-08 ENCOUNTER — Other Ambulatory Visit: Payer: Self-pay | Admitting: Interventional Cardiology

## 2020-12-09 DIAGNOSIS — Z23 Encounter for immunization: Secondary | ICD-10-CM | POA: Diagnosis not present

## 2021-01-04 ENCOUNTER — Encounter: Payer: Self-pay | Admitting: Internal Medicine

## 2021-01-04 ENCOUNTER — Other Ambulatory Visit: Payer: Self-pay

## 2021-01-04 ENCOUNTER — Ambulatory Visit (INDEPENDENT_AMBULATORY_CARE_PROVIDER_SITE_OTHER): Payer: Medicare Other | Admitting: Internal Medicine

## 2021-01-04 VITALS — BP 138/86 | HR 96 | Temp 98.2°F | Resp 16 | Ht 71.0 in | Wt 270.0 lb

## 2021-01-04 DIAGNOSIS — I10 Essential (primary) hypertension: Secondary | ICD-10-CM | POA: Diagnosis not present

## 2021-01-04 DIAGNOSIS — T466X5A Adverse effect of antihyperlipidemic and antiarteriosclerotic drugs, initial encounter: Secondary | ICD-10-CM | POA: Diagnosis not present

## 2021-01-04 DIAGNOSIS — J4520 Mild intermittent asthma, uncomplicated: Secondary | ICD-10-CM | POA: Diagnosis not present

## 2021-01-04 DIAGNOSIS — D539 Nutritional anemia, unspecified: Secondary | ICD-10-CM | POA: Insufficient documentation

## 2021-01-04 DIAGNOSIS — R972 Elevated prostate specific antigen [PSA]: Secondary | ICD-10-CM | POA: Diagnosis not present

## 2021-01-04 DIAGNOSIS — E785 Hyperlipidemia, unspecified: Secondary | ICD-10-CM

## 2021-01-04 DIAGNOSIS — E538 Deficiency of other specified B group vitamins: Secondary | ICD-10-CM | POA: Diagnosis not present

## 2021-01-04 DIAGNOSIS — G320 Subacute combined degeneration of spinal cord in diseases classified elsewhere: Secondary | ICD-10-CM

## 2021-01-04 DIAGNOSIS — E118 Type 2 diabetes mellitus with unspecified complications: Secondary | ICD-10-CM | POA: Diagnosis not present

## 2021-01-04 DIAGNOSIS — E1169 Type 2 diabetes mellitus with other specified complication: Secondary | ICD-10-CM | POA: Insufficient documentation

## 2021-01-04 DIAGNOSIS — G720 Drug-induced myopathy: Secondary | ICD-10-CM | POA: Diagnosis not present

## 2021-01-04 DIAGNOSIS — Z23 Encounter for immunization: Secondary | ICD-10-CM | POA: Diagnosis not present

## 2021-01-04 DIAGNOSIS — D500 Iron deficiency anemia secondary to blood loss (chronic): Secondary | ICD-10-CM | POA: Diagnosis not present

## 2021-01-04 DIAGNOSIS — I4819 Other persistent atrial fibrillation: Secondary | ICD-10-CM | POA: Diagnosis not present

## 2021-01-04 LAB — HEPATIC FUNCTION PANEL
ALT: 84 U/L — ABNORMAL HIGH (ref 0–53)
AST: 87 U/L — ABNORMAL HIGH (ref 0–37)
Albumin: 4 g/dL (ref 3.5–5.2)
Alkaline Phosphatase: 54 U/L (ref 39–117)
Bilirubin, Direct: 0.2 mg/dL (ref 0.0–0.3)
Total Bilirubin: 0.7 mg/dL (ref 0.2–1.2)
Total Protein: 7.3 g/dL (ref 6.0–8.3)

## 2021-01-04 LAB — URINALYSIS, ROUTINE W REFLEX MICROSCOPIC
Bilirubin Urine: NEGATIVE
Hgb urine dipstick: NEGATIVE
Ketones, ur: NEGATIVE
Leukocytes,Ua: NEGATIVE
Nitrite: NEGATIVE
RBC / HPF: NONE SEEN (ref 0–?)
Specific Gravity, Urine: 1.01 (ref 1.000–1.030)
Total Protein, Urine: NEGATIVE
Urine Glucose: NEGATIVE
Urobilinogen, UA: 0.2 (ref 0.0–1.0)
pH: 6.5 (ref 5.0–8.0)

## 2021-01-04 LAB — IBC + FERRITIN
Ferritin: 23.6 ng/mL (ref 22.0–322.0)
Iron: 41 ug/dL — ABNORMAL LOW (ref 42–165)
Saturation Ratios: 8.2 % — ABNORMAL LOW (ref 20.0–50.0)
TIBC: 498.4 ug/dL — ABNORMAL HIGH (ref 250.0–450.0)
Transferrin: 356 mg/dL (ref 212.0–360.0)

## 2021-01-04 LAB — MICROALBUMIN / CREATININE URINE RATIO
Creatinine,U: 79 mg/dL
Microalb Creat Ratio: 1.4 mg/g (ref 0.0–30.0)
Microalb, Ur: 1.1 mg/dL (ref 0.0–1.9)

## 2021-01-04 LAB — BASIC METABOLIC PANEL
BUN: 13 mg/dL (ref 6–23)
CO2: 27 mEq/L (ref 19–32)
Calcium: 8.9 mg/dL (ref 8.4–10.5)
Chloride: 100 mEq/L (ref 96–112)
Creatinine, Ser: 0.88 mg/dL (ref 0.40–1.50)
GFR: 85.42 mL/min (ref >60.00)
Glucose, Bld: 98 mg/dL (ref 70–99)
Potassium: 4 mEq/L (ref 3.5–5.1)
Sodium: 136 mEq/L (ref 135–145)

## 2021-01-04 LAB — LIPID PANEL
Cholesterol: 222 mg/dL — ABNORMAL HIGH (ref 0–200)
HDL: 48.5 mg/dL (ref >39.00)
LDL Cholesterol: 152 mg/dL — ABNORMAL HIGH (ref 0–99)
NonHDL: 173.32
Total CHOL/HDL Ratio: 5
Triglycerides: 105 mg/dL (ref 0.0–149.0)
VLDL: 21 mg/dL (ref 0.0–40.0)

## 2021-01-04 LAB — HEMOGLOBIN A1C: Hgb A1c MFr Bld: 6.5 % (ref 4.6–6.5)

## 2021-01-04 LAB — VITAMIN B12: Vitamin B-12: 189 pg/mL — ABNORMAL LOW (ref 211–911)

## 2021-01-04 LAB — TSH: TSH: 3.47 u[IU]/mL (ref 0.35–5.50)

## 2021-01-04 LAB — PSA: PSA: 14.79 ng/mL — ABNORMAL HIGH (ref 0.10–4.00)

## 2021-01-04 MED ORDER — SYMBICORT 160-4.5 MCG/ACT IN AERO
INHALATION_SPRAY | RESPIRATORY_TRACT | 1 refills | Status: DC
Start: 2021-01-04 — End: 2021-07-15

## 2021-01-04 MED ORDER — LEVALBUTEROL TARTRATE 45 MCG/ACT IN AERO: 1.0000 | INHALATION_SPRAY | Freq: Four times a day (QID) | RESPIRATORY_TRACT | 2 refills | Status: DC | PRN

## 2021-01-04 NOTE — Patient Instructions (Signed)
Anemia Anemia is a condition in which there is not enough red blood cells or hemoglobin in the blood. Hemoglobin is a substance in red blood cells that carries oxygen. When you do not have enough red blood cells or hemoglobin (are anemic), your body cannot get enough oxygen and your organs may not work properly. As a result, you may feel very tired or have other problems. What are the causes? Common causes of anemia include: Excessive bleeding. Anemia can be caused by excessive bleeding inside or outside the body, including bleeding from the intestines or from heavy menstrual periods in females. Poor nutrition. Long-lasting (chronic) kidney, thyroid, and liver disease. Bone marrow disorders, spleen problems, and blood disorders. Cancer and treatments for cancer. HIV (human immunodeficiency virus) and AIDS (acquired immunodeficiency syndrome). Infections, medicines, and autoimmune disorders that destroy red blood cells. What are the signs or symptoms? Symptoms of this condition include: Minor weakness. Dizziness. Headache, or difficulties concentrating and sleeping. Heartbeats that feel irregular or faster than normal (palpitations). Shortness of breath, especially with exercise. Pale skin, lips, and nails, or cold hands and feet. Indigestion and nausea. Symptoms may occur suddenly or develop slowly. If your anemia is mild, you may not have symptoms. How is this diagnosed? This condition is diagnosed based on blood tests, your medical history, and a physical exam. In some cases, a test may be needed in which cells are removed from the soft tissue inside of a bone and looked at under a microscope (bone marrow biopsy). Your health care provider may also check your stool (feces) for blood and may do additional testing to look for the cause of your bleeding. Other tests may include: Imaging tests, such as a CT scan or MRI. A procedure to see inside your esophagus and stomach (endoscopy). A  procedure to see inside your colon and rectum (colonoscopy). How is this treated? Treatment for this condition depends on the cause. If you continue to lose a lot of blood, you may need to be treated at a hospital. Treatment may include: Taking supplements of iron, vitamin B12, or folic acid. Taking a hormone medicine (erythropoietin) that can help to stimulate red blood cell growth. Having a blood transfusion. This may be needed if you lose a lot of blood. Making changes to your diet. Having surgery to remove your spleen. Follow these instructions at home: Take over-the-counter and prescription medicines only as told by your health care provider. Take supplements only as told by your health care provider. Follow any diet instructions that you were given by your health care provider. Keep all follow-up visits as told by your health care provider. This is important. Contact a health care provider if: You develop new bleeding anywhere in the body. Get help right away if: You are very weak. You are short of breath. You have pain in your abdomen or chest. You are dizzy or feel faint. You have trouble concentrating. You have bloody stools, black stools, or tarry stools. You vomit repeatedly or you vomit up blood. These symptoms may represent a serious problem that is an emergency. Do not wait to see if the symptoms will go away. Get medical help right away. Call your local emergency services (911 in the [place] Do not drive yourself to the hospital. Summary Anemia is a condition in which you do not have enough red blood cells or enough of a substance in your red blood cells that carries oxygen (hemoglobin). Symptoms may occur suddenly or develop slowly. If your anemia is   mild, you may not have symptoms. This condition is diagnosed with blood tests, a medical history, and a physical exam. Other tests may be needed. Treatment for this condition depends on the cause of the anemia. This  information is not intended to replace advice given to you by your health care provider. Make sure you discuss any questions you have with your health care provider. Document Revised: 01/22/2019 Document Reviewed: 01/22/2019 Elsevier Patient Education  2022 [employer]vier Inc.  

## 2021-01-04 NOTE — Progress Notes (Signed)
Subjective:  Patient ID: Brian Myers, male    DOB: Jan 29, 1946  Age: 75 y.o. MRN: [id number]  CC: Anemia, Atrial Fibrillation, and Diabetes  This visit occurred during the SARS-CoV-2 public health emergency.  Safety protocols were in place, including screening questions prior to the visit, additional usage of staff PPE, and extensive cleaning of exam room while observing appropriate contact time as indicated for disinfecting solutions.    HPI Brian Myers presents for establishing.  He is active and denies CP/DOE/palpitations/edema/fatigue.  History Brian Myers has a past medical history of Arthritis, Asthma, BPH (benign prostatic hypertrophy), Eczema, GERD (gastroesophageal reflux disease), H/O hiatal hernia, Hyperlipidemia, Hyperlipidemia, Persistent atrial fibrillation ([place]), Pneumonia, Right knee meniscal tear, and Sleep apnea.   He has a past surgical history that includes Cervical fusion (1991); LEFT KNEE ARTHROSCPY (2009); Knee arthroscopy (Right, 04/17/2013); TEE without cardioversion (N/A, 06/10/2020); and Cardioversion (N/A, 06/10/2020).   His family history includes Arrhythmia in his mother; Hypertension in his father; Mitral valve prolapse in his mother.He reports that he has never smoked. He has never used smokeless tobacco. He reports current alcohol use. He reports that he does not use drugs.  Outpatient Medications Prior to Visit  Medication Sig Dispense Refill   dabigatran (PRADAXA) 150 MG CAPS capsule TAKE 1 CAPSULE BY MOUTH EVERY 12 HOURS 180 capsule 3   esomeprazole (NEXIUM) 40 MG capsule Take 40 mg by mouth daily.  1   fluticasone (FLONASE) 50 MCG/ACT nasal spray Place 1 spray into both nostrils 2 (two) times a day.     losartan (COZAAR) 50 MG tablet TAKE 1 TABLET BY MOUTH EVERY DAY 90 tablet 0   methylcellulose (ARTIFICIAL TEARS) 1 % ophthalmic solution Place 1 drop into both eyes 2 (two) times daily as needed (dry eyes).     albuterol (PROVENTIL HFA;VENTOLIN HFA)  108 (90 BASE) MCG/ACT inhaler Inhale 2 puffs into the lungs every 6 (six) hours as needed for wheezing or shortness of breath.     SYMBICORT 160-4.5 MCG/ACT inhaler SMARTSIG:2 Puff(s) By Mouth Twice Daily     diltiazem (CARDIZEM CD) 240 MG 24 hr capsule Take 1 capsule (240 mg total) by mouth daily. 90 capsule 3   No facility-administered medications prior to visit.    ROS Review of Systems  Constitutional:  Negative for appetite change, diaphoresis, fatigue and unexpected weight change.  HENT: Negative.    Eyes: Negative.   Respiratory:  Negative for cough, chest tightness, shortness of breath and wheezing.   Cardiovascular:  Negative for chest pain, palpitations and leg swelling.  Gastrointestinal:  Negative for abdominal pain, constipation, diarrhea and vomiting.  Genitourinary: Negative.   Musculoskeletal: Negative.  Negative for arthralgias, joint swelling and myalgias.  Skin: Negative.  Negative for color change and pallor.  Neurological:  Positive for numbness (N in both feet, R>L). Negative for dizziness and weakness.  Hematological: Negative.  Negative for adenopathy. Does not bruise/bleed easily.  Psychiatric/Behavioral: Negative.     Objective:  BP 138/86 (BP Location: Right Arm, Patient Position: Sitting, Cuff Size: Large)   Pulse 96   Temp 98.2 F (36.8 C) (Oral)   Resp 16   Ht 5\' 11"  (1.803 m)   Wt 270 lb (122.5 kg)   SpO2 98%   BMI 37.66 kg/m   Physical Exam Vitals reviewed.  Constitutional:      General: He is not in acute distress.    Appearance: He is obese. He is not toxic-appearing or diaphoretic.  HENT:  Nose: Nose normal.     Mouth/Throat:     Mouth: Mucous membranes are moist.  Eyes:     Conjunctiva/sclera: Conjunctivae normal.  Cardiovascular:     Rate and Rhythm: Tachycardia present. Rhythm irregularly irregular.     Pulses: Normal pulses.     Heart sounds: No murmur heard. Pulmonary:     Effort: Pulmonary effort is normal.     Breath  sounds: No stridor. No wheezing, rhonchi or rales.  Abdominal:     General: Abdomen is protuberant. Bowel sounds are normal. There is no distension.     Palpations: Abdomen is soft. There is no hepatomegaly, splenomegaly or mass.     Tenderness: There is no abdominal tenderness.  Musculoskeletal:        General: Normal range of motion.     Cervical back: Neck supple.     Right lower leg: No edema.     Left lower leg: No edema.  Lymphadenopathy:     Cervical: No cervical adenopathy.  Skin:    General: Skin is warm and dry.     Coloration: Skin is not pale.     Findings: No rash.  Neurological:     General: No focal deficit present.     Mental Status: He is alert.  Psychiatric:        Mood and Affect: Mood normal.        Behavior: Behavior normal.    Lab Results  Component Value Date   WBC 7.7 06/03/2020   HGB 12.1 (L) 06/03/2020   HCT 37.4 (L) 06/03/2020   PLT 265 06/03/2020   GLUCOSE 98 01/04/2021   CHOL 222 (H) 01/04/2021   TRIG 105.0 01/04/2021   HDL 48.50 01/04/2021   LDLCALC 152 (H) 01/04/2021   ALT 84 (H) 01/04/2021   AST 87 (H) 01/04/2021   NA 136 01/04/2021   K 4.0 01/04/2021   CL 100 01/04/2021   CREATININE 0.88 01/04/2021   BUN 13 01/04/2021   CO2 27 01/04/2021   TSH 3.47 01/04/2021   PSA 14.79 (H) 01/04/2021   INR 1.04 04/12/2013   HGBA1C 6.5 01/04/2021   MICROALBUR 1.1 01/04/2021     Assessment & Plan:   Brian Myers was seen today for anemia, atrial fibrillation and diabetes.  Diagnoses and all orders for this visit:  Essential hypertension- His BP is adequately well controlled. -     Basic metabolic panel; Future -     Urinalysis, Routine w reflex microscopic; Future -     Urinalysis, Routine w reflex microscopic -     Basic metabolic panel  Need for vaccination -     Pneumococcal conjugate vaccine 20-valent (Prevnar 20)  Type II diabetes mellitus with manifestations ([place]) -     Basic metabolic panel; Future -     Hemoglobin A1c; Future -      Microalbumin / creatinine urine ratio; Future -     HM Diabetes Foot Exam -     Ambulatory referral to Ophthalmology -     Microalbumin / creatinine urine ratio -     Hemoglobin A1c -     Basic metabolic panel  Hyperlipidemia with target LDL less than 100- He will not take a statin. -     Lipid panel; Future -     TSH; Future -     Hepatic function panel; Future -     Hepatic function panel -     TSH -     Lipid panel  Persistent  atrial fibrillation ([place])- He has good rate control. Will cont the anticoagulant. -     TSH; Future -     TSH  Mild intermittent asthma without complication -     SYMBICORT 160-4.5 MCG/ACT inhaler; SMARTSIG:2 Puff(s) By Mouth Twice Daily -     levalbuterol (XOPENEX HFA) 45 MCG/ACT inhaler; Inhale 1 puff into the lungs every 6 (six) hours as needed for wheezing.  Deficiency anemia- Will treat the vitamin deficiencies. -     CBC with Differential/Platelet; Future -     Vitamin B12; Future -     IBC + Ferritin; Future -     Vitamin B1; Future -     Vitamin B1 -     IBC + Ferritin -     Vitamin B12 -     CBC with Differential/Platelet -     CBC with Differential; Future  PSA elevation -     PSA; Future -     PSA -     Ambulatory referral to Urology  Iron deficiency anemia due to chronic blood loss -     Ferric Maltol (ACCRUFER) 30 MG CAPS; Take 1 capsule by mouth in the morning and at bedtime. 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  Statin myopathy- see above.  I have discontinued Tobias Avitabile. Loa "Mike"'s albuterol. I am also having him start on levalbuterol and ACCRUFeR. Additionally, I am having him maintain his methylcellulose, fluticasone, esomeprazole, diltiazem, dabigatran, losartan, and Symbicort.  Meds ordered this encounter  Medications   SYMBICORT 160-4.5 MCG/ACT inhaler    Sig: SMARTSIG:2 Puff(s) By Mouth Twice Daily     Dispense:  3 each    Refill:  1   levalbuterol (XOPENEX HFA) 45 MCG/ACT inhaler    Sig: Inhale 1 puff into the lungs every 6 (six) hours as needed for wheezing.    Dispense:  45 g    Refill:  2   Ferric Maltol (ACCRUFER) 30 MG CAPS    Sig: Take 1 capsule by mouth in the morning and at bedtime.    Dispense:  180 capsule    Refill:  0      Follow-up: Return in about 3 months (around 04/06/2021).  Scarlette Calico, MD

## 2021-01-05 DIAGNOSIS — D500 Iron deficiency anemia secondary to blood loss (chronic): Secondary | ICD-10-CM | POA: Insufficient documentation

## 2021-01-05 DIAGNOSIS — E538 Deficiency of other specified B group vitamins: Secondary | ICD-10-CM | POA: Insufficient documentation

## 2021-01-05 MED ORDER — ACCRUFER 30 MG PO CAPS: 1.0000 | ORAL_CAPSULE | Freq: Two times a day (BID) | ORAL | 0 refills | Status: DC

## 2021-01-06 ENCOUNTER — Telehealth: Payer: Self-pay | Admitting: Internal Medicine

## 2021-01-06 DIAGNOSIS — G720 Drug-induced myopathy: Secondary | ICD-10-CM | POA: Insufficient documentation

## 2021-01-06 NOTE — Telephone Encounter (Signed)
Referral updated to reflect [facility]

## 2021-01-06 NOTE — Telephone Encounter (Signed)
Magda Paganini from [facility] has called and states that Brian Myers. Is requesting to be seen at [facility] in [place], opposed to their office. States he is requesting Dr. Oralia Manis t perform eye exam.    Callback #- [phone]

## 2021-01-10 LAB — EXTRA SPECIMEN

## 2021-01-10 LAB — VITAMIN B1

## 2021-01-28 ENCOUNTER — Other Ambulatory Visit (INDEPENDENT_AMBULATORY_CARE_PROVIDER_SITE_OTHER): Payer: Medicare Other

## 2021-01-28 ENCOUNTER — Ambulatory Visit (INDEPENDENT_AMBULATORY_CARE_PROVIDER_SITE_OTHER): Payer: Medicare Other

## 2021-01-28 ENCOUNTER — Other Ambulatory Visit: Payer: Self-pay

## 2021-01-28 DIAGNOSIS — D539 Nutritional anemia, unspecified: Secondary | ICD-10-CM | POA: Diagnosis not present

## 2021-01-28 DIAGNOSIS — G320 Subacute combined degeneration of spinal cord in diseases classified elsewhere: Secondary | ICD-10-CM | POA: Diagnosis not present

## 2021-01-28 DIAGNOSIS — E538 Deficiency of other specified B group vitamins: Secondary | ICD-10-CM | POA: Diagnosis not present

## 2021-01-28 LAB — CBC WITH DIFFERENTIAL/PLATELET
Basophils Absolute: 0 10*3/uL (ref 0.0–0.1)
Basophils Relative: 0.6 % (ref 0.0–3.0)
Eosinophils Absolute: 0.1 10*3/uL (ref 0.0–0.7)
Eosinophils Relative: 2.1 % (ref 0.0–5.0)
HCT: 35.1 % — ABNORMAL LOW (ref 39.0–52.0)
Hemoglobin: 11.2 g/dL — ABNORMAL LOW (ref 13.0–17.0)
Lymphocytes Relative: 31.1 % (ref 12.0–46.0)
Lymphs Abs: 2 10*3/uL (ref 0.7–4.0)
MCHC: 32 g/dL (ref 30.0–36.0)
MCV: 83.2 fl (ref 78.0–100.0)
Monocytes Absolute: 0.6 10*3/uL (ref 0.1–1.0)
Monocytes Relative: 9.5 % (ref 3.0–12.0)
Neutro Abs: 3.6 10*3/uL (ref 1.4–7.7)
Neutrophils Relative %: 56.7 % (ref 43.0–77.0)
Platelets: 226 10*3/uL (ref 150.0–400.0)
RBC: 4.22 Mil/uL (ref 4.22–5.81)
RDW: 18.1 % — ABNORMAL HIGH (ref 11.5–15.5)
WBC: 6.4 10*3/uL (ref 4.0–10.5)

## 2021-01-28 MED ORDER — CYANOCOBALAMIN 1000 MCG/ML IJ SOLN
1000.0000 ug | Freq: Once | INTRAMUSCULAR | Status: AC
  Administered 2021-01-28: 1000 ug via INTRAMUSCULAR

## 2021-01-28 NOTE — Progress Notes (Addendum)
Pt was given B12 w/o any complications. 

## 2021-02-01 ENCOUNTER — Other Ambulatory Visit: Payer: Self-pay

## 2021-02-01 ENCOUNTER — Ambulatory Visit (HOSPITAL_COMMUNITY): Payer: Medicare Other | Attending: Cardiovascular Disease

## 2021-02-01 DIAGNOSIS — I480 Paroxysmal atrial fibrillation: Secondary | ICD-10-CM

## 2021-02-01 DIAGNOSIS — Z9989 Dependence on other enabling machines and devices: Secondary | ICD-10-CM

## 2021-02-01 DIAGNOSIS — G4733 Obstructive sleep apnea (adult) (pediatric): Secondary | ICD-10-CM | POA: Diagnosis present

## 2021-02-01 DIAGNOSIS — I484 Atypical atrial flutter: Secondary | ICD-10-CM

## 2021-02-01 DIAGNOSIS — F1010 Alcohol abuse, uncomplicated: Secondary | ICD-10-CM | POA: Diagnosis present

## 2021-02-01 LAB — ECHOCARDIOGRAM COMPLETE
Area-P 1/2: 3.64 cm2
S' Lateral: 2.7 cm

## 2021-02-05 ENCOUNTER — Other Ambulatory Visit: Payer: Self-pay

## 2021-02-05 ENCOUNTER — Encounter: Payer: Self-pay | Admitting: Internal Medicine

## 2021-02-05 ENCOUNTER — Ambulatory Visit (INDEPENDENT_AMBULATORY_CARE_PROVIDER_SITE_OTHER): Payer: Medicare Other | Admitting: Internal Medicine

## 2021-02-05 VITALS — BP 130/74 | HR 97 | Ht 71.0 in | Wt 272.6 lb

## 2021-02-05 DIAGNOSIS — G4733 Obstructive sleep apnea (adult) (pediatric): Secondary | ICD-10-CM | POA: Diagnosis not present

## 2021-02-05 DIAGNOSIS — Z9989 Dependence on other enabling machines and devices: Secondary | ICD-10-CM

## 2021-02-05 DIAGNOSIS — F1010 Alcohol abuse, uncomplicated: Secondary | ICD-10-CM

## 2021-02-05 DIAGNOSIS — I480 Paroxysmal atrial fibrillation: Secondary | ICD-10-CM | POA: Diagnosis not present

## 2021-02-05 DIAGNOSIS — I484 Atypical atrial flutter: Secondary | ICD-10-CM

## 2021-02-05 MED ORDER — DILTIAZEM HCL ER COATED BEADS 360 MG PO CP24: 360.0000 mg | ORAL_CAPSULE | Freq: Every day | ORAL | 3 refills | Status: DC

## 2021-02-05 NOTE — Patient Instructions (Addendum)
Medication Instructions:  Increase Diltiazem to 360 mg daily  Your physician recommends that you continue on your current medications as directed. Please refer to the Current Medication list given to you today. *If you need a refill on your cardiac medications before your next appointment, please call your pharmacy*  Lab Work: None. If you have labs (blood work) drawn today and your tests are completely normal, you will receive your results only by: [place] (if you have MyChart) OR A paper copy in the mail If you have any lab test that is abnormal or we need to change your treatment, we will call you to review the results.  Testing/Procedures: None.  Follow-Up: At [facility], you and your health needs are our priority.  As part of our continuing mission to provide you with exceptional heart care, we have created designated Provider Care Teams.  These Care Teams include your primary Cardiologist (physician) and Advanced Practice Providers (APPs -  Physician Assistants and Nurse Practitioners) who all work together to provide you with the care you need, when you need it.  Your physician wants you to follow-up in: 06/07/21 at 11 am  Brian Myers, [place]   We recommend signing up for the patient portal called "MyChart".  Sign up information is provided on this After Visit Summary.  MyChart is used to connect with patients for Virtual Visits (Telemedicine).  Patients are able to view lab/test results, encounter notes, upcoming appointments, etc.  Non-urgent messages can be sent to your provider as well.   To learn more about what you can do with MyChart, go to [email or web address].    Any Other Special Instructions Will Be Listed Below (If Applicable).

## 2021-02-05 NOTE — Progress Notes (Signed)
PCP: Janith Lima, MD Primary Cardiologist: Dr Tamala Julian Primary EP: Dr Rayann Heman  Brian Myers is a 75 y.o. male who presents today for routine electrophysiology followup.  Since last being seen in our clinic, the patient reports doing very well.  Today, he denies symptoms of palpitations, chest pain, shortness of breath,  lower extremity edema, dizziness, presyncope, or syncope.  The patient is otherwise without complaint today.   Past Medical History:  Diagnosis Date   Arthritis    KNEES   Asthma    BPH (benign prostatic hypertrophy)    DIFFICULTY GETTING STREAM STARTED   Eczema    HANDS   GERD (gastroesophageal reflux disease)    H/O hiatal hernia    Hyperlipidemia    Hyperlipidemia    DOES NOT TOLERATE ANY OF THE STATINS   Persistent atrial fibrillation ([facility])    Dr Tamala Julian   Pneumonia    LAST EPISODE JAN 2015   Right knee meniscal tear    PAIN RT KNEE   Sleep apnea    with cpap   Past Surgical History:  Procedure Laterality Date   CARDIOVERSION N/A 06/10/2020   Procedure: CARDIOVERSION;  Surgeon: Elouise Munroe, MD;  Location: [facility] ENDOSCOPY;  Service: Cardiovascular;  Laterality: N/A;   [place] ARTHROSCOPY Right 04/17/2013   Procedure: ARTHROSCOPY RIGHT KNEE WITH MEDIAL AND LATERAL DEBRIDEMENT AND CONDRAPLASTY;  Surgeon: Gearlean Alf, MD;  Location: [facility] ORS;  Service: Orthopedics;  Laterality: Right;   LEFT KNEE ARTHROSCPY  2009   TEE WITHOUT CARDIOVERSION N/A 06/10/2020   Procedure: TRANSESOPHAGEAL ECHOCARDIOGRAM (TEE);  Surgeon: Elouise Munroe, MD;  Location: [facility] ENDOSCOPY;  Service: Cardiovascular;  Laterality: N/A;    ROS- all systems are reviewed and negatives except as per HPI above  Current Outpatient Medications  Medication Sig Dispense Refill   dabigatran (PRADAXA) 150 MG CAPS capsule TAKE 1 CAPSULE BY MOUTH EVERY 12 HOURS 180 capsule 3   esomeprazole (NEXIUM) 40 MG capsule Take 40 mg by mouth daily.  1   Ferric Maltol  (ACCRUFER) 30 MG CAPS Take 1 capsule by mouth in the morning and at bedtime. 180 capsule 0   fluticasone (FLONASE) 50 MCG/ACT nasal spray Place 1 spray into both nostrils 2 (two) times a day.     levalbuterol (XOPENEX HFA) 45 MCG/ACT inhaler Inhale 1 puff into the lungs every 6 (six) hours as needed for wheezing. 45 g 2   losartan (COZAAR) 50 MG tablet TAKE 1 TABLET BY MOUTH EVERY DAY 90 tablet 0   methylcellulose (ARTIFICIAL TEARS) 1 % ophthalmic solution Place 1 drop into both eyes 2 (two) times daily as needed (dry eyes).     SYMBICORT 160-4.5 MCG/ACT inhaler SMARTSIG:2 Puff(s) By Mouth Twice Daily 3 each 1   diltiazem (CARDIZEM CD) 240 MG 24 hr capsule Take 1 capsule (240 mg total) by mouth daily. 90 capsule 3   No current facility-administered medications for this visit.    Physical Exam: Vitals:   02/05/21 1437  Height: 5\' 11"  (1.803 m)    GEN- The patient is well appearing, alert and oriented x 3 today.   Head- normocephalic, atraumatic Eyes-  Sclera clear, conjunctiva pink Ears- hearing intact Oropharynx- clear Lungs-  normal work of breathing Heart- iRRR GI- soft  Extremities- no clubbing, cyanosis, or edema  Wt Readings from Last 3 Encounters:  01/04/21 270 lb (122.5 kg)  10/14/20 276 lb (125.2 kg)  07/13/20 275 lb (124.7 kg)  EKG tracing ordered today is personally reviewed and shows afib, V rate 97 bpm  Echo 12/22 reviewed at length with patient and spouse  Assessment and Plan:  Persistent afib/ atrial flutter Doing well with rate control He is on pradaxa for stroke prevention Increase diltiazem CD to 360mg  daily We discussed tikosyn and ablation as options today.  He would like to continue rate control at his time.  2. Morbid obesity Body mass index is 37.66 kg/m. Lifestyle modification advised  3. ETOH Drinks heavily Reduction advised  4. OSA Compliance with CPAP advised  Return in 4 months to see EP APP  Thompson Grayer MD,  [facility] 02/05/2021 2:44 PM

## 2021-02-26 ENCOUNTER — Other Ambulatory Visit: Payer: Self-pay | Admitting: Internal Medicine

## 2021-04-07 ENCOUNTER — Other Ambulatory Visit: Payer: Self-pay

## 2021-04-07 ENCOUNTER — Ambulatory Visit (INDEPENDENT_AMBULATORY_CARE_PROVIDER_SITE_OTHER): Payer: Medicare Other | Admitting: Internal Medicine

## 2021-04-07 ENCOUNTER — Ambulatory Visit (INDEPENDENT_AMBULATORY_CARE_PROVIDER_SITE_OTHER): Payer: Medicare Other

## 2021-04-07 ENCOUNTER — Encounter: Payer: Self-pay | Admitting: Internal Medicine

## 2021-04-07 VITALS — BP 130/82 | HR 73 | Temp 98.4°F | Ht 71.0 in | Wt 268.0 lb

## 2021-04-07 DIAGNOSIS — E538 Deficiency of other specified B group vitamins: Secondary | ICD-10-CM | POA: Diagnosis not present

## 2021-04-07 DIAGNOSIS — R052 Subacute cough: Secondary | ICD-10-CM

## 2021-04-07 DIAGNOSIS — I10 Essential (primary) hypertension: Secondary | ICD-10-CM | POA: Diagnosis not present

## 2021-04-07 DIAGNOSIS — D500 Iron deficiency anemia secondary to blood loss (chronic): Secondary | ICD-10-CM | POA: Diagnosis not present

## 2021-04-07 DIAGNOSIS — J4520 Mild intermittent asthma, uncomplicated: Secondary | ICD-10-CM | POA: Diagnosis not present

## 2021-04-07 DIAGNOSIS — Z860100 Personal history of colon polyps, unspecified: Secondary | ICD-10-CM | POA: Insufficient documentation

## 2021-04-07 DIAGNOSIS — E118 Type 2 diabetes mellitus with unspecified complications: Secondary | ICD-10-CM

## 2021-04-07 DIAGNOSIS — R059 Cough, unspecified: Secondary | ICD-10-CM | POA: Diagnosis not present

## 2021-04-07 DIAGNOSIS — R7301 Impaired fasting glucose: Secondary | ICD-10-CM | POA: Insufficient documentation

## 2021-04-07 DIAGNOSIS — D126 Benign neoplasm of colon, unspecified: Secondary | ICD-10-CM | POA: Insufficient documentation

## 2021-04-07 DIAGNOSIS — Z789 Other specified health status: Secondary | ICD-10-CM | POA: Insufficient documentation

## 2021-04-07 DIAGNOSIS — G320 Subacute combined degeneration of spinal cord in diseases classified elsewhere: Secondary | ICD-10-CM

## 2021-04-07 DIAGNOSIS — J329 Chronic sinusitis, unspecified: Secondary | ICD-10-CM | POA: Insufficient documentation

## 2021-04-07 DIAGNOSIS — Z862 Personal history of diseases of the blood and blood-forming organs and certain disorders involving the immune mechanism: Secondary | ICD-10-CM | POA: Insufficient documentation

## 2021-04-07 DIAGNOSIS — N400 Enlarged prostate without lower urinary tract symptoms: Secondary | ICD-10-CM | POA: Insufficient documentation

## 2021-04-07 DIAGNOSIS — R195 Other fecal abnormalities: Secondary | ICD-10-CM | POA: Insufficient documentation

## 2021-04-07 DIAGNOSIS — R7303 Prediabetes: Secondary | ICD-10-CM | POA: Insufficient documentation

## 2021-04-07 LAB — CBC WITH DIFFERENTIAL/PLATELET
Basophils Absolute: 0 10*3/uL (ref 0.0–0.1)
Basophils Relative: 0.5 % (ref 0.0–3.0)
Eosinophils Absolute: 0.1 10*3/uL (ref 0.0–0.7)
Eosinophils Relative: 2 % (ref 0.0–5.0)
HCT: 35.3 % — ABNORMAL LOW (ref 39.0–52.0)
Hemoglobin: 11 g/dL — ABNORMAL LOW (ref 13.0–17.0)
Lymphocytes Relative: 30.1 % (ref 12.0–46.0)
Lymphs Abs: 2.1 10*3/uL (ref 0.7–4.0)
MCHC: 31.3 g/dL (ref 30.0–36.0)
MCV: 81.5 fl (ref 78.0–100.0)
Monocytes Absolute: 0.5 10*3/uL (ref 0.1–1.0)
Monocytes Relative: 7 % (ref 3.0–12.0)
Neutro Abs: 4.3 10*3/uL (ref 1.4–7.7)
Neutrophils Relative %: 60.4 % (ref 43.0–77.0)
Platelets: 248 10*3/uL (ref 150.0–400.0)
RBC: 4.34 Mil/uL (ref 4.22–5.81)
RDW: 17.4 % — ABNORMAL HIGH (ref 11.5–15.5)
WBC: 7.1 10*3/uL (ref 4.0–10.5)

## 2021-04-07 LAB — IBC + FERRITIN
Ferritin: 14.7 ng/mL — ABNORMAL LOW (ref 22.0–322.0)
Iron: 23 ug/dL — ABNORMAL LOW (ref 42–165)
Saturation Ratios: 4.6 % — ABNORMAL LOW (ref 20.0–50.0)
TIBC: 498.4 ug/dL — ABNORMAL HIGH (ref 250.0–450.0)
Transferrin: 356 mg/dL (ref 212.0–360.0)

## 2021-04-07 LAB — FOLATE: Folate: 8 ng/mL (ref >5.9)

## 2021-04-07 MED ORDER — CYANOCOBALAMIN 1000 MCG/ML IJ SOLN
1000.0000 ug | Freq: Once | INTRAMUSCULAR | Status: AC
  Administered 2021-04-07: 1000 ug via INTRAMUSCULAR

## 2021-04-07 MED ORDER — MONTELUKAST SODIUM 10 MG PO TABS: 10.0000 mg | ORAL_TABLET | Freq: Every day | ORAL | 1 refills | Status: DC

## 2021-04-07 NOTE — Progress Notes (Signed)
Subjective:  Patient ID: Brian Myers, male    DOB: 1945/05/29  Age: 76 y.o. MRN: [id number]  CC: Anemia and Cough  This visit occurred during the SARS-CoV-2 public health emergency.  Safety protocols were in place, including screening questions prior to the visit, additional usage of staff PPE, and extensive cleaning of exam room while observing appropriate contact time as indicated for disinfecting solutions.    HPI LEVAUGHN PUCCINELLI presents for f/up -   He tells me that every time he picks up a cold he develops a chronic cough.  This has previously been treated with antibiotics and prednisone with no improvement.  He is being treated for asthma with Symbicort and a SABA.  He says the cough is nonproductive and he denies chest pain, shortness of breath, night sweats, diaphoresis, dizziness, lightheadedness, or edema.  He is not taking the iron supplement because it is too expensive.  Outpatient Medications Prior to Visit  Medication Sig Dispense Refill   dabigatran (PRADAXA) 150 MG CAPS capsule TAKE 1 CAPSULE BY MOUTH EVERY 12 HOURS 180 capsule 3   diltiazem (CARDIZEM CD) 360 MG 24 hr capsule Take 1 capsule (360 mg total) by mouth daily. 90 capsule 3   esomeprazole (NEXIUM) 40 MG capsule Take 40 mg by mouth daily.  1   fluticasone (FLONASE) 50 MCG/ACT nasal spray Place 1 spray into both nostrils 2 (two) times a day.     levalbuterol (XOPENEX HFA) 45 MCG/ACT inhaler Inhale 1 puff into the lungs every 6 (six) hours as needed for wheezing. 45 g 2   losartan (COZAAR) 50 MG tablet TAKE 1 TABLET BY MOUTH EVERY DAY 90 tablet 3   methylcellulose (ARTIFICIAL TEARS) 1 % ophthalmic solution Place 1 drop into both eyes 2 (two) times daily as needed (dry eyes).     SYMBICORT 160-4.5 MCG/ACT inhaler SMARTSIG:2 Puff(s) By Mouth Twice Daily 3 each 1   Ferric Maltol (ACCRUFER) 30 MG CAPS Take 1 capsule by mouth in the morning and at bedtime. (Patient not taking: Reported on 02/05/2021) 180 capsule 0    No facility-administered medications prior to visit.    ROS Review of Systems  Constitutional:  Negative for chills, diaphoresis, fatigue and fever.  HENT: Negative.    Eyes: Negative.   Respiratory:  Positive for cough. Negative for chest tightness, shortness of breath, wheezing and stridor.   Cardiovascular:  Negative for chest pain, palpitations and leg swelling.  Gastrointestinal:  Negative for abdominal pain, blood in stool, constipation, diarrhea, nausea and vomiting.  Endocrine: Negative.   Genitourinary:  Negative for difficulty urinating, dysuria and hematuria.  Musculoskeletal: Negative.   Skin: Negative.   Neurological:  Negative for dizziness, weakness, light-headedness, numbness and headaches.  Hematological:  Negative for adenopathy. Does not bruise/bleed easily.  Psychiatric/Behavioral: Negative.     Objective:  BP 130/82 (BP Location: Left Arm, Patient Position: Sitting, Cuff Size: Large)    Pulse 73    Temp 98.4 F (36.9 C) (Oral)    Ht 5\' 11"  (1.803 m)    Wt 268 lb (121.6 kg)    SpO2 97%    BMI 37.38 kg/m   BP Readings from Last 3 Encounters:  04/07/21 130/82  02/05/21 130/74  01/04/21 138/86    Wt Readings from Last 3 Encounters:  04/07/21 268 lb (121.6 kg)  02/05/21 272 lb 9.6 oz (123.7 kg)  01/04/21 270 lb (122.5 kg)    Physical Exam Vitals reviewed.  Constitutional:      Appearance:  He is not ill-appearing.  HENT:     Nose: Nose normal.     Mouth/Throat:     Mouth: Mucous membranes are moist.  Eyes:     General: No scleral icterus.    Conjunctiva/sclera: Conjunctivae normal.  Cardiovascular:     Rate and Rhythm: Normal rate and regular rhythm.     Heart sounds: No murmur heard. Pulmonary:     Effort: Pulmonary effort is normal.     Breath sounds: No stridor. No wheezing, rhonchi or rales.  Abdominal:     General: Abdomen is flat.     Palpations: There is no mass.     Tenderness: There is no abdominal tenderness. There is no guarding or  rebound.     Hernia: No hernia is present.  Musculoskeletal:        General: Normal range of motion.     Right lower leg: No edema.     Left lower leg: No edema.  Skin:    General: Skin is warm and dry.  Neurological:     General: No focal deficit present.     Mental Status: He is alert.  Psychiatric:        Mood and Affect: Mood normal.        Behavior: Behavior normal.    Lab Results  Component Value Date   WBC 7.1 04/07/2021   HGB 11.0 (L) 04/07/2021   HCT 35.3 (L) 04/07/2021   PLT 248.0 04/07/2021   GLUCOSE 98 01/04/2021   CHOL 222 (H) 01/04/2021   TRIG 105.0 01/04/2021   HDL 48.50 01/04/2021   LDLCALC 152 (H) 01/04/2021   ALT 84 (H) 01/04/2021   AST 87 (H) 01/04/2021   NA 136 01/04/2021   K 4.0 01/04/2021   CL 100 01/04/2021   CREATININE 0.88 01/04/2021   BUN 13 01/04/2021   CO2 27 01/04/2021   TSH 3.47 01/04/2021   PSA 14.79 (H) 01/04/2021   INR 1.04 04/12/2013   HGBA1C 6.5 01/04/2021   MICROALBUR 1.1 01/04/2021    ECHO TEE  Result Date: 06/10/2020    TRANSESOPHOGEAL ECHO REPORT   Patient Name:   HEKTOR HUSTON Date of Exam: 06/10/2020 Medical Rec #:  [id number]         Height:       71.0 in Accession #:    [id number]        Weight:       275.0 lb Date of Birth:  [birth year]         BSA:          2.414 m Patient Age:    7 years          BP:           169/100 mmHg Patient Gender: M                 HR:           111 bpm. Exam Location:  Outpatient Procedure: Transesophageal Echo and 3D Echo Indications:     atrial fibrillation  History:         Patient has prior history of Echocardiogram examinations, most                  recent 08/30/2016. Risk Factors:Sleep Apnea, Hypertension and                  Dyslipidemia.  Sonographer:     Johny Chess Referring Phys:  [id number] [place] Diagnosing Phys: Nadean Corwin  Margaretann Loveless MD PROCEDURE: After discussion of the risks and benefits of a TEE, an informed consent was obtained from the patient. TEE procedure time was 12  minutes. The transesophogeal probe was passed without difficulty through the esophogus of the patient. Imaged were obtained with the patient in a left lateral decubitus position. Local oropharyngeal anesthetic was provided with Cetacaine. Sedation performed by different physician. The patient was monitored while under deep sedation. Anesthestetic sedation was provided intravenously by Anesthesiology: 400mg  of Propofol, 20mg  of Lidocaine. Image quality was adequate. The patient's vital signs; including heart rate, blood pressure, and oxygen saturation; remained stable throughout the procedure. The patient developed no complications during the procedure. A successful direct current cardioversion was performed at 200 joules with 1 attempt. Limited images obtained on TEE due to inability to safely sedate patient without respiratory compromise. IMPRESSIONS  1. Left ventricular ejection fraction, by estimation, is 55 to 60%. The left ventricle has normal function.  2. Right ventricular systolic function is normal. The right ventricular size is normal.  3. Left atrial size was mildly dilated. No left atrial/left atrial appendage thrombus was detected.  4. Right atrial size was mildly dilated.  5. The mitral valve is normal in structure. Trivial mitral valve regurgitation. No evidence of mitral stenosis.  6. The aortic valve is normal in structure. Aortic valve regurgitation is not visualized. No aortic stenosis is present.  7. Aortic dilatation noted. Aneurysm of the aortic root, measuring 45 mm. There is dilatation of the aortic root.  8. Successful TEE cardioversion with 1 shock at 200J with sinus rhythm. FINDINGS  Left Ventricle: Left ventricular ejection fraction, by estimation, is 55 to 60%. The left ventricle has normal function. The left ventricular internal cavity size was normal in size. Right Ventricle: The right ventricular size is normal. No increase in right ventricular wall thickness. Right ventricular  systolic function is normal. Left Atrium: Left atrial size was mildly dilated. No left atrial/left atrial appendage thrombus was detected. Right Atrium: Right atrial size was mildly dilated. Pericardium: There is no evidence of pericardial effusion. Mitral Valve: The mitral valve is normal in structure. Trivial mitral valve regurgitation. No evidence of mitral valve stenosis. Tricuspid Valve: The tricuspid valve is normal in structure. Tricuspid valve regurgitation is mild . No evidence of tricuspid stenosis. Aortic Valve: The aortic valve is normal in structure. Aortic valve regurgitation is not visualized. No aortic stenosis is present. Pulmonic Valve: The pulmonic valve was grossly normal. Pulmonic valve regurgitation is mild. Aorta: Aortic dilatation noted. There is dilatation of the aortic root. There is an aneurysm involving the aortic root measuring 45 mm. IAS/Shunts: No atrial level shunt detected by color flow Doppler.   AORTA Ao Root diam: 4.50 cm Ao Asc diam:  3.50 cm Cherlynn Kaiser MD Electronically signed by Cherlynn Kaiser MD Signature Date/Time: 06/10/2020/2:05:13 PM    Final    DG Chest 2 View  Result Date: 04/09/2021 CLINICAL DATA:  Cough for 6 weeks. EXAM: CHEST - 2 VIEW COMPARISON:  March 20, 2013 FINDINGS: The cardiomediastinal silhouette is stable. The heart size remains borderline to mildly enlarged. No pneumothorax. No nodules or masses. No focal infiltrates. No overt edema. IMPRESSION: No active cardiopulmonary disease. Electronically Signed   By: Dorise Bullion III M.D.   On: 04/09/2021 07:13     Assessment & Plan:   Shey was seen today for anemia and cough.  Diagnoses and all orders for this visit:  Subacute cough- His chest x-ray is negative for mass or  infiltrate.  Will improve his treatment for asthma by starting montelukast. -     DG Chest 2 View; Future  Mild intermittent asthma without complication -     montelukast (SINGULAIR) 10 MG tablet; Take 1 tablet (10 mg  total) by mouth at bedtime.  Essential hypertension- His BP is well controlled.  Type II diabetes mellitus with manifestations (HCC)  Iron deficiency anemia due to chronic blood loss- His H&H and iron level are lower.  I have referred him to receive iron infusions and for a GI work-up. 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  I have asked him to be more compliant with the monthly B12 injections. -     CBC with Differential/Platelet; Future -     Folate; Future -     cyanocobalamin ((VITAMIN B-12)) injection 1,000 mcg -     Folate -     CBC with Differential/Platelet   I have discontinued Jagar C. Tennant "Mike"'s ACCRUFeR. I am also having him start on montelukast. Additionally, I am having him maintain his methylcellulose, fluticasone, esomeprazole, dabigatran, Symbicort, levalbuterol, diltiazem, and losartan. We administered cyanocobalamin.  Meds ordered this encounter  Medications   montelukast (SINGULAIR) 10 MG tablet    Sig: Take 1 tablet (10 mg total) by mouth at bedtime.    Dispense:  90 tablet    Refill:  1   cyanocobalamin ((VITAMIN B-12)) injection 1,000 mcg     Follow-up: Return in about 3 months (around 07/05/2021).  Scarlette Calico, MD

## 2021-04-07 NOTE — Patient Instructions (Signed)
Anemia °Anemia is a condition in which there is not enough red blood cells or hemoglobin in the blood. Hemoglobin is a substance in red blood cells that carries oxygen. °When you do not have enough red blood cells or hemoglobin (are anemic), your body cannot get enough oxygen and your organs may not work properly. As a result, you may feel very tired or have other problems. °What are the causes? °Common causes of anemia include: °Excessive bleeding. Anemia can be caused by excessive bleeding inside or outside the body, including bleeding from the intestines or from heavy menstrual periods in females. °Poor nutrition. °Long-lasting (chronic) kidney, thyroid, and liver disease. °Bone marrow disorders, spleen problems, and blood disorders. °Cancer and treatments for cancer. °HIV (human immunodeficiency virus) and AIDS (acquired immunodeficiency syndrome). °Infections, medicines, and autoimmune disorders that destroy red blood cells. °What are the signs or symptoms? °Symptoms of this condition include: °Minor weakness. °Dizziness. °Headache, or difficulties concentrating and sleeping. °Heartbeats that feel irregular or faster than normal (palpitations). °Shortness of breath, especially with exercise. °Pale skin, lips, and nails, or cold hands and feet. °Indigestion and nausea. °Symptoms may occur suddenly or develop slowly. If your anemia is mild, you may not have symptoms. °How is this diagnosed? °This condition is diagnosed based on blood tests, your medical history, and a physical exam. In some cases, a test may be needed in which cells are removed from the soft tissue inside of a bone and looked at under a microscope (bone marrow biopsy). Your health care provider may also check your stool (feces) for blood and may do additional testing to look for the cause of your bleeding. °Other tests may include: °Imaging tests, such as a CT scan or MRI. °A procedure to see inside your esophagus and stomach (endoscopy). °A  procedure to see inside your colon and rectum (colonoscopy). °How is this treated? °Treatment for this condition depends on the cause. If you continue to lose a lot of blood, you may need to be treated at a hospital. Treatment may include: °Taking supplements of iron, vitamin B12, or folic acid. °Taking a hormone medicine (erythropoietin) that can help to stimulate red blood cell growth. °Having a blood transfusion. This may be needed if you lose a lot of blood. °Making changes to your diet. °Having surgery to remove your spleen. °Follow these instructions at home: °Take over-the-counter and prescription medicines only as told by your health care provider. °Take supplements only as told by your health care provider. °Follow any diet instructions that you were given by your health care provider. °Keep all follow-up visits as told by your health care provider. This is important. °Contact a health care provider if: °You develop new bleeding anywhere in the body. °Get help right away if: °You are very weak. °You are short of breath. °You have pain in your abdomen or chest. °You are dizzy or feel faint. °You have trouble concentrating. °You have bloody stools, black stools, or tarry stools. °You vomit repeatedly or you vomit up blood. °These symptoms may represent a serious problem that is an emergency. Do not wait to see if the symptoms will go away. Get medical help right away. Call your local emergency services (911 in the [place] Do not drive yourself to the hospital. °Summary °Anemia is a condition in which you do not have enough red blood cells or enough of a substance in your red blood cells that carries oxygen (hemoglobin). °Symptoms may occur suddenly or develop slowly. °If your anemia is   mild, you may not have symptoms. °This condition is diagnosed with blood tests, a medical history, and a physical exam. Other tests may be needed. °Treatment for this condition depends on the cause of the anemia. °This  information is not intended to replace advice given to you by your health care provider. Make sure you discuss any questions you have with your health care provider. °Document Revised: 01/22/2019 Document Reviewed: 01/22/2019 °Elsevier Patient Education © 2022 [employer] ° °

## 2021-04-08 ENCOUNTER — Encounter: Payer: Self-pay | Admitting: Internal Medicine

## 2021-04-12 DIAGNOSIS — R7303 Prediabetes: Secondary | ICD-10-CM | POA: Diagnosis not present

## 2021-04-12 DIAGNOSIS — H40013 Open angle with borderline findings, low risk, bilateral: Secondary | ICD-10-CM | POA: Diagnosis not present

## 2021-04-12 DIAGNOSIS — H5201 Hypermetropia, right eye: Secondary | ICD-10-CM | POA: Diagnosis not present

## 2021-04-12 DIAGNOSIS — H524 Presbyopia: Secondary | ICD-10-CM | POA: Diagnosis not present

## 2021-04-12 DIAGNOSIS — Z83511 Family history of glaucoma: Secondary | ICD-10-CM | POA: Diagnosis not present

## 2021-04-12 DIAGNOSIS — H5212 Myopia, left eye: Secondary | ICD-10-CM | POA: Diagnosis not present

## 2021-04-12 DIAGNOSIS — H25813 Combined forms of age-related cataract, bilateral: Secondary | ICD-10-CM | POA: Diagnosis not present

## 2021-04-12 DIAGNOSIS — H52223 Regular astigmatism, bilateral: Secondary | ICD-10-CM | POA: Diagnosis not present

## 2021-04-12 LAB — HM DIABETES EYE EXAM

## 2021-04-29 DIAGNOSIS — R972 Elevated prostate specific antigen [PSA]: Secondary | ICD-10-CM | POA: Diagnosis not present

## 2021-04-29 DIAGNOSIS — R3914 Feeling of incomplete bladder emptying: Secondary | ICD-10-CM | POA: Diagnosis not present

## 2021-05-05 ENCOUNTER — Ambulatory Visit: Payer: Medicare Other | Admitting: Adult Health

## 2021-05-06 ENCOUNTER — Telehealth: Payer: Self-pay

## 2021-05-06 DIAGNOSIS — R110 Nausea: Secondary | ICD-10-CM | POA: Diagnosis not present

## 2021-05-06 DIAGNOSIS — Z86010 Personal history of colonic polyps: Secondary | ICD-10-CM | POA: Diagnosis not present

## 2021-05-06 DIAGNOSIS — R140 Abdominal distension (gaseous): Secondary | ICD-10-CM | POA: Diagnosis not present

## 2021-05-06 DIAGNOSIS — I480 Paroxysmal atrial fibrillation: Secondary | ICD-10-CM | POA: Diagnosis not present

## 2021-05-06 DIAGNOSIS — R143 Flatulence: Secondary | ICD-10-CM | POA: Diagnosis not present

## 2021-05-06 DIAGNOSIS — R109 Unspecified abdominal pain: Secondary | ICD-10-CM | POA: Diagnosis not present

## 2021-05-06 NOTE — Telephone Encounter (Signed)
? ?  Pre-operative Risk Assessment  ?  ?Patient Name: Brian Myers  ?DOB: 1946/01/30 ?MRN: [id number]  ? ?  ? ?Request for Surgical Clearance   ? ?Procedure:   Colonoscopy ? ?Date of Surgery:  Clearance 08/25/21                              ?   ?Surgeon:  Dr Paulita Fujita ?Surgeon's Group or Practice Name:  [facility] ?Phone number:  [phone] ?Fax number:  [phone] ?  ?Type of Clearance Requested:   ?- Medical  ?- Pharmacy:  Hold Dabigatran (Pradaxa) hold for 4 days  ?  ?Type of Anesthesia:   Propofol ?  ?Additional requests/questions:   N/A ? ?Signed, ?Ulice Brilliant T   ?05/06/2021, 4:48 PM  ? ?

## 2021-05-07 ENCOUNTER — Telehealth: Payer: Self-pay | Admitting: *Deleted

## 2021-05-07 NOTE — Telephone Encounter (Signed)
Patient with diagnosis of afib on Pradaxa for anticoagulation.   ? ?Procedure: colonoscopy ?Date of procedure: 08/25/21 ? ?CHA2DS2-VASc Score = 3  ?This indicates a 3.2% annual risk of stroke. ?The patient's score is based upon: ?CHF History: 0 ?HTN History: 1 ?Diabetes History: 1 ?Stroke History: 0 ?Vascular Disease History: 0 ?Age Score: 1 ?Gender Score: 0 ?  ?CrCl 8m/min using adjusted body weight due to obesity ?Platelet count 248K ? ?Request is to hold Pradaxa for 4 days, hold that long is not required since pt is on DOAC and has normal renal function. Per office protocol, patient can hold Pradaxa for 1-2 days prior to procedure.   ?

## 2021-05-07 NOTE — Telephone Encounter (Signed)
Pt agreeable to plan of care for tele pre op appt with Almyra Deforest, [facility] 05/12/21 @ 4 pm, med rec and consent done.  ?

## 2021-05-07 NOTE — Telephone Encounter (Signed)
? ? ?  Name: Brian Myers  ?DOB: Aug 19, 1945  ?MRN: [id number] ? ?Primary Cardiologist: Sinclair Grooms, MD ? ?Preoperative team, please contact this patient and set up a phone call appointment for further preoperative risk assessment. Please obtain consent and complete medication review. Thank you for your help. ? ? ?Ledora Bottcher, PA-C ?05/07/2021, 12:53 PM ?[phone] ?[place] ?[address] ?[place], [place] [address] ? ? ?

## 2021-05-07 NOTE — Telephone Encounter (Signed)
?  Patient Consent for Virtual Visit  ? ? ?   ? ?Brian Myers has provided verbal consent on 05/07/2021 for a virtual visit (video or telephone). ? ? ?[name]  ?By participating in this virtual visit I agree to the following: ? ?I hereby voluntarily request, consent and authorize [facility] and its employed or contracted physicians, physician assistants, nurse practitioners or other licensed health care professionals (the Practitioner), to provide me with telemedicine health care services (the ?Services") as deemed necessary by the treating Practitioner. I acknowledge and consent to receive the Services by the Practitioner via telemedicine. I understand that the telemedicine visit will involve communicating with the Practitioner through live audiovisual communication technology and the disclosure of certain medical information by electronic transmission. I acknowledge that I have been given the opportunity to request an in-person assessment or other available alternative prior to the telemedicine visit and am voluntarily participating in the telemedicine visit. ? ?I understand that I have the right to withhold or withdraw my consent to the use of telemedicine in the course of my care at any time, without affecting my right to future care or treatment, and that the Practitioner or I may terminate the telemedicine visit at any time. I understand that I have the right to inspect all information obtained and/or recorded in the course of the telemedicine visit and may receive copies of available information for a reasonable fee.  I understand that some of the potential risks of receiving the Services via telemedicine include:  ?Delay or interruption in medical evaluation due to technological equipment failure or disruption; ?Information transmitted may not be sufficient (e.g. poor resolution of images) to allow for appropriate medical decision making by the Practitioner;  and/or  ?In rare instances, security protocols could fail, causing a breach of personal health information. ? ?Furthermore, I acknowledge that it is my responsibility to provide information about my medical history, conditions and care that is complete and accurate to the best of my ability. I acknowledge that Practitioner's advice, recommendations, and/or decision may be based on factors not within their control, such as incomplete or inaccurate data provided by me or distortions of diagnostic images or specimens that may result from electronic transmissions. I understand that the practice of medicine is not an exact science and that Practitioner makes no warranties or guarantees regarding treatment outcomes. I acknowledge that a copy of this consent can be made available to me via my patient portal ([facility]), or I can request a printed copy by calling the office of [facility].   ? ?I understand that my insurance will be billed for this visit.  ? ?I have read or had this consent read to me. ?I understand the contents of this consent, which adequately explains the benefits and risks of the Services being provided via telemedicine.  ?I have been provided ample opportunity to ask questions regarding this consent and the Services and have had my questions answered to my satisfaction. ?I give my informed consent for the services to be provided through the use of telemedicine in my medical care ? ? ? ?

## 2021-05-12 ENCOUNTER — Other Ambulatory Visit: Payer: Self-pay

## 2021-05-12 ENCOUNTER — Ambulatory Visit (INDEPENDENT_AMBULATORY_CARE_PROVIDER_SITE_OTHER): Payer: Medicare Other | Admitting: Physician Assistant

## 2021-05-12 DIAGNOSIS — Z01810 Encounter for preprocedural cardiovascular examination: Secondary | ICD-10-CM

## 2021-05-12 NOTE — Progress Notes (Signed)
? ?Virtual Visit via Telephone Note  ? ?This visit type was conducted due to national recommendations for restrictions regarding the COVID-19 Pandemic (e.g. social distancing) in an effort to limit this patient's exposure and mitigate transmission in our community.  Due to his co-morbid illnesses, this patient is at least at moderate risk for complications without adequate follow up.  This format is felt to be most appropriate for this patient at this time.  The patient did not have access to video technology/had technical difficulties with video requiring transitioning to audio format only (telephone).  All issues noted in this document were discussed and addressed.  No physical exam could be performed with this format.  Please refer to the patient's chart for his  consent to telehealth for [facility]. ?Evaluation Performed:  Preoperative cardiovascular risk assessment ? ?This visit type was conducted due to national recommendations for restrictions regarding the COVID-19 Pandemic (e.g. social distancing).  This format is felt to be most appropriate for this patient at this time.  All issues noted in this document were discussed and addressed.  No physical exam was performed (except for noted visual exam findings with Video Visits).  Please refer to the patient's chart (MyChart message for video visits and phone note for telephone visits) for the patient's consent to telehealth for [facility]. ?_____________  ? ?Date:  05/12/2021  ? ?Patient ID:  Brian Myers, Brian Myers 03-31-1945, MRN [id number] ?Patient Location:  ?Home ?Provider location:   ?Office ? ?Primary Care Provider:  Janith Lima, MD ?Primary Cardiologist:  Sinclair Grooms, MD ? ?Chief Complaint  ?  ?76 y.o. y/o male with a h/o morbid obesity, EtOH abuse, obstructive sleep apnea and persistent atrial fibrillation, who is pending colonoscopy, and presents today for telephonic preoperative cardiovascular risk assessment. ? ?Past Medical History   ?  ?Past Medical History:  ?Diagnosis Date  ? Arthritis   ? KNEES  ? Asthma   ? BPH (benign prostatic hypertrophy)   ? DIFFICULTY GETTING STREAM STARTED  ? Eczema   ? HANDS  ? GERD (gastroesophageal reflux disease)   ? H/O hiatal hernia   ? Hyperlipidemia   ? Hyperlipidemia   ? DOES NOT TOLERATE ANY OF THE STATINS  ? Persistent atrial fibrillation ([place])   ? Dr Tamala Julian  ? Pneumonia   ? LAST EPISODE JAN 2015  ? Right knee meniscal tear   ? PAIN RT KNEE  ? Sleep apnea   ? with cpap  ? ?Past Surgical History:  ?Procedure Laterality Date  ? CARDIOVERSION N/A 06/10/2020  ? Procedure: CARDIOVERSION;  Surgeon: Elouise Munroe, MD;  Location: [facility] ENDOSCOPY;  Service: Cardiovascular;  Laterality: N/A;  ? [place]  ? KNEE ARTHROSCOPY Right 04/17/2013  ? Procedure: ARTHROSCOPY RIGHT KNEE WITH MEDIAL AND LATERAL DEBRIDEMENT AND CONDRAPLASTY;  Surgeon: Gearlean Alf, MD;  Location: [facility] ORS;  Service: Orthopedics;  Laterality: Right;  ? LEFT KNEE ARTHROSCPY  2009  ? TEE WITHOUT CARDIOVERSION N/A 06/10/2020  ? Procedure: TRANSESOPHAGEAL ECHOCARDIOGRAM (TEE);  Surgeon: Elouise Munroe, MD;  Location: [place];  Service: Cardiovascular;  Laterality: N/A;  ? ? ?Allergies ? ?Allergies  ?Allergen Reactions  ? Penicillins Anaphylaxis  ? Sulfa Drugs Cross Reactors Anaphylaxis  ? Statins Other (See Comments)  ?  INTOLERANT TO STATINS - SEVERE MUSCLE CRAMPS  ? Omeprazole Other (See Comments)  ? ? ?History of Present Illness  ?  ?Brian Myers is a 76 y.o. male who presents via audio/video  conferencing for a telehealth visit today.  Pt was last seen in cardiology clinic on 02/05/2021, by Dr. Rayann Heman.  At that time Brian Myers was doing well, his diltiazem was increased to help control the heart rate.  he is now pending colonoscopy.  Since his last visit, he has been doing well without exertional chest pain or worsening dyspnea.  Heart rate is fairly controlled after the diltiazem was increased.  He is tolerating  the higher dose of the diltiazem without any issue. ? ? ?Home Medications  ?  ?Prior to Admission medications   ?Medication Sig Start Date End Date Taking? Authorizing Provider  ?dabigatran (PRADAXA) 150 MG CAPS capsule TAKE 1 CAPSULE BY MOUTH EVERY 12 HOURS 07/08/20   Belva Crome, MD  ?diltiazem (CARDIZEM CD) 360 MG 24 hr capsule Take 1 capsule (360 mg total) by mouth daily. 02/05/21   Allred, Jeneen Rinks, MD  ?esomeprazole (NEXIUM) 20 MG capsule Take 20 mg by mouth 2 (two) times daily. 10/25/15   [provider]  ?fluticasone (FLONASE) 50 MCG/ACT nasal spray Place 1 spray into both nostrils 2 (two) times a day.    [provider]  ?levalbuterol (XOPENEX HFA) 45 MCG/ACT inhaler Inhale 1 puff into the lungs every 6 (six) hours as needed for wheezing. 01/04/21   Janith Lima, MD  ?losartan (COZAAR) 50 MG tablet TAKE 1 TABLET BY MOUTH EVERY DAY 02/26/21   Allred, Jeneen Rinks, MD  ?methylcellulose (ARTIFICIAL TEARS) 1 % ophthalmic solution Place 1 drop into both eyes 2 (two) times daily as needed (dry eyes).    [provider]  ?montelukast (SINGULAIR) 10 MG tablet Take 1 tablet (10 mg total) by mouth at bedtime. 04/07/21   Janith Lima, MD  ?[facility] 160-4.5 MCG/ACT inhaler SMARTSIG:2 Puff(s) By Mouth Twice Daily 01/04/21   Janith Lima, MD  ? ? ?Physical Exam  ?  ?Vital Signs:  Brian Myers does not have vital signs available for review today. ? ?Given telephonic nature of communication, physical exam is limited. ?AAOx3. NAD. Normal affect.  Speech and respirations are unlabored. ? ?Accessory Clinical Findings  ?  ?None ? ?Assessment & Plan  ?  ?1.  Preoperative Cardiovascular Risk Assessment: ? -Patient has upcoming colonoscopy.  The initial request was to hold the Pradaxa for 4 days, however based on his renal function and his age, our clinical pharmacist recommend holding the Pradaxa for 2 days instead to decrease the stroke risk.  Otherwise, he denies any recent chest pain or worsening  dyspnea, he may proceed with colonoscopy procedure.  We will defer to the GI doctor to decide on the earliest time to restart on Pradaxa after the procedure. ? ? ?COVID-19 Education: ?The signs and symptoms of COVID-19 were discussed with the patient and how to seek care for testing (follow up with PCP or arrange E-visit).  The importance of social distancing was discussed today. ? ?Patient Risk:   ?After full review of this patient's history and clinical status, I feel that he is at least moderate risk for cardiac complications at this time, thus necessitating a telehealth visit sooner than our first available in office visit. ? ?Time:   ?Today, I have spent 4 minutes with the patient with telehealth technology discussing medical history, symptoms, and management plan.   ? ? ?Almyra Deforest, [place] ? ?05/12/2021, 4:21 PM ? ?

## 2021-05-13 DIAGNOSIS — R972 Elevated prostate specific antigen [PSA]: Secondary | ICD-10-CM | POA: Diagnosis not present

## 2021-05-13 DIAGNOSIS — N139 Obstructive and reflux uropathy, unspecified: Secondary | ICD-10-CM | POA: Diagnosis not present

## 2021-05-13 DIAGNOSIS — N323 Diverticulum of bladder: Secondary | ICD-10-CM | POA: Diagnosis not present

## 2021-05-13 DIAGNOSIS — N401 Enlarged prostate with lower urinary tract symptoms: Secondary | ICD-10-CM | POA: Diagnosis not present

## 2021-05-13 DIAGNOSIS — R3914 Feeling of incomplete bladder emptying: Secondary | ICD-10-CM | POA: Diagnosis not present

## 2021-05-20 ENCOUNTER — Other Ambulatory Visit: Payer: Self-pay | Admitting: Gastroenterology

## 2021-05-20 DIAGNOSIS — Z1211 Encounter for screening for malignant neoplasm of colon: Secondary | ICD-10-CM

## 2021-06-01 ENCOUNTER — Encounter: Payer: Self-pay | Admitting: Adult Health

## 2021-06-01 ENCOUNTER — Ambulatory Visit (INDEPENDENT_AMBULATORY_CARE_PROVIDER_SITE_OTHER): Payer: Medicare Other | Admitting: Adult Health

## 2021-06-01 VITALS — BP 142/84 | HR 81 | Ht 71.0 in | Wt 272.0 lb

## 2021-06-01 DIAGNOSIS — Z9989 Dependence on other enabling machines and devices: Secondary | ICD-10-CM | POA: Diagnosis not present

## 2021-06-01 DIAGNOSIS — G4733 Obstructive sleep apnea (adult) (pediatric): Secondary | ICD-10-CM

## 2021-06-01 NOTE — Progress Notes (Deleted)
? ?PCP:  Janith Lima, MD ?Primary Cardiologist: Sinclair Grooms, MD ?Electrophysiologist: Thompson Grayer, MD  ? ?Brian Myers is a 76 y.o. male seen today for Thompson Grayer, MD for routine electrophysiology followup.  Since last being seen in our clinic the patient reports doing ***.  he denies chest pain, palpitations, dyspnea, PND, orthopnea, nausea, vomiting, dizziness, syncope, edema, weight gain, or early satiety. ? ?Past Medical History:  ?Diagnosis Date  ? Arthritis   ? KNEES  ? Asthma   ? BPH (benign prostatic hypertrophy)   ? DIFFICULTY GETTING STREAM STARTED  ? Eczema   ? HANDS  ? GERD (gastroesophageal reflux disease)   ? H/O hiatal hernia   ? Hyperlipidemia   ? Hyperlipidemia   ? DOES NOT TOLERATE ANY OF THE STATINS  ? Persistent atrial fibrillation ([place])   ? Dr Tamala Julian  ? Pneumonia   ? LAST EPISODE JAN 2015  ? Right knee meniscal tear   ? PAIN RT KNEE  ? Sleep apnea   ? with cpap  ? ?Past Surgical History:  ?Procedure Laterality Date  ? CARDIOVERSION N/A 06/10/2020  ? Procedure: CARDIOVERSION;  Surgeon: Elouise Munroe, MD;  Location: [facility] ENDOSCOPY;  Service: Cardiovascular;  Laterality: N/A;  ? [place]  ? KNEE ARTHROSCOPY Right 04/17/2013  ? Procedure: ARTHROSCOPY RIGHT KNEE WITH MEDIAL AND LATERAL DEBRIDEMENT AND CONDRAPLASTY;  Surgeon: Gearlean Alf, MD;  Location: [facility];  Service: Orthopedics;  Laterality: Right;  ? LEFT KNEE ARTHROSCPY  2009  ? TEE WITHOUT CARDIOVERSION N/A 06/10/2020  ? Procedure: TRANSESOPHAGEAL ECHOCARDIOGRAM (TEE);  Surgeon: Elouise Munroe, MD;  Location: [place];  Service: Cardiovascular;  Laterality: N/A;  ? ? ?Current Outpatient Medications  ?Medication Sig Dispense Refill  ? dabigatran (PRADAXA) 150 MG CAPS capsule TAKE 1 CAPSULE BY MOUTH EVERY 12 HOURS 180 capsule 3  ? diltiazem (CARDIZEM CD) 360 MG 24 hr capsule Take 1 capsule (360 mg total) by mouth daily. 90 capsule 3  ? esomeprazole (NEXIUM) 20 MG capsule Take 20 mg by mouth 2 (two)  times daily.  1  ? fluticasone (FLONASE) 50 MCG/ACT nasal spray Place 1 spray into both nostrils 2 (two) times a day.    ? levalbuterol (XOPENEX HFA) 45 MCG/ACT inhaler Inhale 1 puff into the lungs every 6 (six) hours as needed for wheezing. 45 g 2  ? losartan (COZAAR) 50 MG tablet TAKE 1 TABLET BY MOUTH EVERY DAY 90 tablet 3  ? methylcellulose (ARTIFICIAL TEARS) 1 % ophthalmic solution Place 1 drop into both eyes 2 (two) times daily as needed (dry eyes).    ? montelukast (SINGULAIR) 10 MG tablet Take 1 tablet (10 mg total) by mouth at bedtime. 90 tablet 1  ? SYMBICORT 160-4.5 MCG/ACT inhaler SMARTSIG:2 Puff(s) By Mouth Twice Daily 3 each 1  ? ?No current facility-administered medications for this visit.  ? ? ?Allergies  ?Allergen Reactions  ? Penicillins Anaphylaxis  ? Sulfa Drugs Cross Reactors Anaphylaxis  ? Statins Other (See Comments)  ?  INTOLERANT TO STATINS - SEVERE MUSCLE CRAMPS  ? Omeprazole Other (See Comments)  ? ? ?Social History  ? ?Socioeconomic History  ? Marital status: Married  ?  Spouse name: Not on file  ? Number of children: 2  ? Years of education: Not on file  ? Highest education level: Not on file  ?Occupational History  ?  Employer: [employer]  ?Tobacco Use  ? Smoking status: Never  ? Smokeless  tobacco: Never  ?Vaping Use  ? Vaping Use: Never used  ?Substance and Sexual Activity  ? Alcohol use: Yes  ?  Comment: at least 2-3 glasses of wine per day  ? Drug use: No  ? Sexual activity: Not on file  ?Other Topics Concern  ? Not on file  ?Social History Narrative  ? Lives in [place] with spouse.  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  ? ?Social Determinants of Health  ? ?Financial Resource Strain: Not on file  ?Food Insecurity: Not on file  ?Transportation Needs: Not on file  ?Physical Activity: Not on file  ?Stress: Not on file  ?Social Connections: Not on file  ?Intimate Partner Violence: Not on file  ? ? ? ?Review of Systems: ?All other systems reviewed and are otherwise negative  except as noted above. ? ?Physical Exam: ?There were no vitals filed for this visit. ? ?GEN- The patient is well appearing, alert and oriented x 3 today.   ?HEENT: normocephalic, atraumatic; sclera clear, conjunctiva pink; hearing intact; oropharynx clear; neck supple, no JVP ?Lymph- no cervical lymphadenopathy ?Lungs- Clear to ausculation bilaterally, normal work of breathing.  No wheezes, rales, rhonchi ?Heart- Regular rate and rhythm, no murmurs, rubs or gallops, PMI not laterally displaced ?GI- soft, non-tender, non-distended, bowel sounds present, no hepatosplenomegaly ?Extremities- no clubbing, cyanosis, or edema; DP/PT/radial pulses 2+ bilaterally ?MS- no significant deformity or atrophy ?Skin- warm and dry, no rash or lesion ?Psych- euthymic mood, full affect ?Neuro- strength and sensation are intact ? ?EKG is ordered. Personal review of EKG from today shows *** ? ?Additional studies reviewed include: ?Previous EP office notes. *** ? ?Assessment and Plan: ? ?Persistent afib/ atrial flutter ?Doing well overall with rate control ?He is on pradaxa for stroke prevention ?Continue diltiazem CD '360mg'$  daily ?We discussed tikosyn and ablation as options today.  He would like to continue rate control at his time. ?  ?2. Morbid obesity ?There is no height or weight on file to calculate BMI.  ?Lifestyle modification advised ?  ?3. ETOH ?Drinks heavily ?Reduction advised ?  ?4. OSA on CPAP ?Encouraged nightly CPAP ? ?Follow up with {Blank single:19197::"Dr. Allred","Dr. Arlan Organ. Klein","Dr. Camnitz","Dr. Lambert","EP APP"} in {Blank single:19197::"2 weeks","4 weeks","3 months","6 months","12 months","as usual post gen change"}  ? ?Shirley Friar, PA-C  ?06/01/21 ?9:21 AM  ?

## 2021-06-01 NOTE — Progress Notes (Signed)
? ? ?PATIENT: Brian Myers ?DOB: 1945-09-22 ? ?REASON FOR VISIT: follow up ?HISTORY FROM: patient ? ?Chief Complaint  ?Patient presents with  ? RM 19  ?  Here alone. ESS 4. Denies problems with CPAP.  ? ? ?HISTORY OF PRESENT ILLNESS: ?Today 06/01/21: ? ?Mr. Steiner is a 76 year old male with a history of obstructive sleep apnea on CPAP.  He returns today for follow-up.  He reports that the CPAP is working well for him.  He denies any new issues.  In the past we did order a CPAP titration due to an abnormal overnight pulse oximetry however the patient deferred then.  When asked about it now he still does not wish to complete this test ? ? ? ? ?05/04/20: Mr. Shillingburg is a 76 year old male with a history of obstructive sleep apnea on CPAP.  He returns today for follow-up.  The patient was scheduled for a CPAP titration to confirm overnight pulse oximetry results.  The patient canceled this.  He does not want to proceed with this at this time.  He states that he does not sleep well when he comes and to the office for the studies.  Overall he states that the CPAP continues to work well for him.  He denies any significant issues. ? ? ? ?HISTORY 07/01/19:  ?Mr. Swiderski is a 76 year old male with a history of obstructive sleep apnea on CPAP.  He joins me today for a virtual visit.  The patient wanted to review his overnight pulse oximetry in more detail before scheduling CPAP titration.  I advised that his overnight pulse oximetry showed that he was less than 88% for approximately 4 hours.  This would qualify him for nocturnal oxygen but this should be verified with a CPAP titration.  Patient voiced understanding.  He is ready to schedule CPAP titration ? ? ?REVIEW OF SYSTEMS: Out of a complete 14 system review of symptoms, the patient complains only of the following symptoms, and all other reviewed systems are negative. ? ?FSS 20 ?ESS 4 ? ?ALLERGIES: ?Allergies  ?Allergen Reactions  ? Penicillins Anaphylaxis  ? Sulfa Drugs  Cross Reactors Anaphylaxis  ? Statins Other (See Comments)  ?  INTOLERANT TO STATINS - SEVERE MUSCLE CRAMPS  ? Omeprazole Other (See Comments)  ? ? ?HOME MEDICATIONS: ?Outpatient Medications Prior to Visit  ?Medication Sig Dispense Refill  ? dabigatran (PRADAXA) 150 MG CAPS capsule TAKE 1 CAPSULE BY MOUTH EVERY 12 HOURS 180 capsule 3  ? diltiazem (CARDIZEM CD) 360 MG 24 hr capsule Take 1 capsule (360 mg total) by mouth daily. 90 capsule 3  ? esomeprazole (NEXIUM) 20 MG capsule Take 20 mg by mouth 2 (two) times daily.  1  ? fluticasone (FLONASE) 50 MCG/ACT nasal spray Place 1 spray into both nostrils 2 (two) times a day.    ? levalbuterol (XOPENEX HFA) 45 MCG/ACT inhaler Inhale 1 puff into the lungs every 6 (six) hours as needed for wheezing. 45 g 2  ? losartan (COZAAR) 50 MG tablet TAKE 1 TABLET BY MOUTH EVERY DAY 90 tablet 3  ? methylcellulose (ARTIFICIAL TEARS) 1 % ophthalmic solution Place 1 drop into both eyes 2 (two) times daily as needed (dry eyes).    ? SYMBICORT 160-4.5 MCG/ACT inhaler SMARTSIG:2 Puff(s) By Mouth Twice Daily 3 each 1  ? montelukast (SINGULAIR) 10 MG tablet Take 1 tablet (10 mg total) by mouth at bedtime. (Patient not taking: Reported on 06/01/2021) 90 tablet 1  ? ?No facility-administered medications prior to visit.  ? ? ?  PAST MEDICAL HISTORY: ?Past Medical History:  ?Diagnosis Date  ? Arthritis   ? KNEES  ? Asthma   ? Bladder problem   ? enlarged bladder  ? BPH (benign prostatic hypertrophy)   ? DIFFICULTY GETTING STREAM STARTED  ? Eczema   ? HANDS  ? GERD (gastroesophageal reflux disease)   ? H/O hiatal hernia   ? Hyperlipidemia   ? Hyperlipidemia   ? DOES NOT TOLERATE ANY OF THE STATINS  ? Persistent atrial fibrillation ([place])   ? Dr Tamala Julian  ? Pneumonia   ? LAST EPISODE JAN 2015  ? Right knee meniscal tear   ? PAIN RT KNEE  ? Sleep apnea   ? with cpap  ? ? ?PAST SURGICAL HISTORY: ?Past Surgical History:  ?Procedure Laterality Date  ? CARDIOVERSION N/A 06/10/2020  ? Procedure: CARDIOVERSION;   Surgeon: Elouise Munroe, MD;  Location: [facility] ENDOSCOPY;  Service: Cardiovascular;  Laterality: N/A;  ? [place]  ? KNEE ARTHROSCOPY Right 04/17/2013  ? Procedure: ARTHROSCOPY RIGHT KNEE WITH MEDIAL AND LATERAL DEBRIDEMENT AND CONDRAPLASTY;  Surgeon: Gearlean Alf, MD;  [facility];  Service: Orthopedics;  Laterality: Right;  ? LEFT KNEE ARTHROSCPY  2009  ? TEE WITHOUT CARDIOVERSION N/A 06/10/2020  ? Procedure: TRANSESOPHAGEAL ECHOCARDIOGRAM (TEE);  Surgeon: Elouise Munroe, MD;  Location: [facility] ENDOSCOPY;  Service: Cardiovascular;  Laterality: N/A;  ? ? ?FAMILY HISTORY: ?Family History  ?Problem Relation Age of Onset  ? Arrhythmia Mother   ?     atrial fib  ? Mitral valve prolapse Mother   ? Hypertension Father   ? ? ?SOCIAL HISTORY: ?Social History  ? ?Socioeconomic History  ? Marital status: Married  ?  Spouse name: Not on file  ? Number of children: 2  ? Years of education: Not on file  ? Highest education level: Not on file  ?Occupational History  ?  Employer: [employer]  ?Tobacco Use  ? Smoking status: Never  ? Smokeless tobacco: Never  ?Vaping Use  ? Vaping Use: Never used  ?Substance and Sexual Activity  ? Alcohol use: Yes  ?  Alcohol/week: 14.0 - 21.0 standard drinks  ?  Types: 14 - 21 Glasses of wine per week  ?  Comment: at least 2-3 glasses of wine per day  ? Drug use: No  ? Sexual activity: Not on file  ?Other Topics Concern  ? Not on file  ?Social History Narrative  ? Lives in [place] with spouse.  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  ? Right handed  ? Caffeine: tea 1 cup/day  ? ?Social Determinants of Health  ? ?Financial Resource Strain: Not on file  ?Food Insecurity: Not on file  ?Transportation Needs: Not on file  ?Physical Activity: Not on file  ?Stress: Not on file  ?Social Connections: Not on file  ?Intimate Partner Violence: Not on file  ? ? ? ? ?PHYSICAL EXAM ? ?Vitals:  ? 06/01/21 1433  ?BP: (!) 142/84  ?Pulse: 81  ?Weight: 272 lb (123.4 kg)  ?Height: 5'  11" (1.803 m)  ? ?Body mass index is 37.94 kg/m?. ? ?Generalized: Well developed, in no acute distress  ?Chest: Lungs clear to auscultation bilaterally ? ?Neurological examination  ?Mentation: Alert oriented to time, place, history taking. Follows all commands speech and language fluent ?Cranial nerve II-XII: Extraocular movements were full, visual field were full on confrontational test Head turning and shoulder shrug  were normal and symmetric. ?Motor: The motor testing  reveals 5 over 5 strength of all 4 extremities. Good symmetric motor tone is noted throughout.  ?Sensory: Sensory testing is intact to soft touch on all 4 extremities. No evidence of extinction is noted.  ?Gait and station: Gait is normal.  ? ? ?DIAGNOSTIC DATA (LABS, IMAGING, TESTING) ?- I reviewed patient records, labs, notes, testing and imaging myself where available. ? ?Lab Results  ?Component Value Date  ? WBC 7.1 04/07/2021  ? HGB 11.0 (L) 04/07/2021  ? HCT 35.3 (L) 04/07/2021  ? MCV 81.5 04/07/2021  ? PLT 248.0 04/07/2021  ? ?   ?Component Value Date/Time  ? NA 136 01/04/2021 1441  ? NA 136 06/03/2020 1555  ? K 4.0 01/04/2021 1441  ? CL 100 01/04/2021 1441  ? CO2 27 01/04/2021 1441  ? GLUCOSE 98 01/04/2021 1441  ? BUN 13 01/04/2021 1441  ? BUN 15 06/03/2020 1555  ? CREATININE 0.88 01/04/2021 1441  ? CREATININE 0.98 11/27/2015 1442  ? CALCIUM 8.9 01/04/2021 1441  ? PROT 7.3 01/04/2021 1441  ? ALBUMIN 4.0 01/04/2021 1441  ? AST 87 (H) 01/04/2021 1441  ? ALT 84 (H) 01/04/2021 1441  ? ALKPHOS 54 01/04/2021 1441  ? BILITOT 0.7 01/04/2021 1441  ? GFRNONAA 85 (L) 04/12/2013 1500  ? GFRAA >90 04/12/2013 1500  ? ?Lab Results  ?Component Value Date  ? CHOL 222 (H) 01/04/2021  ? HDL 48.50 01/04/2021  ? LDLCALC 152 (H) 01/04/2021  ? TRIG 105.0 01/04/2021  ? CHOLHDL 5 01/04/2021  ? ?Lab Results  ?Component Value Date  ? HGBA1C 6.5 01/04/2021  ? ?Lab Results  ?Component Value Date  ? VITAMINB12 189 (L) 01/04/2021  ? ?Lab Results  ?Component Value Date  ?  TSH 3.47 01/04/2021  ? ? ? ? ?ASSESSMENT AND PLAN ?76 y.o. year old male  has a past medical history of Arthritis, Asthma, Bladder problem, BPH (benign prostatic hypertrophy), Eczema, GERD (gastroesoph

## 2021-06-07 ENCOUNTER — Ambulatory Visit: Payer: Medicare Other | Admitting: Student

## 2021-06-07 DIAGNOSIS — R3914 Feeling of incomplete bladder emptying: Secondary | ICD-10-CM | POA: Diagnosis not present

## 2021-06-07 DIAGNOSIS — I484 Atypical atrial flutter: Secondary | ICD-10-CM

## 2021-06-07 DIAGNOSIS — I480 Paroxysmal atrial fibrillation: Secondary | ICD-10-CM

## 2021-06-07 DIAGNOSIS — F1010 Alcohol abuse, uncomplicated: Secondary | ICD-10-CM

## 2021-06-07 DIAGNOSIS — G4733 Obstructive sleep apnea (adult) (pediatric): Secondary | ICD-10-CM

## 2021-06-07 NOTE — Progress Notes (Addendum)
Towanda Octave, RN; Bearl Mulberry ?Adding in [place] for assistance since Caryl Pina is on vacation.  ? ?Thanks!  ?Margreta Journey   ? ?  ?Previous Messages ?  ?----- Message -----  ?From: Brandon Melnick, RN  ?Sent: 06/07/2021   1:57 PM EDT  ?To: Ocie Bob  ?Subject: increase in pressure                          ? ?Good afternoon,    ? ?New order in Epic  ? ?Rebeca Alert. Amie Critchley "Ronalee Belts"  ?Male, 76 y.o., December 17, 1945  ?MRN:  ?[id number]  ?Code:  ?Not on file  ? ?Thank you.  ? ?Sandy RN  ?New order sent to [facility] of increase pressure.  ?

## 2021-06-09 NOTE — Progress Notes (Signed)
RE: increase in pressure ?Received: Today ?Burroughs, Marysa  [place], [place]; Brandon Melnick, RN; Marchelle Gearing ?Pressure change completed; left voicemail making him aware of change along with my direct call back number should he have any questions.   ? ?  ?Previous Messages ?  ?----- Message -----  ?From: Vanessa Ralphs  ?Sent: 06/09/2021  10:01 AM EDT  ?To: Bearl Mulberry, *  ?Subject: RE: increase in pressure                      ? ?Adding in [place] for assistance since Caryl Pina is on vacation.  ? ?Thanks!  ?Margreta Journey  ? ?----- Message -----  ?From: Brandon Melnick, RN  ?Sent: 06/07/2021   1:57 PM EDT  ?To: Ocie Bob  ?Subject: increase in pressure                          ? ?Good afternoon,    ? ?New order in Epic  ? ?Rebeca Alert. Amie Critchley "Ronalee Belts"  ?Male, 76 y.o., Jun 20, 1945  ?MRN:  ?[id number]  ?Code:  ?Not on file  ? ?Thank you.  ? ?Sandy RN   ? ?

## 2021-06-21 DIAGNOSIS — R3914 Feeling of incomplete bladder emptying: Secondary | ICD-10-CM | POA: Diagnosis not present

## 2021-06-21 DIAGNOSIS — N401 Enlarged prostate with lower urinary tract symptoms: Secondary | ICD-10-CM | POA: Diagnosis not present

## 2021-06-21 DIAGNOSIS — R972 Elevated prostate specific antigen [PSA]: Secondary | ICD-10-CM | POA: Diagnosis not present

## 2021-06-21 DIAGNOSIS — R8271 Bacteriuria: Secondary | ICD-10-CM | POA: Diagnosis not present

## 2021-06-24 NOTE — Progress Notes (Signed)
? ?PCP:  Janith Lima, MD ?Primary Cardiologist: Sinclair Grooms, MD ?Electrophysiologist: Thompson Grayer, MD  ? ?Brian Myers is a 76 y.o. male seen today for Thompson Grayer, MD for routine electrophysiology followup.  Since last being seen in our clinic the patient reports doing very well from a cardiac perspective. He is pending virtual colonoscopy due to inability to hold pradaxa for 4 days per request of GI. He is frustrated because he has previously had polyps, and if virtual colonoscopy is positive, he will have to do 2 preps.  he denies chest pain, palpitations, dyspnea, PND, orthopnea, nausea, vomiting, dizziness, syncope, edema, weight gain, or early satiety. ? ?Past Medical History:  ?Diagnosis Date  ? Arthritis   ? KNEES  ? Asthma   ? Bladder problem   ? enlarged bladder  ? BPH (benign prostatic hypertrophy)   ? DIFFICULTY GETTING STREAM STARTED  ? Eczema   ? HANDS  ? GERD (gastroesophageal reflux disease)   ? H/O hiatal hernia   ? Hyperlipidemia   ? Hyperlipidemia   ? DOES NOT TOLERATE ANY OF THE STATINS  ? Persistent atrial fibrillation ([place])   ? Dr Tamala Julian  ? Pneumonia   ? LAST EPISODE JAN 2015  ? Right knee meniscal tear   ? PAIN RT KNEE  ? Sleep apnea   ? with cpap  ? ?Past Surgical History:  ?Procedure Laterality Date  ? CARDIOVERSION N/A 06/10/2020  ? Procedure: CARDIOVERSION;  Surgeon: Elouise Munroe, MD;  Location: [facility] ENDOSCOPY;  Service: Cardiovascular;  Laterality: N/A;  ? [place]  ? KNEE ARTHROSCOPY Right 04/17/2013  ? Procedure: ARTHROSCOPY RIGHT KNEE WITH MEDIAL AND LATERAL DEBRIDEMENT AND CONDRAPLASTY;  Surgeon: Gearlean Alf, MD;  Location: [facility] ORS;  Service: Orthopedics;  Laterality: Right;  ? LEFT KNEE ARTHROSCPY  2009  ? TEE WITHOUT CARDIOVERSION N/A 06/10/2020  ? Procedure: TRANSESOPHAGEAL ECHOCARDIOGRAM (TEE);  Surgeon: Elouise Munroe, MD;  Location: [facility];  Service: Cardiovascular;  Laterality: N/A;  ? ? ?Current Outpatient Medications  ?Medication  Sig Dispense Refill  ? dabigatran (PRADAXA) 150 MG CAPS capsule TAKE 1 CAPSULE BY MOUTH EVERY 12 HOURS 180 capsule 3  ? diltiazem (CARDIZEM CD) 360 MG 24 hr capsule Take 1 capsule (360 mg total) by mouth daily. 90 capsule 3  ? doxycycline (MONODOX) 100 MG capsule Take 100 mg by mouth 2 (two) times daily. For one week    ? esomeprazole (NEXIUM) 20 MG capsule Take 20 mg by mouth 2 (two) times daily.  1  ? finasteride (PROSCAR) 5 MG tablet Take 5 mg by mouth daily.    ? fluticasone (FLONASE) 50 MCG/ACT nasal spray Place 1 spray into both nostrils 2 (two) times a day.    ? levalbuterol (XOPENEX HFA) 45 MCG/ACT inhaler Inhale 1 puff into the lungs every 6 (six) hours as needed for wheezing. 45 g 2  ? losartan (COZAAR) 50 MG tablet TAKE 1 TABLET BY MOUTH EVERY DAY 90 tablet 3  ? methylcellulose (ARTIFICIAL TEARS) 1 % ophthalmic solution Place 1 drop into both eyes 2 (two) times daily as needed (dry eyes).    ? montelukast (SINGULAIR) 10 MG tablet Take 1 tablet (10 mg total) by mouth at bedtime. 90 tablet 1  ? SYMBICORT 160-4.5 MCG/ACT inhaler SMARTSIG:2 Puff(s) By Mouth Twice Daily 3 each 1  ? ?No current facility-administered medications for this visit.  ? ? ?Allergies  ?Allergen Reactions  ? Penicillins Anaphylaxis  ? Sulfa Drugs Cross  Reactors Anaphylaxis  ? Statins Other (See Comments)  ?  INTOLERANT TO STATINS - SEVERE MUSCLE CRAMPS  ? Omeprazole Other (See Comments)  ? ? ?Social History  ? ?Socioeconomic History  ? Marital status: Married  ?  Spouse name: Not on file  ? Number of children: 2  ? Years of education: Not on file  ? Highest education level: Not on file  ?Occupational History  ?  Employer: [employer]  ?Tobacco Use  ? Smoking status: Never  ? Smokeless tobacco: Never  ?Vaping Use  ? Vaping Use: Never used  ?Substance and Sexual Activity  ? Alcohol use: Yes  ?  Alcohol/week: 14.0 - 21.0 standard drinks  ?  Types: 14 - 21 Glasses of wine per week  ?  Comment: at least 2-3 glasses of wine per day   ? Drug use: No  ? Sexual activity: Not on file  ?Other Topics Concern  ? Not on file  ?Social History Narrative  ? Lives in [place] with spouse.  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  ? Right handed  ? Caffeine: tea 1 cup/day  ? ?Social Determinants of Health  ? ?Financial Resource Strain: Not on file  ?Food Insecurity: Not on file  ?Transportation Needs: Not on file  ?Physical Activity: Not on file  ?Stress: Not on file  ?Social Connections: Not on file  ?Intimate Partner Violence: Not on file  ? ? ? ?Review of Systems: ?All other systems reviewed and are otherwise negative except as noted above. ? ?Physical Exam: ?Vitals:  ? 06/25/21 0936  ?BP: 130/72  ?Pulse: 82  ?SpO2: 98%  ?Weight: 272 lb (123.4 kg)  ?Height: '5\' 11"'$  (1.803 m)  ? ? ?GEN- The patient is well appearing, alert and oriented x 3 today.   ?HEENT: normocephalic, atraumatic; sclera clear, conjunctiva pink; hearing intact; oropharynx clear; neck supple, no JVP ?Lymph- no cervical lymphadenopathy ?Lungs- Clear to ausculation bilaterally, normal work of breathing.  No wheezes, rales, rhonchi ?Heart- Regular rate and rhythm, no murmurs, rubs or gallops, PMI not laterally displaced ?GI- soft, non-tender, non-distended, bowel sounds present, no hepatosplenomegaly ?Extremities- no clubbing, cyanosis, or edema; DP/PT/radial pulses 2+ bilaterally ?MS- no significant deformity or atrophy ?Skin- warm and dry, no rash or lesion ?Psych- euthymic mood, full affect ?Neuro- strength and sensation are intact ? ?EKG is ordered today. Shows AF with controlled VR in the 18s.  ? ?Additional studies reviewed include: ?Previous EP office notes.  ? ?Assessment and Plan: ? ?Persistent afib/ atrial flutter ?Rate well controlled.  ?He is on pradaxa for stroke prevention ?Continue diltiazem CD 360 mg daily ?Have discussed tikosyn and ablation as options .  He would like to continue rate control at his time. ?  ?2. Morbid obesity ?Body mass index is 37.94 kg/m?.  ?Lifestyle  modification advised ?  ?3. ETOH ?Encouraged reduction and eventual cessation ?  ?4. OSA ?Encouraged nightly CPAP ? ?5. Cardiac Clearance for colonoscopy ?Ok to proceed with low to intermediate risk. ?Dabigatran has a half life of 12-17 hrs in pt with normal renal function. Pt should not hold pradaxa for more than 2 days pre-op.  ? ?Follow up with Dr. Curt Bears in 6 months to establish.  ? ?Lenvil Swaim, PA-C  ?06/25/21 ?9:48 AM.li ?

## 2021-06-25 ENCOUNTER — Ambulatory Visit (INDEPENDENT_AMBULATORY_CARE_PROVIDER_SITE_OTHER): Payer: Medicare Other | Admitting: Student

## 2021-06-25 VITALS — BP 130/72 | HR 82 | Ht 71.0 in | Wt 272.0 lb

## 2021-06-25 DIAGNOSIS — Z9989 Dependence on other enabling machines and devices: Secondary | ICD-10-CM

## 2021-06-25 DIAGNOSIS — I480 Paroxysmal atrial fibrillation: Secondary | ICD-10-CM

## 2021-06-25 DIAGNOSIS — F1010 Alcohol abuse, uncomplicated: Secondary | ICD-10-CM | POA: Diagnosis not present

## 2021-06-25 DIAGNOSIS — Z01810 Encounter for preprocedural cardiovascular examination: Secondary | ICD-10-CM | POA: Diagnosis not present

## 2021-06-25 DIAGNOSIS — G4733 Obstructive sleep apnea (adult) (pediatric): Secondary | ICD-10-CM | POA: Diagnosis not present

## 2021-06-25 NOTE — Patient Instructions (Signed)
Medication Instructions:  Your physician recommends that you continue on your current medications as directed. Please refer to the Current Medication list given to you today.  *If you need a refill on your cardiac medications before your next appointment, please call your pharmacy*   Lab Work: None If you have labs (blood work) drawn today and your tests are completely normal, you will receive your results only by: MyChart Message (if you have MyChart) OR A paper copy in the mail If you have any lab test that is abnormal or we need to change your treatment, we will call you to review the results.   Follow-Up: At [facility], you and your health needs are our priority.  As part of our continuing mission to provide you with exceptional heart care, we have created designated Provider Care Teams.  These Care Teams include your primary Cardiologist (physician) and Advanced Practice Providers (APPs -  Physician Assistants and Nurse Practitioners) who all work together to provide you with the care you need, when you need it.  Your next appointment:   6 month(s)  The format for your next appointment:   In Person  Provider:   Will Camnitz, MD{   

## 2021-06-30 ENCOUNTER — Telehealth: Payer: Self-pay | Admitting: Internal Medicine

## 2021-06-30 NOTE — Telephone Encounter (Signed)
Pt requesting toc from Dr. Ronnald Ramp to Dr. Sharlet Salina ? ?Please advise ?

## 2021-07-02 NOTE — Telephone Encounter (Signed)
Fine

## 2021-07-05 ENCOUNTER — Ambulatory Visit (INDEPENDENT_AMBULATORY_CARE_PROVIDER_SITE_OTHER): Payer: Medicare Other | Admitting: Internal Medicine

## 2021-07-05 ENCOUNTER — Encounter: Payer: Self-pay | Admitting: Internal Medicine

## 2021-07-05 VITALS — BP 136/69 | HR 74 | Temp 98.0°F | Ht 71.0 in | Wt 269.0 lb

## 2021-07-05 DIAGNOSIS — E538 Deficiency of other specified B group vitamins: Secondary | ICD-10-CM | POA: Diagnosis not present

## 2021-07-05 DIAGNOSIS — I10 Essential (primary) hypertension: Secondary | ICD-10-CM

## 2021-07-05 DIAGNOSIS — E118 Type 2 diabetes mellitus with unspecified complications: Secondary | ICD-10-CM

## 2021-07-05 DIAGNOSIS — D500 Iron deficiency anemia secondary to blood loss (chronic): Secondary | ICD-10-CM

## 2021-07-05 DIAGNOSIS — E876 Hypokalemia: Secondary | ICD-10-CM | POA: Diagnosis not present

## 2021-07-05 DIAGNOSIS — G320 Subacute combined degeneration of spinal cord in diseases classified elsewhere: Secondary | ICD-10-CM

## 2021-07-05 DIAGNOSIS — Z23 Encounter for immunization: Secondary | ICD-10-CM

## 2021-07-05 LAB — CBC WITH DIFFERENTIAL/PLATELET
Basophils Absolute: 0.1 10*3/uL (ref 0.0–0.1)
Basophils Relative: 1 % (ref 0.0–3.0)
Eosinophils Absolute: 0.1 10*3/uL (ref 0.0–0.7)
Eosinophils Relative: 1.9 % (ref 0.0–5.0)
HCT: 32.5 % — ABNORMAL LOW (ref 39.0–52.0)
Hemoglobin: 10.2 g/dL — ABNORMAL LOW (ref 13.0–17.0)
Lymphocytes Relative: 20.4 % (ref 12.0–46.0)
Lymphs Abs: 1.3 10*3/uL (ref 0.7–4.0)
MCHC: 31.5 g/dL (ref 30.0–36.0)
MCV: 79.9 fl (ref 78.0–100.0)
Monocytes Absolute: 0.6 10*3/uL (ref 0.1–1.0)
Monocytes Relative: 8.7 % (ref 3.0–12.0)
Neutro Abs: 4.4 10*3/uL (ref 1.4–7.7)
Neutrophils Relative %: 68 % (ref 43.0–77.0)
Platelets: 307 10*3/uL (ref 150.0–400.0)
RBC: 4.07 Mil/uL — ABNORMAL LOW (ref 4.22–5.81)
RDW: 19.1 % — ABNORMAL HIGH (ref 11.5–15.5)
WBC: 6.5 10*3/uL (ref 4.0–10.5)

## 2021-07-05 LAB — BASIC METABOLIC PANEL
BUN: 11 mg/dL (ref 6–23)
CO2: 27 mEq/L (ref 19–32)
Calcium: 8.7 mg/dL (ref 8.4–10.5)
Chloride: 101 mEq/L (ref 96–112)
Creatinine, Ser: 0.81 mg/dL (ref 0.40–1.50)
GFR: 87.28 mL/min (ref >60.00)
Glucose, Bld: 109 mg/dL — ABNORMAL HIGH (ref 70–99)
Potassium: 3.3 mEq/L — ABNORMAL LOW (ref 3.5–5.1)
Sodium: 138 mEq/L (ref 135–145)

## 2021-07-05 LAB — HEMOGLOBIN A1C: Hgb A1c MFr Bld: 5.9 % (ref 4.6–6.5)

## 2021-07-05 MED ORDER — SHINGRIX 50 MCG/0.5ML IM SUSR: 0.5000 mL | Freq: Once | INTRAMUSCULAR | 1 refills | Status: AC

## 2021-07-05 MED ORDER — CYANOCOBALAMIN 1000 MCG/ML IJ SOLN
1000.0000 ug | Freq: Once | INTRAMUSCULAR | Status: AC
  Administered 2021-07-05: 1000 ug via INTRAMUSCULAR

## 2021-07-05 NOTE — Progress Notes (Signed)
? ?Subjective:  ?Patient ID: Brian Myers, male    DOB: October 21, 1945  Age: 76 y.o. MRN: [id number] ? ?CC: Diabetes, Hypertension, and Anemia ? ? ?HPI ?Brian Myers presents for f/up - ? ?He complains of chronic, unchanged SOB. ? ?Outpatient Medications Prior to Visit  ?Medication Sig Dispense Refill  ? dabigatran (PRADAXA) 150 MG CAPS capsule TAKE 1 CAPSULE BY MOUTH EVERY 12 HOURS 180 capsule 3  ? diltiazem (CARDIZEM CD) 360 MG 24 hr capsule Take 1 capsule (360 mg total) by mouth daily. 90 capsule 3  ? doxycycline (MONODOX) 100 MG capsule Take 100 mg by mouth 2 (two) times daily. For one week    ? esomeprazole (NEXIUM) 20 MG capsule Take 20 mg by mouth 2 (two) times daily.  1  ? finasteride (PROSCAR) 5 MG tablet Take 5 mg by mouth daily.    ? fluticasone (FLONASE) 50 MCG/ACT nasal spray Place 1 spray into both nostrils 2 (two) times a day.    ? levalbuterol (XOPENEX HFA) 45 MCG/ACT inhaler Inhale 1 puff into the lungs every 6 (six) hours as needed for wheezing. 45 g 2  ? losartan (COZAAR) 50 MG tablet TAKE 1 TABLET BY MOUTH EVERY DAY 90 tablet 3  ? methylcellulose (ARTIFICIAL TEARS) 1 % ophthalmic solution Place 1 drop into both eyes 2 (two) times daily as needed (dry eyes).    ? montelukast (SINGULAIR) 10 MG tablet Take 1 tablet (10 mg total) by mouth at bedtime. 90 tablet 1  ? SYMBICORT 160-4.5 MCG/ACT inhaler SMARTSIG:2 Puff(s) By Mouth Twice Daily 3 each 1  ? ?No facility-administered medications prior to visit.  ? ? ?ROS ?Review of Systems  ?Constitutional:  Negative for chills, diaphoresis, fatigue and unexpected weight change.  ?Respiratory:  Positive for shortness of breath. Negative for cough, chest tightness and wheezing.   ?Cardiovascular:  Positive for leg swelling. Negative for chest pain and palpitations.  ?Gastrointestinal:  Negative for abdominal pain, blood in stool, constipation, diarrhea, nausea and vomiting.  ?Genitourinary: Negative.  Negative for difficulty urinating.  ?Musculoskeletal:  Negative.   ?Skin: Negative.   ?Neurological:  Negative for dizziness, weakness and light-headedness.  ?Hematological:  Negative for adenopathy. Does not bruise/bleed easily.  ?Psychiatric/Behavioral: Negative.    ? ?Objective:  ?BP 136/69 (BP Location: Left Arm, Patient Position: Sitting, Cuff Size: Large)   Pulse 74   Temp 98 ?F (36.7 ?C) (Oral)   Ht '5\' 11"'$  (1.803 m)   Wt 269 lb (122 kg)   SpO2 94%   BMI 37.52 kg/m?  ? ?BP Readings from Last 3 Encounters:  ?07/05/21 136/69  ?06/25/21 130/72  ?06/01/21 (!) 142/84  ? ? ?Wt Readings from Last 3 Encounters:  ?07/05/21 269 lb (122 kg)  ?06/25/21 272 lb (123.4 kg)  ?06/01/21 272 lb (123.4 kg)  ? ? ?Physical Exam ?Vitals reviewed.  ?Constitutional:   ?   Appearance: He is obese. He is ill-appearing. He is not diaphoretic.  ?HENT:  ?   Nose: Nose normal.  ?   Mouth/Throat:  ?   Mouth: Mucous membranes are moist.  ?Eyes:  ?   General: No scleral icterus. ?   Conjunctiva/sclera: Conjunctivae normal.  ?Cardiovascular:  ?   Rate and Rhythm: Normal rate. Rhythm irregularly irregular.  ?   Heart sounds: No murmur heard. ?Pulmonary:  ?   Effort: Pulmonary effort is normal.  ?   Breath sounds: No stridor. No wheezing, rhonchi or rales.  ?Abdominal:  ?   General: Abdomen is protuberant.  Bowel sounds are normal. There is no distension.  ?   Palpations: Abdomen is soft. There is no fluid wave, hepatomegaly, splenomegaly or mass.  ?   Tenderness: There is no abdominal tenderness. There is no guarding.  ?Musculoskeletal:     ?   General: Normal range of motion.  ?   Right lower leg: Edema (trace pitting) present.  ?   Left lower leg: Edema (trace pitting) present.  ?Skin: ?   General: Skin is warm and dry.  ?   Coloration: Skin is pale.  ?Neurological:  ?   General: No focal deficit present.  ?   Mental Status: He is alert.  ?Psychiatric:     ?   Mood and Affect: Mood normal.     ?   Behavior: Behavior normal.  ? ? ?Lab Results  ?Component Value Date  ? WBC 6.5 07/05/2021  ? HGB  10.2 (L) 07/05/2021  ? HCT 32.5 (L) 07/05/2021  ? PLT 307.0 07/05/2021  ? GLUCOSE 109 (H) 07/05/2021  ? CHOL 222 (H) 01/04/2021  ? TRIG 105.0 01/04/2021  ? HDL 48.50 01/04/2021  ? LDLCALC 152 (H) 01/04/2021  ? ALT 84 (H) 01/04/2021  ? AST 87 (H) 01/04/2021  ? NA 138 07/05/2021  ? K 3.3 (L) 07/05/2021  ? CL 101 07/05/2021  ? CREATININE 0.81 07/05/2021  ? BUN 11 07/05/2021  ? CO2 27 07/05/2021  ? TSH 3.47 01/04/2021  ? PSA 14.79 (H) 01/04/2021  ? INR 1.04 04/12/2013  ? HGBA1C 5.9 07/05/2021  ? MICROALBUR 1.1 01/04/2021  ? ? ?ECHO TEE ? ?Result Date: 06/10/2020 ?   TRANSESOPHOGEAL ECHO REPORT   Patient Name:   Brian Myers Date of Exam: 06/10/2020 Medical Rec #:  [id number]         Height:       71.0 in Accession #:    [id number]        Weight:       275.0 lb Date of Birth:  24-Nov-1945         BSA:          2.414 m? Patient Age:    21 years          BP:           169/100 mmHg Patient Gender: M                 HR:           111 bpm. Exam Location:  Outpatient Procedure: Transesophageal Echo and 3D Echo Indications:     atrial fibrillation  History:         Patient has prior history of Echocardiogram examinations, most                  recent 08/30/2016. Risk Factors:Sleep Apnea, Hypertension and                  Dyslipidemia.  Sonographer:     Johny Chess Referring Phys:  [id number] [place] Diagnosing Phys: Cherlynn Kaiser MD PROCEDURE: After discussion of the risks and benefits of a TEE, an informed consent was obtained from the patient. TEE procedure time was 12 minutes. The transesophogeal probe was passed without difficulty through the esophogus of the patient. Imaged were obtained with the patient in a left lateral decubitus position. Local oropharyngeal anesthetic was provided with Cetacaine. Sedation performed by different physician. The patient was monitored while under deep sedation. Anesthestetic sedation was provided intravenously by Anesthesiology: '400mg'$   of Propofol, '20mg'$  of Lidocaine. Image  quality was adequate. The patient's vital signs; including heart rate, blood pressure, and oxygen saturation; remained stable throughout the procedure. The patient developed no complications during the procedure. A successful direct current cardioversion was performed at 200 joules with 1 attempt. Limited images obtained on TEE due to inability to safely sedate patient without respiratory compromise. IMPRESSIONS  1. Left ventricular ejection fraction, by estimation, is 55 to 60%. The left ventricle has normal function.  2. Right ventricular systolic function is normal. The right ventricular size is normal.  3. Left atrial size was mildly dilated. No left atrial/left atrial appendage thrombus was detected.  4. Right atrial size was mildly dilated.  5. The mitral valve is normal in structure. Trivial mitral valve regurgitation. No evidence of mitral stenosis.  6. The aortic valve is normal in structure. Aortic valve regurgitation is not visualized. No aortic stenosis is present.  7. Aortic dilatation noted. Aneurysm of the aortic root, measuring 45 mm. There is dilatation of the aortic root.  8. Successful TEE cardioversion with 1 shock at 200J with sinus rhythm. FINDINGS  Left Ventricle: Left ventricular ejection fraction, by estimation, is 55 to 60%. The left ventricle has normal function. The left ventricular internal cavity size was normal in size. Right Ventricle: The right ventricular size is normal. No increase in right ventricular wall thickness. Right ventricular systolic function is normal. Left Atrium: Left atrial size was mildly dilated. No left atrial/left atrial appendage thrombus was detected. Right Atrium: Right atrial size was mildly dilated. Pericardium: There is no evidence of pericardial effusion. Mitral Valve: The mitral valve is normal in structure. Trivial mitral valve regurgitation. No evidence of mitral valve stenosis. Tricuspid Valve: The tricuspid valve is normal in structure. Tricuspid  valve regurgitation is mild . No evidence of tricuspid stenosis. Aortic Valve: The aortic valve is normal in structure. Aortic valve regurgitation is not visualized. No aortic stenosis is present. Pulmonic Valve: T

## 2021-07-05 NOTE — Patient Instructions (Signed)
Health Maintenance, Male Adopting a healthy lifestyle and getting preventive care are important in promoting health and wellness. Ask your health care provider about: The right schedule for you to have regular tests and exams. Things you can do on your own to prevent diseases and keep yourself healthy. What should I know about diet, weight, and exercise? Eat a healthy diet  Eat a diet that includes plenty of vegetables, fruits, low-fat dairy products, and lean protein. Do not eat a lot of foods that are high in solid fats, added sugars, or sodium. Maintain a healthy weight Body mass index (BMI) is a measurement that can be used to identify possible weight problems. It estimates body fat based on height and weight. Your health care provider can help determine your BMI and help you achieve or maintain a healthy weight. Get regular exercise Get regular exercise. This is one of the most important things you can do for your health. Most adults should: Exercise for at least 150 minutes each week. The exercise should increase your heart rate and make you sweat (moderate-intensity exercise). Do strengthening exercises at least twice a week. This is in addition to the moderate-intensity exercise. Spend less time sitting. Even light physical activity can be beneficial. Watch cholesterol and blood lipids Have your blood tested for lipids and cholesterol at 74 years of age, then have this test every 5 years. You may need to have your cholesterol levels checked more often if: Your lipid or cholesterol levels are high. You are older than 74 years of age. You are at high risk for heart disease. What should I know about cancer screening? Many types of cancers can be detected early and may often be prevented. Depending on your health history and family history, you may need to have cancer screening at various ages. This may include screening for: Colorectal cancer. Prostate cancer. Skin cancer. Lung  cancer. What should I know about heart disease, diabetes, and high blood pressure? Blood pressure and heart disease High blood pressure causes heart disease and increases the risk of stroke. This is more likely to develop in people who have high blood pressure readings or are overweight. Talk with your health care provider about your target blood pressure readings. Have your blood pressure checked: Every 3-5 years if you are 18-39 years of age. Every year if you are 40 years old or older. If you are between the ages of 65 and 75 and are a current or former smoker, ask your health care provider if you should have a one-time screening for abdominal aortic aneurysm (AAA). Diabetes Have regular diabetes screenings. This checks your fasting blood sugar level. Have the screening done: Once every three years after age 45 if you are at a normal weight and have a low risk for diabetes. More often and at a younger age if you are overweight or have a high risk for diabetes. What should I know about preventing infection? Hepatitis B If you have a higher risk for hepatitis B, you should be screened for this virus. Talk with your health care provider to find out if you are at risk for hepatitis B infection. Hepatitis C Blood testing is recommended for: Everyone born from 1945 through 1965. Anyone with known risk factors for hepatitis C. Sexually transmitted infections (STIs) You should be screened each year for STIs, including gonorrhea and chlamydia, if: You are sexually active and are younger than 74 years of age. You are older than 74 years of age and your   health care provider tells you that you are at risk for this type of infection. Your sexual activity has changed since you were last screened, and you are at increased risk for chlamydia or gonorrhea. Ask your health care provider if you are at risk. Ask your health care provider about whether you are at high risk for HIV. Your health care provider  may recommend a prescription medicine to help prevent HIV infection. If you choose to take medicine to prevent HIV, you should first get tested for HIV. You should then be tested every 3 months for as long as you are taking the medicine. Follow these instructions at home: Alcohol use Do not drink alcohol if your health care provider tells you not to drink. If you drink alcohol: Limit how much you have to 0-2 drinks a day. Know how much alcohol is in your drink. In the [place] one drink equals one 12 oz bottle of beer (355 mL), one 5 oz glass of wine (148 mL), or one 1 oz glass of hard liquor (44 mL). Lifestyle Do not use any products that contain nicotine or tobacco. These products include cigarettes, chewing tobacco, and vaping devices, such as e-cigarettes. If you need help quitting, ask your health care provider. Do not use street drugs. Do not share needles. Ask your health care provider for help if you need support or information about quitting drugs. General instructions Schedule regular health, dental, and eye exams. Stay current with your vaccines. Tell your health care provider if: You often feel depressed. You have ever been abused or do not feel safe at home. Summary Adopting a healthy lifestyle and getting preventive care are important in promoting health and wellness. Follow your health care provider's instructions about healthy diet, exercising, and getting tested or screened for diseases. Follow your health care provider's instructions on monitoring your cholesterol and blood pressure. This information is not intended to replace advice given to you by your health care provider. Make sure you discuss any questions you have with your health care provider. Document Revised: 07/06/2020 Document Reviewed: 07/06/2020 Elsevier Patient Education  2023 [employer] Inc[employer]  

## 2021-07-06 DIAGNOSIS — E876 Hypokalemia: Secondary | ICD-10-CM | POA: Insufficient documentation

## 2021-07-06 LAB — IBC + FERRITIN
Ferritin: 29 ng/mL (ref 22.0–322.0)
Iron: 11 ug/dL — ABNORMAL LOW (ref 42–165)
Saturation Ratios: 2.5 % — ABNORMAL LOW (ref 20.0–50.0)
TIBC: 439.6 ug/dL (ref 250.0–450.0)
Transferrin: 314 mg/dL (ref 212.0–360.0)

## 2021-07-06 MED ORDER — ACCRUFER 30 MG PO CAPS: 1.0000 | ORAL_CAPSULE | Freq: Two times a day (BID) | ORAL | 0 refills | Status: DC

## 2021-07-06 MED ORDER — POTASSIUM CHLORIDE CRYS ER 15 MEQ PO TBCR: 15.0000 meq | EXTENDED_RELEASE_TABLET | Freq: Two times a day (BID) | ORAL | 0 refills | Status: DC

## 2021-07-12 DIAGNOSIS — R8271 Bacteriuria: Secondary | ICD-10-CM | POA: Diagnosis not present

## 2021-07-15 ENCOUNTER — Other Ambulatory Visit: Payer: Self-pay | Admitting: Internal Medicine

## 2021-07-15 DIAGNOSIS — J4520 Mild intermittent asthma, uncomplicated: Secondary | ICD-10-CM

## 2021-07-23 ENCOUNTER — Other Ambulatory Visit: Payer: Self-pay | Admitting: Interventional Cardiology

## 2021-07-23 NOTE — Telephone Encounter (Signed)
Pradaxa '150mg'$  refill request received. Pt is 76 years old, weight-122kg, Crea-0.81 on 07/05/2021, last seen by Oda Kilts on 06/25/2021, Diagnosis-Afib, CrCl-140.34m/min; Dose is appropriate based on dosing criteria. Will send in refill to requested pharmacy.

## 2021-08-09 DIAGNOSIS — Z23 Encounter for immunization: Secondary | ICD-10-CM | POA: Diagnosis not present

## 2021-08-11 ENCOUNTER — Ambulatory Visit (INDEPENDENT_AMBULATORY_CARE_PROVIDER_SITE_OTHER): Payer: Medicare Other

## 2021-08-11 DIAGNOSIS — G320 Subacute combined degeneration of spinal cord in diseases classified elsewhere: Secondary | ICD-10-CM | POA: Diagnosis not present

## 2021-08-11 DIAGNOSIS — E538 Deficiency of other specified B group vitamins: Secondary | ICD-10-CM | POA: Diagnosis not present

## 2021-08-11 MED ORDER — CYANOCOBALAMIN 1000 MCG/ML IJ SOLN
1000.0000 ug | Freq: Once | INTRAMUSCULAR | Status: AC
  Administered 2021-08-11: 1000 ug via INTRAMUSCULAR

## 2021-08-11 NOTE — Progress Notes (Signed)
After obtaining consent, and per orders of Dr. Ronnald Ramp, injection of Vitamin B 12 1000 mcg given by RN Clinical Supervisor was administered into patients left arm per patient request. Patient appeared to tolerate injection well.

## 2021-09-22 ENCOUNTER — Ambulatory Visit (INDEPENDENT_AMBULATORY_CARE_PROVIDER_SITE_OTHER): Payer: Medicare Other

## 2021-09-22 DIAGNOSIS — E538 Deficiency of other specified B group vitamins: Secondary | ICD-10-CM

## 2021-09-22 MED ORDER — CYANOCOBALAMIN 1000 MCG/ML IJ SOLN
1000.0000 ug | Freq: Once | INTRAMUSCULAR | Status: AC
  Administered 2021-09-22: 1000 ug via INTRAMUSCULAR

## 2021-09-22 NOTE — Progress Notes (Signed)
After obtaining consent, and per orders of Dr. Jones, injection of B12 given by Khiara Shuping. Patient tolerated injection well in left deltoid and instructed to report any adverse reaction to me immediately.  

## 2021-10-12 ENCOUNTER — Encounter: Payer: Self-pay | Admitting: Internal Medicine

## 2021-10-12 ENCOUNTER — Ambulatory Visit (INDEPENDENT_AMBULATORY_CARE_PROVIDER_SITE_OTHER): Payer: Medicare Other | Admitting: Internal Medicine

## 2021-10-12 VITALS — BP 136/80 | HR 71 | Temp 98.3°F | Ht 71.0 in | Wt 266.0 lb

## 2021-10-12 DIAGNOSIS — E559 Vitamin D deficiency, unspecified: Secondary | ICD-10-CM

## 2021-10-12 DIAGNOSIS — G320 Subacute combined degeneration of spinal cord in diseases classified elsewhere: Secondary | ICD-10-CM

## 2021-10-12 DIAGNOSIS — I4819 Other persistent atrial fibrillation: Secondary | ICD-10-CM

## 2021-10-12 DIAGNOSIS — E118 Type 2 diabetes mellitus with unspecified complications: Secondary | ICD-10-CM | POA: Diagnosis not present

## 2021-10-12 DIAGNOSIS — R141 Gas pain: Secondary | ICD-10-CM | POA: Insufficient documentation

## 2021-10-12 DIAGNOSIS — E538 Deficiency of other specified B group vitamins: Secondary | ICD-10-CM | POA: Diagnosis not present

## 2021-10-12 DIAGNOSIS — Z1389 Encounter for screening for other disorder: Secondary | ICD-10-CM | POA: Diagnosis not present

## 2021-10-12 DIAGNOSIS — D500 Iron deficiency anemia secondary to blood loss (chronic): Secondary | ICD-10-CM | POA: Diagnosis not present

## 2021-10-12 DIAGNOSIS — Z86010 Personal history of colonic polyps: Secondary | ICD-10-CM | POA: Diagnosis not present

## 2021-10-12 DIAGNOSIS — R109 Unspecified abdominal pain: Secondary | ICD-10-CM | POA: Insufficient documentation

## 2021-10-12 DIAGNOSIS — D509 Iron deficiency anemia, unspecified: Secondary | ICD-10-CM | POA: Diagnosis not present

## 2021-10-12 DIAGNOSIS — R110 Nausea: Secondary | ICD-10-CM | POA: Insufficient documentation

## 2021-10-12 LAB — CBC
HCT: 34.3 % — ABNORMAL LOW (ref 39.0–52.0)
Hemoglobin: 10.6 g/dL — ABNORMAL LOW (ref 13.0–17.0)
MCHC: 31 g/dL (ref 30.0–36.0)
MCV: 77.1 fl — ABNORMAL LOW (ref 78.0–100.0)
Platelets: 233 10*3/uL (ref 150.0–400.0)
RBC: 4.45 Mil/uL (ref 4.22–5.81)
RDW: 21.5 % — ABNORMAL HIGH (ref 11.5–15.5)
WBC: 6 10*3/uL (ref 4.0–10.5)

## 2021-10-12 LAB — IRON: Iron: 29 ug/dL — ABNORMAL LOW (ref 42–165)

## 2021-10-12 LAB — VITAMIN B12: Vitamin B-12: 210 pg/mL — ABNORMAL LOW (ref 211–911)

## 2021-10-12 LAB — FERRITIN: Ferritin: 12.8 ng/mL — ABNORMAL LOW (ref 22.0–322.0)

## 2021-10-12 LAB — VITAMIN D 25 HYDROXY (VIT D DEFICIENCY, FRACTURES): VITD: <7 ng/mL — ABNORMAL LOW (ref 30.00–100.00)

## 2021-10-12 NOTE — Progress Notes (Signed)
   Subjective:   Patient ID: Brian Myers, male    DOB: 1946/02/21, 76 y.o.   MRN: [id number]  HPI The patent is a 76 YO man coming in for [facility] and follow up.   Review of Systems  Constitutional:  Positive for activity change and fatigue.  HENT: Negative.    Eyes: Negative.   Respiratory:  Negative for cough, chest tightness and shortness of breath.   Cardiovascular:  Negative for chest pain, palpitations and leg swelling.  Gastrointestinal:  Negative for abdominal distention, abdominal pain, constipation, diarrhea, nausea and vomiting.  Musculoskeletal: Negative.   Skin: Negative.   Neurological: Negative.   Psychiatric/Behavioral: Negative.      Objective:  Physical Exam Constitutional:      Appearance: He is well-developed.  HENT:     Head: Normocephalic and atraumatic.  Cardiovascular:     Rate and Rhythm: Normal rate and regular rhythm.  Pulmonary:     Effort: Pulmonary effort is normal. No respiratory distress.     Breath sounds: Normal breath sounds. No wheezing or rales.  Abdominal:     General: Bowel sounds are normal. There is no distension.     Palpations: Abdomen is soft.     Tenderness: There is no abdominal tenderness. There is no rebound.  Musculoskeletal:     Cervical back: Normal range of motion.  Skin:    General: Skin is warm and dry.  Neurological:     Mental Status: He is alert and oriented to person, place, and time.     Coordination: Coordination normal.     Vitals:   10/12/21 1030  BP: 136/80  Pulse: 71  Temp: 98.3 F (36.8 C)  TempSrc: Oral  SpO2: 99%  Weight: 266 lb (120.7 kg)  Height: '5\' 11"'$  (1.803 m)    Assessment & Plan:

## 2021-10-12 NOTE — Patient Instructions (Addendum)
We will get the records from [place].    We will get you and your wife in with a dermatologist.

## 2021-10-15 DIAGNOSIS — H40013 Open angle with borderline findings, low risk, bilateral: Secondary | ICD-10-CM | POA: Diagnosis not present

## 2021-10-15 DIAGNOSIS — Z83511 Family history of glaucoma: Secondary | ICD-10-CM | POA: Diagnosis not present

## 2021-10-15 DIAGNOSIS — H25813 Combined forms of age-related cataract, bilateral: Secondary | ICD-10-CM | POA: Diagnosis not present

## 2021-10-15 NOTE — Assessment & Plan Note (Signed)
Most recent Hga1c 5.9 with diet changes. Will monitor again in 3-6 months.

## 2021-10-15 NOTE — Assessment & Plan Note (Signed)
Getting records from [place].

## 2021-10-15 NOTE — Assessment & Plan Note (Addendum)
Checking CBC and ferritin and iron levels today. Adjust as needed.

## 2021-10-15 NOTE — Assessment & Plan Note (Signed)
Unclear if this could be causing some of his fatigue. He is taking pradaxa and diltiazem.

## 2021-10-15 NOTE — Assessment & Plan Note (Signed)
Needs recheck B12 levels today to assess response. Ordered.

## 2021-10-18 ENCOUNTER — Other Ambulatory Visit: Payer: Self-pay | Admitting: Internal Medicine

## 2021-10-18 MED ORDER — VITAMIN D (ERGOCALCIFEROL) 1.25 MG (50000 UNIT) PO CAPS: 50000.0000 [IU] | ORAL_CAPSULE | ORAL | 0 refills | Status: DC

## 2021-10-25 ENCOUNTER — Ambulatory Visit (INDEPENDENT_AMBULATORY_CARE_PROVIDER_SITE_OTHER): Payer: Medicare Other | Admitting: *Deleted

## 2021-10-25 DIAGNOSIS — E538 Deficiency of other specified B group vitamins: Secondary | ICD-10-CM | POA: Diagnosis not present

## 2021-10-25 MED ORDER — CYANOCOBALAMIN 1000 MCG/ML IJ SOLN
1000.0000 ug | Freq: Once | INTRAMUSCULAR | Status: AC
  Administered 2021-10-25: 1000 ug via INTRAMUSCULAR

## 2021-10-25 NOTE — Progress Notes (Signed)
Patient here for his b12 injections. Given in left deltoid. Patient tolerated well .

## 2021-11-08 ENCOUNTER — Ambulatory Visit (INDEPENDENT_AMBULATORY_CARE_PROVIDER_SITE_OTHER): Payer: Medicare Other

## 2021-11-08 DIAGNOSIS — E538 Deficiency of other specified B group vitamins: Secondary | ICD-10-CM

## 2021-11-08 MED ORDER — CYANOCOBALAMIN 1000 MCG/ML IJ SOLN
1000.0000 ug | Freq: Once | INTRAMUSCULAR | Status: AC
  Administered 2021-11-08: 1000 ug via INTRAMUSCULAR

## 2021-11-08 NOTE — Progress Notes (Signed)
After obtaining consent, and per orders of Dr. Sharlet Salina, injection of B12 given left deltoid by Marrian Salvage. Patient instructed to report any adverse reaction to me immediately.

## 2021-11-23 ENCOUNTER — Ambulatory Visit: Payer: Medicare Other

## 2021-11-30 ENCOUNTER — Ambulatory Visit (INDEPENDENT_AMBULATORY_CARE_PROVIDER_SITE_OTHER): Payer: Medicare Other

## 2021-11-30 DIAGNOSIS — E538 Deficiency of other specified B group vitamins: Secondary | ICD-10-CM

## 2021-11-30 MED ORDER — CYANOCOBALAMIN 1000 MCG/ML IJ SOLN
1000.0000 ug | Freq: Once | INTRAMUSCULAR | Status: AC
  Administered 2021-11-30: 1000 ug via INTRAMUSCULAR

## 2021-11-30 NOTE — Progress Notes (Signed)
Pt here for bi-weekly B12 injection per Dr. Sharlet Salina  B12 1019mg given IM, and pt tolerated injection well.  Next B12 injection scheduled for 12/14/21

## 2021-12-01 DIAGNOSIS — Z23 Encounter for immunization: Secondary | ICD-10-CM | POA: Diagnosis not present

## 2021-12-07 ENCOUNTER — Ambulatory Visit: Payer: Medicare Other

## 2021-12-10 ENCOUNTER — Encounter: Payer: Medicare Other | Admitting: Internal Medicine

## 2021-12-14 ENCOUNTER — Ambulatory Visit (INDEPENDENT_AMBULATORY_CARE_PROVIDER_SITE_OTHER): Payer: Medicare Other | Admitting: *Deleted

## 2021-12-14 DIAGNOSIS — E538 Deficiency of other specified B group vitamins: Secondary | ICD-10-CM

## 2021-12-14 MED ORDER — CYANOCOBALAMIN 1000 MCG/ML IJ SOLN
1000.0000 ug | Freq: Once | INTRAMUSCULAR | Status: AC
  Administered 2021-12-14: 1000 ug via INTRAMUSCULAR

## 2021-12-14 NOTE — Progress Notes (Signed)
Administered bi-weekly  B12 1000 mcg/mL right deltoid. Pt tolerated well.

## 2022-01-06 ENCOUNTER — Encounter: Payer: Self-pay | Admitting: Internal Medicine

## 2022-01-06 ENCOUNTER — Ambulatory Visit (INDEPENDENT_AMBULATORY_CARE_PROVIDER_SITE_OTHER): Payer: Medicare Other | Admitting: Internal Medicine

## 2022-01-06 VITALS — BP 148/86 | HR 79 | Temp 98.3°F | Ht 71.0 in | Wt 276.0 lb

## 2022-01-06 DIAGNOSIS — G320 Subacute combined degeneration of spinal cord in diseases classified elsewhere: Secondary | ICD-10-CM | POA: Diagnosis not present

## 2022-01-06 DIAGNOSIS — E559 Vitamin D deficiency, unspecified: Secondary | ICD-10-CM

## 2022-01-06 DIAGNOSIS — E118 Type 2 diabetes mellitus with unspecified complications: Secondary | ICD-10-CM | POA: Diagnosis not present

## 2022-01-06 DIAGNOSIS — E538 Deficiency of other specified B group vitamins: Secondary | ICD-10-CM

## 2022-01-06 DIAGNOSIS — D500 Iron deficiency anemia secondary to blood loss (chronic): Secondary | ICD-10-CM

## 2022-01-06 DIAGNOSIS — E785 Hyperlipidemia, unspecified: Secondary | ICD-10-CM

## 2022-01-06 DIAGNOSIS — I10 Essential (primary) hypertension: Secondary | ICD-10-CM | POA: Diagnosis not present

## 2022-01-06 LAB — MICROALBUMIN / CREATININE URINE RATIO
Creatinine,U: 74.9 mg/dL
Microalb Creat Ratio: 0.9 mg/g (ref 0.0–30.0)
Microalb, Ur: 0.7 mg/dL (ref 0.0–1.9)

## 2022-01-06 LAB — LIPID PANEL
Cholesterol: 217 mg/dL — ABNORMAL HIGH (ref 0–200)
HDL: 50.5 mg/dL (ref >39.00)
LDL Cholesterol: 150 mg/dL — ABNORMAL HIGH (ref 0–99)
NonHDL: 166.92
Total CHOL/HDL Ratio: 4
Triglycerides: 85 mg/dL (ref 0.0–149.0)
VLDL: 17 mg/dL (ref 0.0–40.0)

## 2022-01-06 LAB — COMPREHENSIVE METABOLIC PANEL
ALT: 43 U/L (ref 0–53)
AST: 47 U/L — ABNORMAL HIGH (ref 0–37)
Albumin: 4.1 g/dL (ref 3.5–5.2)
Alkaline Phosphatase: 52 U/L (ref 39–117)
BUN: 11 mg/dL (ref 6–23)
CO2: 30 mEq/L (ref 19–32)
Calcium: 8.9 mg/dL (ref 8.4–10.5)
Chloride: 100 mEq/L (ref 96–112)
Creatinine, Ser: 0.81 mg/dL (ref 0.40–1.50)
GFR: 86.97 mL/min (ref >60.00)
Glucose, Bld: 98 mg/dL (ref 70–99)
Potassium: 3.7 mEq/L (ref 3.5–5.1)
Sodium: 137 mEq/L (ref 135–145)
Total Bilirubin: 0.6 mg/dL (ref 0.2–1.2)
Total Protein: 7.8 g/dL (ref 6.0–8.3)

## 2022-01-06 LAB — CBC
HCT: 34.5 % — ABNORMAL LOW (ref 39.0–52.0)
Hemoglobin: 10.6 g/dL — ABNORMAL LOW (ref 13.0–17.0)
MCHC: 30.7 g/dL (ref 30.0–36.0)
MCV: 76.9 fl — ABNORMAL LOW (ref 78.0–100.0)
Platelets: 235 10*3/uL (ref 150.0–400.0)
RBC: 4.49 Mil/uL (ref 4.22–5.81)
RDW: 18.7 % — ABNORMAL HIGH (ref 11.5–15.5)
WBC: 7.3 10*3/uL (ref 4.0–10.5)

## 2022-01-06 LAB — VITAMIN D 25 HYDROXY (VIT D DEFICIENCY, FRACTURES): VITD: 48.41 ng/mL (ref 30.00–100.00)

## 2022-01-06 LAB — VITAMIN B12: Vitamin B-12: 312 pg/mL (ref 211–911)

## 2022-01-06 LAB — HEMOGLOBIN A1C: Hgb A1c MFr Bld: 7.1 % — ABNORMAL HIGH (ref 4.6–6.5)

## 2022-01-06 NOTE — Patient Instructions (Addendum)
We will check the labs today. 

## 2022-01-06 NOTE — Progress Notes (Signed)
   Subjective:   Patient ID: Brian Myers, male    DOB: 11-Jan-1946, 76 y.o.   MRN: [id number]  HPI The patient is a 76 YO man coming in for follow up.  Review of Systems  Constitutional:  Positive for activity change and fatigue.  HENT: Negative.    Eyes: Negative.   Respiratory:  Negative for cough, chest tightness and shortness of breath.   Cardiovascular:  Negative for chest pain, palpitations and leg swelling.  Gastrointestinal:  Negative for abdominal distention, abdominal pain, constipation, diarrhea, nausea and vomiting.  Musculoskeletal:  Positive for arthralgias.  Skin: Negative.   Neurological: Negative.   Psychiatric/Behavioral: Negative.      Objective:  Physical Exam Constitutional:      Appearance: He is well-developed. He is obese.  HENT:     Head: Normocephalic and atraumatic.  Cardiovascular:     Rate and Rhythm: Normal rate and regular rhythm.  Pulmonary:     Effort: Pulmonary effort is normal. No respiratory distress.     Breath sounds: Normal breath sounds. No wheezing or rales.  Abdominal:     General: Bowel sounds are normal. There is no distension.     Palpations: Abdomen is soft.     Tenderness: There is no abdominal tenderness. There is no rebound.  Musculoskeletal:        General: Tenderness present.     Cervical back: Normal range of motion.  Skin:    General: Skin is warm and dry.  Neurological:     Mental Status: He is alert and oriented to person, place, and time.     Coordination: Coordination normal.     Vitals:   01/06/22 1055 01/06/22 1059 01/06/22 1117  BP: (!) 160/100 (!) 160/100 (!) 148/86  Pulse: 79    Temp: 98.3 F (36.8 C)    TempSrc: Oral    SpO2: 95%    Weight: 276 lb (125.2 kg)    Height: '5\' 11"'$  (1.803 m)      Assessment & Plan:

## 2022-01-07 DIAGNOSIS — E559 Vitamin D deficiency, unspecified: Secondary | ICD-10-CM | POA: Insufficient documentation

## 2022-01-07 NOTE — Assessment & Plan Note (Signed)
Is going to start taking multivitamin. We did replace vitamin D and B12 as well. Checking CBC.

## 2022-01-07 NOTE — Assessment & Plan Note (Signed)
Checking B12 level and if back in normal range will switch to oral 1000 mcg daily B12 otc.

## 2022-01-07 NOTE — Assessment & Plan Note (Signed)
Foot exam done, checking microalbumin to creatinine ratio. Checking Hga1c. He had recent pre-diabetes range and if persistent would be able to change to pre-diabetes. On ARB. Checking lipid panel.

## 2022-01-07 NOTE — Assessment & Plan Note (Signed)
Checking vitamin d and is just finishing 50000 units vitamin D weekly for 3 months.

## 2022-01-17 DIAGNOSIS — N401 Enlarged prostate with lower urinary tract symptoms: Secondary | ICD-10-CM | POA: Diagnosis not present

## 2022-01-17 DIAGNOSIS — R3914 Feeling of incomplete bladder emptying: Secondary | ICD-10-CM | POA: Diagnosis not present

## 2022-01-17 DIAGNOSIS — N323 Diverticulum of bladder: Secondary | ICD-10-CM | POA: Diagnosis not present

## 2022-01-17 DIAGNOSIS — R972 Elevated prostate specific antigen [PSA]: Secondary | ICD-10-CM | POA: Diagnosis not present

## 2022-01-24 ENCOUNTER — Telehealth: Payer: Self-pay | Admitting: Internal Medicine

## 2022-01-24 NOTE — Telephone Encounter (Signed)
Patient will call back to schedule AWV-I with [facility].

## 2022-01-28 ENCOUNTER — Encounter: Payer: Self-pay | Admitting: Internal Medicine

## 2022-01-28 ENCOUNTER — Other Ambulatory Visit: Payer: Self-pay | Admitting: Internal Medicine

## 2022-01-28 DIAGNOSIS — J4520 Mild intermittent asthma, uncomplicated: Secondary | ICD-10-CM

## 2022-01-28 MED ORDER — SYMBICORT 160-4.5 MCG/ACT IN AERO: INHALATION_SPRAY | RESPIRATORY_TRACT | 1 refills | Status: DC

## 2022-03-24 ENCOUNTER — Other Ambulatory Visit: Payer: Self-pay | Admitting: Internal Medicine

## 2022-03-24 MED ORDER — PROMETHAZINE-DM 6.25-15 MG/5ML PO SYRP: 5.0000 mL | ORAL_SOLUTION | Freq: Four times a day (QID) | ORAL | 0 refills | Status: DC | PRN

## 2022-03-31 ENCOUNTER — Other Ambulatory Visit: Payer: Self-pay | Admitting: Internal Medicine

## 2022-03-31 ENCOUNTER — Other Ambulatory Visit: Payer: Self-pay

## 2022-03-31 ENCOUNTER — Encounter: Payer: Self-pay | Admitting: Internal Medicine

## 2022-03-31 DIAGNOSIS — J4520 Mild intermittent asthma, uncomplicated: Secondary | ICD-10-CM

## 2022-03-31 MED ORDER — LEVALBUTEROL TARTRATE 45 MCG/ACT IN AERO: 1.0000 | INHALATION_SPRAY | Freq: Four times a day (QID) | RESPIRATORY_TRACT | 2 refills | Status: DC | PRN

## 2022-05-18 MED ORDER — DABIGATRAN ETEXILATE MESYLATE 150 MG PO CAPS: 150.0000 mg | ORAL_CAPSULE | Freq: Two times a day (BID) | ORAL | 0 refills | Status: DC

## 2022-05-18 NOTE — Telephone Encounter (Signed)
Pt last saw Oda Kilts, [place] on 06/25/21, pt is due for follow-up.  Pt was supposed to follow-up with Dr Curt Bears in 6 months.  Last labs 01/06/22 Creat 0.81, age 77, weight 125.2kg, CrCl 141.69, based on CrCl pt is on appropriate dosage of Pradaxa 150mg  BID.  Will send a note to the schedulers pt is due for follow-up and also to pt in my chart.  I have refilled Pradaxa rx x 1 90 day supply, with message to pt will need OV for future refills.

## 2022-05-25 ENCOUNTER — Other Ambulatory Visit: Payer: Self-pay | Admitting: Cardiology

## 2022-05-25 ENCOUNTER — Other Ambulatory Visit: Payer: Self-pay | Admitting: Internal Medicine

## 2022-05-25 DIAGNOSIS — J4520 Mild intermittent asthma, uncomplicated: Secondary | ICD-10-CM

## 2022-05-25 NOTE — Telephone Encounter (Signed)
Refill not appropriate. Pt currently on Pradaxa.

## 2022-06-02 ENCOUNTER — Ambulatory Visit: Payer: Medicare Other | Admitting: Adult Health

## 2022-06-03 DIAGNOSIS — Z83511 Family history of glaucoma: Secondary | ICD-10-CM | POA: Diagnosis not present

## 2022-06-03 DIAGNOSIS — R7303 Prediabetes: Secondary | ICD-10-CM | POA: Diagnosis not present

## 2022-06-03 DIAGNOSIS — H40013 Open angle with borderline findings, low risk, bilateral: Secondary | ICD-10-CM | POA: Diagnosis not present

## 2022-06-03 DIAGNOSIS — H25813 Combined forms of age-related cataract, bilateral: Secondary | ICD-10-CM | POA: Diagnosis not present

## 2022-06-05 NOTE — Progress Notes (Unsigned)
Cardiology Office Note Date:  06/05/2022  Patient ID:  Brian Myers, Brian Myers 1945-08-11, MRN [id number] PCP:  Myrlene Broker, MD  Cardiologist:  Dr. Katrinka Blazing Electrophysiologist: Dr. Johney Frame [phone])    Chief Complaint: *** 6 mo  History of Present Illness: Brian Myers is a 77 y.o. male with history of OSA w/CPAP, HLD, DM, morbid obesity  He saw Dr. Katrinka Blazing Sept 2021, c/w palpitations though unable to catch rhythm strips via his wearable tech.  Described as brief, no prolonged episodes, remained on flecainide.  Dr. Katrinka Blazing mentioned PAC's/PVCs, if started to have prolonged episodes perhaps consider a monitor. Mentioned dilt was historically reduced 2/2 BP  I saw him 06/03/20 He is accompanied by his wife. Reports that he was doing some light cleaning in the garage and looked at his watch to see what time it was and noted his HR was 120's He felt well, had no perception of palpitations, fast HR No CP, SOB or unusual DOE. No dizzy spells, near syncope or syncope Hs usual resting HR is in the 50's Reports compliance with his CPAP No bleeding or signs of bleeding He in review of his watch data HRs 110's-120's since Friday afternoon He was in fast AF Planned for TEE/DCCV with missed pradaxa and f/u with Dr Johney Frame, nodal blocker not increased with baseline SB   Last saw EP/Dr. Johney Frame 02/05/21: diltiazem dose increased, discussed ablation and Tikosyn, pt preferred to continue rate control strategy  He saw H. Lisabeth Devoid, PA-C 05/12/21 via tele visit for pre-op pending colonoscopy  *** new ep  *** symptoms *** rate *** brady? *** bleeding, pradaxa, labs, dose  Afib Hx Diagnosed 20+ years ago  AAD Hx Flecainide started 2012, is current > stopped May 22, with recurrent AF Rate control strategy at pt preference   Past Medical History:  Diagnosis Date   Arthritis    KNEES   Asthma    Bladder problem    enlarged bladder   BPH (benign prostatic hypertrophy)    DIFFICULTY GETTING  STREAM STARTED   Eczema    HANDS   GERD (gastroesophageal reflux disease)    H/O hiatal hernia    Hyperlipidemia    Hyperlipidemia    DOES NOT TOLERATE ANY OF THE STATINS   Persistent atrial fibrillation (HCC)    Dr Katrinka Blazing   Pneumonia    LAST EPISODE JAN 2015   Right knee meniscal tear    PAIN RT KNEE   Sleep apnea    with cpap    Past Surgical History:  Procedure Laterality Date   CARDIOVERSION N/A 06/10/2020   Procedure: CARDIOVERSION;  Surgeon: Parke Poisson, MD;  Location: [facility] ENDOSCOPY;  Service: Cardiovascular;  Laterality: N/A;   CERVICAL FUSION  1991   KNEE ARTHROSCOPY Right 04/17/2013   Procedure: ARTHROSCOPY RIGHT KNEE WITH MEDIAL AND LATERAL DEBRIDEMENT AND CONDRAPLASTY;  Surgeon: Loanne Drilling, MD;  Location: [facility] ORS;  Service: Orthopedics;  Laterality: Right;   LEFT KNEE ARTHROSCPY  2009   TEE WITHOUT CARDIOVERSION N/A 06/10/2020   Procedure: TRANSESOPHAGEAL ECHOCARDIOGRAM (TEE);  Surgeon: Parke Poisson, MD;  Location: [facility] ENDOSCOPY;  Service: Cardiovascular;  Laterality: N/A;    Current Outpatient Medications  Medication Sig Dispense Refill   promethazine-dextromethorphan (PROMETHAZINE-DM) 6.25-15 MG/5ML syrup Take 5 mLs by mouth 4 (four) times daily as needed. 118 mL 0   albuterol (VENTOLIN HFA) 108 (90 Base) MCG/ACT inhaler Inhale 1 puff into the lungs every 4 (four) hours as needed for wheezing or shortness  of breath. 6.7 g 2   dabigatran (PRADAXA) 150 MG CAPS capsule Take 1 capsule (150 mg total) by mouth every 12 (twelve) hours. Overdue for follow-up, MUST see MD for FUTURE refills. 180 capsule 0   diltiazem (CARDIZEM CD) 360 MG 24 hr capsule TAKE 1 CAPSULE BY MOUTH EVERY DAY 90 capsule 1   doxycycline (MONODOX) 100 MG capsule Take 100 mg by mouth 2 (two) times daily. For one week     esomeprazole (NEXIUM) 20 MG capsule Take 20 mg by mouth 2 (two) times daily.  1   finasteride (PROSCAR) 5 MG tablet Take 5 mg by mouth daily.     fluticasone (FLONASE) 50  MCG/ACT nasal spray Place 1 spray into both nostrils 2 (two) times a day.     losartan (COZAAR) 50 MG tablet TAKE 1 TABLET BY MOUTH EVERY DAY 90 tablet 1   methylcellulose (ARTIFICIAL TEARS) 1 % ophthalmic solution Place 1 drop into both eyes 2 (two) times daily as needed (dry eyes).     SYMBICORT 160-4.5 MCG/ACT inhaler INHALE 2 PUFFS TWICE DAILY 30.6 each 1   Vitamin D, Ergocalciferol, (DRISDOL) 1.25 MG (50000 UNIT) CAPS capsule Take 1 capsule (50,000 Units total) by mouth every 7 (seven) days. 12 capsule 0   No current facility-administered medications for this visit.    Allergies:   Penicillins, Sulfa drugs cross reactors, Statins, and Omeprazole   Social History:  The patient  reports that he has never smoked. He has never used smokeless tobacco. He reports current alcohol use of about 14.0 - 21.0 standard drinks of alcohol per week. He reports that he does not use drugs.   Family History:  The patient's family history includes Arrhythmia in his mother; Hypertension in his father; Mitral valve prolapse in his mother.  ROS:  Please see the history of present illness.    All other systems are reviewed and otherwise negative.   PHYSICAL EXAM:  VS:  There were no vitals taken for this visit. BMI: There is no height or weight on file to calculate BMI. Well nourished, well developed, in no acute distress HEENT: normocephalic, atraumatic Neck: no JVD, carotid bruits or masses Cardiac:  ***; no significant murmurs, no rubs, or gallops Lungs:  ***CTA b/l, no wheezing, rhonchi or rales Abd: soft, nontender, obese MS: no deformity or atrophy Ext: *** trace-1+ edema (he reports his baseline) Skin: warm and dry, no rash Neuro:  No gross deficits appreciated Psych: euthymic mood, full affect   EKG:  Done today and reviewed by myself shows  ***   02/01/21: TTE 1. Left ventricular ejection fraction, by estimation, is 60 to 65%. The  left ventricle has normal function. The left ventricle  has no regional  wall motion abnormalities. There is moderate asymmetric left ventricular  hypertrophy of the basal and septal  segments. Left ventricular diastolic parameters were normal.   2. Right ventricular systolic function is normal. The right ventricular  size is normal.   3. Left atrial size was moderately dilated.   4. No sub costal windows.   5. The mitral valve is abnormal. Trivial mitral valve regurgitation. No  evidence of mitral stenosis.   6. The aortic valve is tricuspid. There is mild calcification of the  aortic valve. There is mild thickening of the aortic valve. Aortic valve  regurgitation is not visualized. Aortic valve sclerosis is present, with  no evidence of aortic valve stenosis.   7. Aortic dilatation noted. There is moderate dilatation of the  aortic  root, measuring 42 mm.   8. The inferior vena cava is normal in size with greater than 50%  respiratory variability, suggesting right atrial pressure of 3 mmHg.    08/30/2016: TTE Study Conclusions  - Left ventricle: The cavity size was normal. Wall thickness was    normal. Systolic function was normal. The estimated ejection    fraction was in the range of 60% to 65%. Wall motion was normal;    there were no regional wall motion abnormalities. Features are    consistent with a pseudonormal left ventricular filling pattern,    with concomitant abnormal relaxation and increased filling    pressure (grade 2 diastolic dysfunction).  - Right atrium: The atrium was mildly dilated.    Recent Labs: 01/06/2022: ALT 43; BUN 11; Creatinine, Ser 0.81; Hemoglobin 10.6; Platelets 235.0; Potassium 3.7; Sodium 137  01/06/2022: Cholesterol 217; HDL 50.50; LDL Cholesterol 150; Total CHOL/HDL Ratio 4; Triglycerides 85.0; VLDL 17.0   CrCl cannot be calculated (Patient's most recent lab result is older than the maximum 21 days allowed.).   Wt Readings from Last 3 Encounters:  01/06/22 276 lb (125.2 kg)  10/12/21 266 lb (120.7  kg)  07/05/21 269 lb (122 kg)     Other studies reviewed: Additional studies/records reviewed today include: summarized above  ASSESSMENT AND PLAN:  1. Paroxysmal AFib     CHA2DS2Vasc is 3, on pradaxa, *** appropriately dosed     ***     2. HTN     *** No change today, follow  3. HLD     *** Not addressed today    Disposition: F/u as above   Current medicines are reviewed at length with the patient today.  The patient did not have any concerns regarding medicines.  Norma Fredrickson, PA-C 06/05/2022 8:55 AM     [facility] [address] [place] [address] [phone] (office)  [phone] (fax)

## 2022-06-06 ENCOUNTER — Ambulatory Visit (INDEPENDENT_AMBULATORY_CARE_PROVIDER_SITE_OTHER): Payer: Medicare Other

## 2022-06-06 ENCOUNTER — Ambulatory Visit: Payer: Medicare Other | Attending: Physician Assistant | Admitting: Physician Assistant

## 2022-06-06 ENCOUNTER — Encounter: Payer: Self-pay | Admitting: Physician Assistant

## 2022-06-06 VITALS — BP 116/74 | HR 80 | Ht 71.0 in | Wt 274.4 lb

## 2022-06-06 DIAGNOSIS — D6869 Other thrombophilia: Secondary | ICD-10-CM | POA: Diagnosis not present

## 2022-06-06 DIAGNOSIS — R0602 Shortness of breath: Secondary | ICD-10-CM | POA: Diagnosis not present

## 2022-06-06 DIAGNOSIS — I480 Paroxysmal atrial fibrillation: Secondary | ICD-10-CM | POA: Insufficient documentation

## 2022-06-06 DIAGNOSIS — I4811 Longstanding persistent atrial fibrillation: Secondary | ICD-10-CM | POA: Diagnosis not present

## 2022-06-06 DIAGNOSIS — I10 Essential (primary) hypertension: Secondary | ICD-10-CM | POA: Diagnosis not present

## 2022-06-06 MED ORDER — DABIGATRAN ETEXILATE MESYLATE 150 MG PO CAPS: 150.0000 mg | ORAL_CAPSULE | Freq: Two times a day (BID) | ORAL | 1 refills | Status: DC

## 2022-06-06 MED ORDER — DILTIAZEM HCL ER COATED BEADS 360 MG PO CP24: 360.0000 mg | ORAL_CAPSULE | Freq: Every day | ORAL | 1 refills | Status: DC

## 2022-06-06 MED ORDER — LOSARTAN POTASSIUM 50 MG PO TABS: 50.0000 mg | ORAL_TABLET | Freq: Every day | ORAL | 1 refills | Status: DC

## 2022-06-06 NOTE — Patient Instructions (Addendum)
Medication Instructions:   MEDICATIONS TO INQUIRE INSURANCE COMPANIES : ELIQUIS AND XARELTO   *If you need a refill on your cardiac medications before your next appointment, please call your pharmacy*   Lab Work:   BMET AND CBC TODAY   If you have labs (blood work) drawn today and your tests are completely normal, you will receive your results only by: MyChart Message (if you have MyChart) OR A paper copy in the mail If you have any lab test that is abnormal or we need to change your treatment, we will call you to review the results.   Testing/Procedures:  Your physician has requested that you have an echocardiogram. Echocardiography is a painless test that uses sound waves to create images of your heart. It provides your doctor with information about the size and shape of your heart and how well your heart's chambers and valves are working. This procedure takes approximately one hour. There are no restrictions for this procedure. Please do NOT wear cologne, perfume, aftershave, or lotions (deodorant is allowed). Please arrive 15 minutes prior to your appointment time.   Your physician has recommended that you wear an event monitor. Event monitors are medical devices that record the heart's electrical activity. Doctors most often [place] these monitors to diagnose arrhythmias. Arrhythmias are problems with the speed or rhythm of the heartbeat. The monitor is a small, portable device. You can wear one while you do your normal daily activities. This is usually used to diagnose what is causing palpitations/syncope (passing out).     Follow-Up: At [facility], you and your health needs are our priority.  As part of our continuing mission to provide you with exceptional heart care, we have created designated Provider Care Teams.  These Care Teams include your primary Cardiologist (physician) and Advanced Practice Providers (APPs -  Physician Assistants and Nurse Practitioners) who all work  together to provide you with the care you need, when you need it.  We recommend signing up for the patient portal called "MyChart".  Sign up information is provided on this After Visit Summary.  MyChart is used to connect with patients for Virtual Visits (Telemedicine).  Patients are able to view lab/test results, encounter notes, upcoming appointments, etc.  Non-urgent messages can be sent to your provider as well.   To learn more about what you can do with MyChart, go to [email or web address].    Your next appointment:   6 week(s)  Provider:   Francis Dowse, PA-C   Other Instructions  Brian Myers- Long Term Monitor Instructions  Your physician has requested you wear a ZIO patch monitor for 3 days.  This is a single patch monitor. [employer] supplies one patch monitor per enrollment. Additional stickers are not available. Please do not apply patch if you will be having a Nuclear Stress Test,  Echocardiogram, Cardiac CT, MRI, or Chest Xray during the period you would be wearing the  monitor. The patch cannot be worn during these tests. You cannot remove and re-apply the  ZIO XT patch monitor.  Your ZIO patch monitor will be mailed 3 day USPS to your address on file. It may take 3-5 days  to receive your monitor after you have been enrolled.  Once you have received your monitor, please review the enclosed instructions. Your monitor  has already been registered assigning a specific monitor serial # to you.  Billing and Patient Assistance Program Information  We have supplied [employer] with any of your insurance information on file  for billing purposes. [employer] offers a sliding scale Patient Assistance Program for patients that do not have  insurance, or whose insurance does not completely cover the cost of the ZIO monitor.  You must apply for the Patient Assistance Program to qualify for this discounted rate.  To apply, please call [employer] at [phone], select option 4, select option 2, ask to  apply for  Patient Assistance Program. [name] will ask your household income, and how many people  are in your household. They will quote your out-of-pocket cost based on that information.  [employer] will also be able to set up a 73-month, interest-free payment plan if needed.  Applying the monitor   Shave hair from upper left chest.  Hold abrader disc by orange tab. Rub abrader in 40 strokes over the upper left chest as  indicated in your monitor instructions.  Clean area with 4 enclosed alcohol pads. Let dry.  Apply patch as indicated in monitor instructions. Patch will be placed under collarbone on left  side of chest with arrow pointing upward.  Rub patch adhesive wings for 2 minutes. Remove white label marked "1". Remove the white  label marked "2". Rub patch adhesive wings for 2 additional minutes.  While looking in a mirror, press and release button in center of patch. A small green light will  flash 3-4 times. This will be your only indicator that the monitor has been turned on.  Do not shower for the first 24 hours. You may shower after the first 24 hours.  Press the button if you feel a symptom. You will hear a small click. Record Date, Time and  Symptom in the Patient Logbook.  When you are ready to remove the patch, follow instructions on the last 2 pages of Patient  Logbook. Stick patch monitor onto the last page of Patient Logbook.  Place Patient Logbook in the blue and white box. Use locking tab on box and tape box closed  securely. The blue and white box has prepaid postage on it. Please place it in the mailbox as  soon as possible. Your physician should have your test results approximately 7 days after the  monitor has been mailed back to [facility].  Call [facility] Customer Care at [phone] if you have questions regarding  your ZIO XT patch monitor. Call them immediately if you see an orange light blinking on your  monitor.  If your monitor falls off in  less than 4 days, contact our Monitor department at [phone].  If your monitor becomes loose or falls off after 4 days call [employer] at [phone] for  suggestions on securing your monitor

## 2022-06-06 NOTE — Progress Notes (Unsigned)
Enrolled for [employer] to mail a ZIO XT long term holter monitor to the patients address on file.   DOD to read. (No new cardiologist assigned)

## 2022-06-07 ENCOUNTER — Other Ambulatory Visit: Payer: Self-pay | Admitting: Adult Health

## 2022-06-07 DIAGNOSIS — R0602 Shortness of breath: Secondary | ICD-10-CM | POA: Diagnosis not present

## 2022-06-07 LAB — CBC
Hematocrit: 31.9 %
Hemoglobin: 10.2 g/dL
MCH: 23.7 pg
MCHC: 32 g/dL
MCV: 74 fL
Platelets: 282 10*3/uL (ref 150–450)
RBC: 4.31 x10E6/uL
RDW: 18.5 %
WBC: 8.4 10*3/uL (ref 3.4–10.8)

## 2022-06-07 LAB — BASIC METABOLIC PANEL
BUN/Creatinine Ratio: 16 (ref 10–24)
BUN: 15 mg/dL (ref 8–27)
CO2: 22 mmol/L (ref 20–29)
Calcium: 9 mg/dL (ref 8.6–10.2)
Chloride: 103 mmol/L (ref 96–106)
Creatinine, Ser: 0.96 mg/dL (ref 0.76–1.27)
Glucose: 140 mg/dL — ABNORMAL HIGH (ref 70–99)
Potassium: 3.9 mmol/L (ref 3.5–5.2)
Sodium: 143 mmol/L (ref 134–144)
eGFR: 83 mL/min/{1.73_m2} (ref >59)

## 2022-06-09 ENCOUNTER — Ambulatory Visit: Payer: Medicare Other | Admitting: Adult Health

## 2022-06-15 ENCOUNTER — Encounter: Payer: Self-pay | Admitting: Adult Health

## 2022-06-15 ENCOUNTER — Ambulatory Visit (INDEPENDENT_AMBULATORY_CARE_PROVIDER_SITE_OTHER): Payer: Medicare Other | Admitting: Adult Health

## 2022-06-15 VITALS — BP 117/70 | Ht 71.0 in | Wt 276.0 lb

## 2022-06-15 DIAGNOSIS — G4733 Obstructive sleep apnea (adult) (pediatric): Secondary | ICD-10-CM

## 2022-06-15 NOTE — Progress Notes (Signed)
PATIENT: Brian Myers DOB: 02/25/1946  REASON FOR VISIT: follow up HISTORY FROM: patient  Chief Complaint  Patient presents with   Follow-up    Pt in 19, alone. Pt here for CPAP f/u.  States his machine is giving "motor life exceeded" message but he is still wearing it and he states it is functioning ok. He wanted to wait until this appointment to discuss. Patient was short of breath when walking back to room and states that has been getting worse and attributes it to his A fib and has upcoming test with cardiologist.     HISTORY OF PRESENT ILLNESS: Today 06/15/22:  Brian Myers is a 77 y.o. male with a history of obstructive sleep apnea on CPAP. Returns today for follow-up.  Patient reports that his CPAP machine has stated that it has exceeded motor life.  He will need a new machine.  He feels that the CPAP is working well.  Denies any issues.  He does state that he gets short of breath and attributes this to her A-fib.  Reports that he has an upcoming appointment with cardiology.  His download is below       06/01/21: Brian Myers is a 77 year old male with a history of obstructive sleep apnea on CPAP.  He returns today for follow-up.  He reports that the CPAP is working well for him.  He denies any new issues.  In the past we did order a CPAP titration due to an abnormal overnight pulse oximetry however the patient deferred then.  When asked about it now he still does not wish to complete this test     05/04/20: Brian Myers is a 77 year old male with a history of obstructive sleep apnea on CPAP.  He returns today for follow-up.  The patient was scheduled for a CPAP titration to confirm overnight pulse oximetry results.  The patient canceled this.  He does not want to proceed with this at this time.  He states that he does not sleep well when he comes and to the office for the studies.  Overall he states that the CPAP continues to work well for him.  He denies any significant  issues.    HISTORY 07/01/19:  Brian Myers is a 77 year old male with a history of obstructive sleep apnea on CPAP.  He joins me today for a virtual visit.  The patient wanted to review his overnight pulse oximetry in more detail before scheduling CPAP titration.  I advised that his overnight pulse oximetry showed that he was less than 88% for approximately 4 hours.  This would qualify him for nocturnal oxygen but this should be verified with a CPAP titration.  Patient voiced understanding.  He is ready to schedule CPAP titration   REVIEW OF SYSTEMS: Out of a complete 14 system review of symptoms, the patient complains only of the following symptoms, and all other reviewed systems are negative.  FSS 20 ESS 4  ALLERGIES: Allergies  Allergen Reactions   Penicillins Anaphylaxis   Sulfa Drugs Cross Reactors Anaphylaxis   Statins Other (See Comments)    INTOLERANT TO STATINS - SEVERE MUSCLE CRAMPS   Omeprazole Other (See Comments)    HOME MEDICATIONS: Outpatient Medications Prior to Visit  Medication Sig Dispense Refill   albuterol (VENTOLIN HFA) 108 (90 Base) MCG/ACT inhaler Inhale 1 puff into the lungs every 4 (four) hours as needed for wheezing or shortness of breath. 6.7 g 2   cyanocobalamin (VITAMIN B12) 1000 MCG tablet  Take 1,000 mcg by mouth daily.     dabigatran (PRADAXA) 150 MG CAPS capsule Take 1 capsule (150 mg total) by mouth every 12 (twelve) hours. Overdue for follow-up, MUST see MD for FUTURE refills. 180 capsule 1   esomeprazole (NEXIUM) 20 MG capsule Take 20 mg by mouth 2 (two) times daily.  1   finasteride (PROSCAR) 5 MG tablet Take 5 mg by mouth daily.     fluticasone (FLONASE) 50 MCG/ACT nasal spray Place 1 spray into both nostrils 2 (two) times a day.     losartan (COZAAR) 50 MG tablet Take 1 tablet (50 mg total) by mouth daily. 90 tablet 1   methylcellulose (ARTIFICIAL TEARS) 1 % ophthalmic solution Place 1 drop into both eyes 2 (two) times daily as needed (dry eyes).      SYMBICORT 160-4.5 MCG/ACT inhaler INHALE 2 PUFFS TWICE DAILY 30.6 each 1   Vitamin D, Ergocalciferol, (DRISDOL) 1.25 MG (50000 UNIT) CAPS capsule Take 1 capsule (50,000 Units total) by mouth every 7 (seven) days. 12 capsule 0   diltiazem (CARDIZEM CD) 360 MG 24 hr capsule Take 1 capsule (360 mg total) by mouth daily. 90 capsule 1   doxycycline (MONODOX) 100 MG capsule Take 100 mg by mouth 2 (two) times daily. For one week     promethazine-dextromethorphan (PROMETHAZINE-DM) 6.25-15 MG/5ML syrup Take 5 mLs by mouth 4 (four) times daily as needed. 118 mL 0   No facility-administered medications prior to visit.    PAST MEDICAL HISTORY: Past Medical History:  Diagnosis Date   Arthritis    KNEES   Asthma    Bladder problem    enlarged bladder   BPH (benign prostatic hypertrophy)    DIFFICULTY GETTING STREAM STARTED   Eczema    HANDS   GERD (gastroesophageal reflux disease)    H/O hiatal hernia    Hyperlipidemia    Hyperlipidemia    DOES NOT TOLERATE ANY OF THE STATINS   Persistent atrial fibrillation    Dr Katrinka Blazing   Pneumonia    LAST EPISODE JAN 2015   Right knee meniscal tear    PAIN RT KNEE   Sleep apnea    with cpap    PAST SURGICAL HISTORY: Past Surgical History:  Procedure Laterality Date   CARDIOVERSION N/A 06/10/2020   Procedure: CARDIOVERSION;  Surgeon: Parke Poisson, MD;  Location: [facility] ENDOSCOPY;  Service: Cardiovascular;  Laterality: N/A;   CERVICAL FUSION  1991   KNEE ARTHROSCOPY Right 04/17/2013   Procedure: ARTHROSCOPY RIGHT KNEE WITH MEDIAL AND LATERAL DEBRIDEMENT AND CONDRAPLASTY;  Surgeon: Loanne Drilling, MD;  Location: [facility] ORS;  Service: Orthopedics;  Laterality: Right;   LEFT KNEE ARTHROSCPY  2009   TEE WITHOUT CARDIOVERSION N/A 06/10/2020   Procedure: TRANSESOPHAGEAL ECHOCARDIOGRAM (TEE);  Surgeon: Parke Poisson, MD;  Location: [facility] ENDOSCOPY;  Service: Cardiovascular;  Laterality: N/A;    FAMILY HISTORY: Family History  Problem Relation Age of  Onset   Arrhythmia Mother        atrial fib   Mitral valve prolapse Mother    Hypertension Father     SOCIAL HISTORY: Social History   Socioeconomic History   Marital status: Married    Spouse name: Not on file   Number of children: 2   Years of education: Not on file   Highest education level: Not on file  Occupational History    Employer: [employer]  Tobacco Use   Smoking status: Never   Smokeless tobacco: Never  Vaping Use   Vaping Use: Never used  Substance and Sexual Activity   Alcohol use: Yes    Alcohol/week: 14.0 - 21.0 standard drinks of alcohol    Types: 14 - 21 Glasses of wine per week    Comment: at least 2-3 glasses of wine per day   Drug use: No   Sexual activity: Not on file  Other Topics Concern   Not on file  Social History Narrative   Lives in [place] with spouse.  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   Right handed   Caffeine: tea 1 cup/day   Social Determinants of Health   Financial Resource Strain: Not on file  Food Insecurity: Not on file  Transportation Needs: Not on file  Physical Activity: Not on file  Stress: Not on file  Social Connections: Not on file  Intimate Partner Violence: Not on file      PHYSICAL EXAM  Vitals:   06/15/22 1433  BP: 117/70  SpO2: 96%  Weight: 276 lb (125.2 kg)  Height: 5\' 11"  (1.803 m)    Body mass index is 38.49 kg/m.  Generalized: Well developed, in no acute distress  Chest: Lungs clear to auscultation bilaterally. Afib noted  Neurological examination  Mentation: Alert oriented to time, place, history taking. Follows all commands speech and language fluent Cranial nerve II-XII: Facial symmetry noted  DIAGNOSTIC DATA (LABS, IMAGING, TESTING) - I reviewed patient records, labs, notes, testing and imaging myself where available.  Lab Results  Component Value Date   WBC 8.4 06/07/2022   HGB 10.2 06/07/2022   HCT 31.9 06/07/2022   MCV 74 06/07/2022   PLT 282 06/07/2022       Component Value Date/Time   NA 143 06/06/2022 1509   K 3.9 06/06/2022 1509   CL 103 06/06/2022 1509   CO2 22 06/06/2022 1509   GLUCOSE 140 (H) 06/06/2022 1509   GLUCOSE 98 01/06/2022 1128   BUN 15 06/06/2022 1509   CREATININE 0.96 06/06/2022 1509   CREATININE 0.98 11/27/2015 1442   CALCIUM 9.0 06/06/2022 1509   PROT 7.8 01/06/2022 1128   ALBUMIN 4.1 01/06/2022 1128   AST 47 (H) 01/06/2022 1128   ALT 43 01/06/2022 1128   ALKPHOS 52 01/06/2022 1128   BILITOT 0.6 01/06/2022 1128   GFRNONAA 85 (L) 04/12/2013 1500   GFRAA >90 04/12/2013 1500   Lab Results  Component Value Date   CHOL 217 (H) 01/06/2022   HDL 50.50 01/06/2022   LDLCALC 150 (H) 01/06/2022   TRIG 85.0 01/06/2022   CHOLHDL 4 01/06/2022   Lab Results  Component Value Date   HGBA1C 7.1 (H) 01/06/2022   Lab Results  Component Value Date   VITAMINB12 312 01/06/2022   Lab Results  Component Value Date   TSH 3.47 01/04/2021      ASSESSMENT AND PLAN 77 y.o. year old male  has a past medical history of Arthritis, Asthma, Bladder problem, BPH (benign prostatic hypertrophy), Eczema, GERD (gastroesophageal reflux disease), H/O hiatal hernia, Hyperlipidemia, Hyperlipidemia, Persistent atrial fibrillation, Pneumonia, Right knee meniscal tear, and Sleep apnea. here with:  OSA on CPAP  - CPAP compliance excellent -Residual AHI has continued to increase.  Patient will be sent for CPAP titration with the capability of transitioning to BiPAP if needed - Encourage patient to use CPAP nightly and > 4 hours each night - F/U after CPAP titration    [name], MSN, NP-C 06/15/2022, 2:30 PM [facility] [address], [address]  [place], [place] [address] [phone]

## 2022-06-16 DIAGNOSIS — R0602 Shortness of breath: Secondary | ICD-10-CM

## 2022-06-16 DIAGNOSIS — I480 Paroxysmal atrial fibrillation: Secondary | ICD-10-CM | POA: Diagnosis not present

## 2022-06-27 ENCOUNTER — Encounter: Payer: Self-pay | Admitting: Adult Health

## 2022-06-28 DIAGNOSIS — R0602 Shortness of breath: Secondary | ICD-10-CM | POA: Diagnosis not present

## 2022-06-28 DIAGNOSIS — I480 Paroxysmal atrial fibrillation: Secondary | ICD-10-CM | POA: Diagnosis not present

## 2022-07-04 ENCOUNTER — Ambulatory Visit (INDEPENDENT_AMBULATORY_CARE_PROVIDER_SITE_OTHER): Payer: Medicare Other

## 2022-07-04 DIAGNOSIS — R0602 Shortness of breath: Secondary | ICD-10-CM

## 2022-07-04 LAB — ECHOCARDIOGRAM COMPLETE
AR max vel: 1.83 cm2
AV Area VTI: 2.08 cm2
AV Area mean vel: 1.81 cm2
AV Mean grad: 4 mmHg
AV Peak grad: 8.2 mmHg
Ao pk vel: 1.43 m/s
Area-P 1/2: 3.91 cm2
S' Lateral: 3.13 cm

## 2022-07-06 DIAGNOSIS — H838X3 Other specified diseases of inner ear, bilateral: Secondary | ICD-10-CM | POA: Diagnosis not present

## 2022-07-06 DIAGNOSIS — H6123 Impacted cerumen, bilateral: Secondary | ICD-10-CM | POA: Diagnosis not present

## 2022-07-06 DIAGNOSIS — H903 Sensorineural hearing loss, bilateral: Secondary | ICD-10-CM | POA: Diagnosis not present

## 2022-07-11 ENCOUNTER — Ambulatory Visit: Payer: Medicare Other | Admitting: Internal Medicine

## 2022-07-12 DIAGNOSIS — H25812 Combined forms of age-related cataract, left eye: Secondary | ICD-10-CM | POA: Diagnosis not present

## 2022-07-13 ENCOUNTER — Telehealth: Payer: Self-pay | Admitting: Adult Health

## 2022-07-13 DIAGNOSIS — R972 Elevated prostate specific antigen [PSA]: Secondary | ICD-10-CM | POA: Diagnosis not present

## 2022-07-13 NOTE — Telephone Encounter (Signed)
CPAP- Medicare/[employer] supp no auth req   Patient is scheduled at [facility] for 07/17/22 at 8 pm.  Emailed the patient with appointment information.

## 2022-07-15 ENCOUNTER — Ambulatory Visit (INDEPENDENT_AMBULATORY_CARE_PROVIDER_SITE_OTHER): Payer: Medicare Other | Admitting: Internal Medicine

## 2022-07-15 VITALS — BP 140/80 | HR 91 | Temp 98.1°F | Ht 71.0 in | Wt 276.0 lb

## 2022-07-15 DIAGNOSIS — E538 Deficiency of other specified B group vitamins: Secondary | ICD-10-CM | POA: Diagnosis not present

## 2022-07-15 DIAGNOSIS — E785 Hyperlipidemia, unspecified: Secondary | ICD-10-CM | POA: Diagnosis not present

## 2022-07-15 DIAGNOSIS — T466X5A Adverse effect of antihyperlipidemic and antiarteriosclerotic drugs, initial encounter: Secondary | ICD-10-CM | POA: Diagnosis not present

## 2022-07-15 DIAGNOSIS — G320 Subacute combined degeneration of spinal cord in diseases classified elsewhere: Secondary | ICD-10-CM

## 2022-07-15 DIAGNOSIS — E1169 Type 2 diabetes mellitus with other specified complication: Secondary | ICD-10-CM | POA: Diagnosis not present

## 2022-07-15 DIAGNOSIS — Z0001 Encounter for general adult medical examination with abnormal findings: Secondary | ICD-10-CM

## 2022-07-15 DIAGNOSIS — I4819 Other persistent atrial fibrillation: Secondary | ICD-10-CM | POA: Diagnosis not present

## 2022-07-15 DIAGNOSIS — G720 Drug-induced myopathy: Secondary | ICD-10-CM

## 2022-07-15 DIAGNOSIS — E118 Type 2 diabetes mellitus with unspecified complications: Secondary | ICD-10-CM

## 2022-07-15 LAB — POCT GLYCOSYLATED HEMOGLOBIN (HGB A1C): HbA1c POC (<> result, manual entry): 6 % (ref 4.0–5.6)

## 2022-07-15 LAB — HM DIABETES EYE EXAM

## 2022-07-15 NOTE — Assessment & Plan Note (Signed)
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

## 2022-07-15 NOTE — Assessment & Plan Note (Signed)
Flu shot yearly. Pneumonia complete. Shingrix due at pharmacy. Tetanus due 2029. Colonoscopy due now. Counseled about sun safety and mole surveillance. Counseled about the dangers of distracted driving. Given 10 year screening recommendations.

## 2022-07-15 NOTE — Assessment & Plan Note (Signed)
Taking oral B12 daily and stable overall.

## 2022-07-15 NOTE — Assessment & Plan Note (Signed)
Counseled that he can hold predaxa for colonoscopy upcoming with low risk for complications.

## 2022-07-15 NOTE — Patient Instructions (Signed)
Your HgA1c 6.0 today which is good.

## 2022-07-15 NOTE — Assessment & Plan Note (Signed)
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

## 2022-07-15 NOTE — Progress Notes (Signed)
Subjective:   Patient ID: Brian Myers, male    DOB: 07/13/1945, 77 y.o.   MRN: [id number]  HPI Here for medicare wellness and follow up conditions no new complaints. Please see A/P for status and treatment of chronic medical problems.   Diet: DM since diabetic Physical activity: sedentary Depression/mood screen: negative Hearing: moderate loss getting hearing aids soon Visual acuity: grossly normal, performs annual eye exam  ADLs: capable Fall risk: none Home safety: good Cognitive evaluation: intact to orientation, naming, recall and repetition EOL planning: adv directives discussed  Flowsheet Row Office Visit from 07/15/2022 in [facility]  PHQ-2 Total Score 0       Flowsheet Row Office Visit from 07/15/2022 in [facility] [place]  PHQ-9 Total Score 0         06/10/2020   10:16 AM 10/12/2021   10:36 AM 01/06/2022   11:01 AM 07/15/2022    1:48 PM  Fall Risk  Falls in the past year?  0 0 0  Was there an injury with Fall?  0 0 0  Fall Risk Category Calculator  0 0 0  Fall Risk Category (Retired)  Low Low   (RETIRED) Patient Fall Risk Level High fall risk Low fall risk Low fall risk   (RETIRED) Patient Fall Risk Level - Comments sedation today     Patient at Risk for Falls Due to  No Fall Risks    Fall risk Follow up  Falls evaluation completed Falls evaluation completed Falls evaluation completed   I have personally reviewed and have noted 1. The patient's medical and social history - reviewed today no changes 2. Their use of alcohol, tobacco or illicit drugs 3. Their current medications and supplements 4. The patient's functional ability including ADL's, fall risks, home safety risks and hearing or visual impairment. 5. Diet and physical activities 6. Evidence for depression or mood disorders 7. Care team reviewed and updated 8.  The patient is not on an opioid pain medication.  Patient Care  Team: Myrlene Broker, MD as PCP - General (Internal Medicine) Lyn Records, MD (Inactive) as PCP - Cardiology (Cardiology) Hillis Range, MD (Inactive) as PCP - Electrophysiology (Cardiology) Shon Millet, MD as Consulting Physician (Ophthalmology) Past Medical History:  Diagnosis Date   Arthritis    KNEES   Asthma    Bladder problem    enlarged bladder   BPH (benign prostatic hypertrophy)    DIFFICULTY GETTING STREAM STARTED   Eczema    HANDS   GERD (gastroesophageal reflux disease)    H/O hiatal hernia    Hyperlipidemia    Hyperlipidemia    DOES NOT TOLERATE ANY OF THE STATINS   Persistent atrial fibrillation (HCC)    Dr Katrinka Blazing   Pneumonia    LAST EPISODE JAN 2015   Right knee meniscal tear    PAIN RT KNEE   Sleep apnea    with cpap   Past Surgical History:  Procedure Laterality Date   CARDIOVERSION N/A 06/10/2020   Procedure: CARDIOVERSION;  Surgeon: Parke Poisson, MD;  Location: [facility] ENDOSCOPY;  Service: Cardiovascular;  Laterality: N/A;   CERVICAL FUSION  1991   KNEE ARTHROSCOPY Right 04/17/2013   Procedure: ARTHROSCOPY RIGHT KNEE WITH MEDIAL AND LATERAL DEBRIDEMENT AND CONDRAPLASTY;  Surgeon: Loanne Drilling, MD;  Location: [facility] ORS;  Service: Orthopedics;  Laterality: Right;   LEFT KNEE ARTHROSCPY  2009   TEE WITHOUT CARDIOVERSION N/A 06/10/2020  Procedure: TRANSESOPHAGEAL ECHOCARDIOGRAM (TEE);  Surgeon: Parke Poisson, MD;  Location: [facility] ENDOSCOPY;  Service: Cardiovascular;  Laterality: N/A;   Family History  Problem Relation Age of Onset   Arrhythmia Mother        atrial fib   Mitral valve prolapse Mother    Hypertension Father    Review of Systems  Constitutional: Negative.   HENT: Negative.    Eyes: Negative.   Respiratory:  Negative for cough, chest tightness and shortness of breath.   Cardiovascular:  Negative for chest pain, palpitations and leg swelling.  Gastrointestinal:  Negative for abdominal distention, abdominal pain,  constipation, diarrhea, nausea and vomiting.  Musculoskeletal: Negative.   Skin: Negative.   Neurological: Negative.   Psychiatric/Behavioral: Negative.      Objective:  Physical Exam Constitutional:      Appearance: He is well-developed. He is obese.  HENT:     Head: Normocephalic and atraumatic.  Cardiovascular:     Rate and Rhythm: Normal rate and regular rhythm.  Pulmonary:     Effort: Pulmonary effort is normal. No respiratory distress.     Breath sounds: Normal breath sounds. No wheezing or rales.  Abdominal:     General: Bowel sounds are normal. There is no distension.     Palpations: Abdomen is soft.     Tenderness: There is no abdominal tenderness. There is no rebound.  Musculoskeletal:     Cervical back: Normal range of motion.  Skin:    General: Skin is warm and dry.  Neurological:     Mental Status: He is alert and oriented to person, place, and time.     Coordination: Coordination normal.     Vitals:   07/15/22 1345 07/15/22 1348  BP: (!) 140/80 (!) 140/80  Pulse: 91   Temp: 98.1 F (36.7 C)   TempSrc: Oral   SpO2: 97%   Weight: 257 lb (116.6 kg) 276 lb (125.2 kg)  Height: 5\' 11"  (1.803 m)     Assessment & Plan:

## 2022-07-15 NOTE — Assessment & Plan Note (Signed)
POC HgA1c done today at 6.0. Weight is stable and diet stable. Encouraged to work on adding exercise. Eye exam up to date. On ARB. Statin myopathy.

## 2022-07-15 NOTE — Assessment & Plan Note (Addendum)
Weight is stable and counseled that his new SOB could be related to abdominal adiposity. Encouraged diet and exercise to help. BMI 38 and complicated by diabetes and hyperlipidemia among other complications.

## 2022-07-17 ENCOUNTER — Ambulatory Visit (INDEPENDENT_AMBULATORY_CARE_PROVIDER_SITE_OTHER): Payer: Medicare Other | Admitting: Neurology

## 2022-07-17 DIAGNOSIS — G4733 Obstructive sleep apnea (adult) (pediatric): Secondary | ICD-10-CM

## 2022-07-17 DIAGNOSIS — D500 Iron deficiency anemia secondary to blood loss (chronic): Secondary | ICD-10-CM

## 2022-07-17 DIAGNOSIS — I4819 Other persistent atrial fibrillation: Secondary | ICD-10-CM

## 2022-07-17 DIAGNOSIS — G720 Drug-induced myopathy: Secondary | ICD-10-CM

## 2022-07-17 NOTE — Progress Notes (Unsigned)
Cardiology Office Note Date:  07/17/2022  Patient ID:  Brian Myers, Brian Myers [birth year], MRN [id number] PCP:  Myrlene Broker, MD  Cardiologist:  Dr. Katrinka Blazing Electrophysiologist: Dr. Johney Frame [id number])    Chief Complaint:  f/u  History of Present Illness: Brian Myers is a 77 y.o. male with history of OSA w/CPAP, HLD, DM, morbid obesity  He saw Dr. Katrinka Blazing Sept 2021, c/w palpitations though unable to catch rhythm strips via his wearable tech.  Described as brief, no prolonged episodes, remained on flecainide.  Dr. Katrinka Blazing mentioned PAC's/PVCs, if started to have prolonged episodes perhaps consider a monitor. Mentioned dilt was historically reduced 2/2 BP  I saw him 06/03/20 He is accompanied by his wife. Reports that he was doing some light cleaning in the garage and looked at his watch to see what time it was and noted his HR was 120's He felt well, had no perception of palpitations, fast HR No CP, SOB or unusual DOE. No dizzy spells, near syncope or syncope Hs usual resting HR is in the 50's Reports compliance with his CPAP No bleeding or signs of bleeding He in review of his watch data HRs 110's-120's since Friday afternoon He was in fast AF Planned for TEE/DCCV with missed pradaxa and f/u with Dr Johney Frame, nodal blocker not increased with baseline SB   Last saw EP/Dr. Johney Frame 02/05/21: diltiazem dose increased, discussed ablation and Tikosyn, pt preferred to continue rate control strategy  He saw H. Lisabeth Devoid, PA-C 05/12/21 via tele visit for pre-op pending colonoscopy  I saw him 06/06/22 In the last year he feels like he has become more symptomatic with his AFib, HR elevates quicker with less exertion, gets fatigued easier. No CP Some awareness of his heart beat No near syncope or syncope. No bleeding or signs of bleeding Given: Declining exertional capacity, higher HRs with less exertion. He would like to see if we could consider rhythm control. Discussed harder to be successful  since he has been in AF a couple years Planned for an echo, 3 day monitor for HR control and DCCV to see if we could gain any SR  TTE w/preserved LVEF , mild asymmetric septal hypertrophy, no significant VHD  Pesistent atrial fibrillation.   Range 42 bpm to 158 bpm, average rate 82 bpm.   One wide complex run of 10 beats (average 127 bpm) Persistent, rate controlled atrial fibrillation.   TODAY He is accompanied by his wife Feels about the same, no energy, tired, easily fatigued, not overtly SOB No CP, no real palpitations No near syncope or syncope. No bleeding or signs of bleeding  He reports excellent medication compliance, never misses Pradaxa, with certainty in the last 3-4 weeks, even longer/if ever   Afib Hx Diagnosed 20+ years ago  AAD Hx Flecainide started 2012, is current > stopped May 22, with recurrent AF Rate control strategy at pt preference Dec 2022   Past Medical History:  Diagnosis Date   Arthritis    KNEES   Asthma    Bladder problem    enlarged bladder   BPH (benign prostatic hypertrophy)    DIFFICULTY GETTING STREAM STARTED   Eczema    HANDS   GERD (gastroesophageal reflux disease)    H/O hiatal hernia    Hyperlipidemia    Hyperlipidemia    DOES NOT TOLERATE ANY OF THE STATINS   Persistent atrial fibrillation (HCC)    Dr Katrinka Blazing   Pneumonia    LAST EPISODE JAN 2015  Right knee meniscal tear    PAIN RT KNEE   Sleep apnea    with cpap    Past Surgical History:  Procedure Laterality Date   CARDIOVERSION N/A 06/10/2020   Procedure: CARDIOVERSION;  Surgeon: Parke Poisson, MD;  Location: [facility] ENDOSCOPY;  Service: Cardiovascular;  Laterality: N/A;   CERVICAL FUSION  1991   KNEE ARTHROSCOPY Right 04/17/2013   Procedure: ARTHROSCOPY RIGHT KNEE WITH MEDIAL AND LATERAL DEBRIDEMENT AND CONDRAPLASTY;  Surgeon: Loanne Drilling, MD;  Location: [facility] ORS;  Service: Orthopedics;  Laterality: Right;   LEFT KNEE ARTHROSCPY  2009   TEE WITHOUT CARDIOVERSION  N/A 06/10/2020   Procedure: TRANSESOPHAGEAL ECHOCARDIOGRAM (TEE);  Surgeon: Parke Poisson, MD;  Location: [facility] ENDOSCOPY;  Service: Cardiovascular;  Laterality: N/A;    Current Outpatient Medications  Medication Sig Dispense Refill   albuterol (VENTOLIN HFA) 108 (90 Base) MCG/ACT inhaler Inhale 1 puff into the lungs every 4 (four) hours as needed for wheezing or shortness of breath. 6.7 g 2   cyanocobalamin (VITAMIN B12) 1000 MCG tablet Take 1,000 mcg by mouth daily.     dabigatran (PRADAXA) 150 MG CAPS capsule Take 1 capsule (150 mg total) by mouth every 12 (twelve) hours. Overdue for follow-up, MUST see MD for FUTURE refills. 180 capsule 1   diltiazem (CARDIZEM CD) 360 MG 24 hr capsule Take 360 mg by mouth daily.     esomeprazole (NEXIUM) 20 MG capsule Take 20 mg by mouth 2 (two) times daily.  1   finasteride (PROSCAR) 5 MG tablet Take 5 mg by mouth daily.     fluticasone (FLONASE) 50 MCG/ACT nasal spray Place 1 spray into both nostrils 2 (two) times a day.     losartan (COZAAR) 50 MG tablet Take 1 tablet (50 mg total) by mouth daily. 90 tablet 1   methylcellulose (ARTIFICIAL TEARS) 1 % ophthalmic solution Place 1 drop into both eyes 2 (two) times daily as needed (dry eyes).     SYMBICORT 160-4.5 MCG/ACT inhaler INHALE 2 PUFFS TWICE DAILY 30.6 each 1   Vitamin D, Ergocalciferol, (DRISDOL) 1.25 MG (50000 UNIT) CAPS capsule Take 1 capsule (50,000 Units total) by mouth every 7 (seven) days. 12 capsule 0   No current facility-administered medications for this visit.    Allergies:   Penicillins, Sulfa drugs cross reactors, Statins, and Omeprazole   Social History:  The patient  reports that he has never smoked. He has never used smokeless tobacco. He reports current alcohol use of about 28.0 standard drinks of alcohol per week. He reports that he does not use drugs.   Family History:  The patient's family history includes Arrhythmia in his mother; Hypertension in his father; Mitral valve  prolapse in his mother.  ROS:  Please see the history of present illness.    All other systems are reviewed and otherwise negative.   PHYSICAL EXAM:  VS:  There were no vitals taken for this visit. BMI: There is no height or weight on file to calculate BMI. Well nourished, well developed, in no acute distress HEENT: normocephalic, atraumatic Neck: no JVD, carotid bruits or masses Cardiac: irreg-irreg; no significant murmurs, no rubs, or gallops Lungs: CTA b/l, no wheezing, rhonchi or rales Abd: soft, nontender, obese MS: no deformity or atrophy Ext trace-1+ (he reports his baseline) Skin: warm and dry, no rash Neuro:  No gross deficits appreciated Psych: euthymic mood, full affect   EKG:  Done today and reviewed by myself shows  AFib 94bpm, unchanged  07/04/22: TTE 1. Left ventricular ejection fraction, by estimation, is 60 to 65%. The  left ventricle has normal function. The left ventricle has no regional  wall motion abnormalities. There is mild asymmetric left ventricular  hypertrophy of the basal-septal segment.  Left ventricular diastolic parameters are indeterminate.   2. Right ventricular systolic function is normal. The right ventricular  size is normal.   3. Left atrial size was mildly dilated.   4. Right atrial size was mildly dilated.   5. The mitral valve is grossly normal. Trivial mitral valve  regurgitation.   6. The aortic valve is tricuspid. There is mild calcification of the  aortic valve. There is mild thickening of the aortic valve. Aortic valve  regurgitation is not visualized. Aortic valve sclerosis/calcification is  present, without any evidence of  aortic stenosis.   7. Aortic dilatation noted. There is mild dilatation of the aortic root,  measuring 41 mm.   Comparison(s): EF 60%, modearte asymmetrical septal hypertrophy basal and  septal , AOR 42mm.     02/01/21: TTE 1. Left ventricular ejection fraction, by estimation, is 60 to 65%. The  left  ventricle has normal function. The left ventricle has no regional  wall motion abnormalities. There is moderate asymmetric left ventricular  hypertrophy of the basal and septal  segments. Left ventricular diastolic parameters were normal.   2. Right ventricular systolic function is normal. The right ventricular  size is normal.   3. Left atrial size was moderately dilated.   4. No sub costal windows.   5. The mitral valve is abnormal. Trivial mitral valve regurgitation. No  evidence of mitral stenosis.   6. The aortic valve is tricuspid. There is mild calcification of the  aortic valve. There is mild thickening of the aortic valve. Aortic valve  regurgitation is not visualized. Aortic valve sclerosis is present, with  no evidence of aortic valve stenosis.   7. Aortic dilatation noted. There is moderate dilatation of the aortic  root, measuring 42 mm.   8. The inferior vena cava is normal in size with greater than 50%  respiratory variability, suggesting right atrial pressure of 3 mmHg.    08/30/2016: TTE Study Conclusions  - Left ventricle: The cavity size was normal. Wall thickness was    normal. Systolic function was normal. The estimated ejection    fraction was in the range of 60% to 65%. Wall motion was normal;    there were no regional wall motion abnormalities. Features are    consistent with a pseudonormal left ventricular filling pattern,    with concomitant abnormal relaxation and increased filling    pressure (grade 2 diastolic dysfunction).  - Right atrium: The atrium was mildly dilated.    Recent Labs: 01/06/2022: ALT 43 06/06/2022: BUN 15; Creatinine, Ser 0.96; Potassium 3.9; Sodium 143 06/07/2022: Hemoglobin 10.2; Platelets 282  01/06/2022: Cholesterol 217; HDL 50.50; LDL Cholesterol 150; Total CHOL/HDL Ratio 4; Triglycerides 85.0; VLDL 17.0   CrCl cannot be calculated (Patient's most recent lab result is older than the maximum 21 days allowed.).   Wt Readings from Last  3 Encounters:  07/15/22 276 lb (125.2 kg)  06/15/22 276 lb (125.2 kg)  06/06/22 274 lb 6.4 oz (124.5 kg)     Other studies reviewed: Additional studies/records reviewed today include: summarized above  ASSESSMENT AND PLAN:  1. Longstanding persistent AFib     CHA2DS2Vasc is 3, on pradaxa, appropriately dosed      Discussed rhythm control strategies. Will start with  DCCV Discussed procedure, potential risks/benefits, he has had them before, and is agreeable If able to gain SR and maintain for any length of time, assess if he feels better in SR then he did in Afib Consider Tikosyn if needed/QTc in SR allows, I do not see any contraindicated meds on his list Amiodarone would be next  2. HTN     No change today, follow  3. HLD     Not addressed today  4. Secondary hypercoagulable state   We discussed need to transition to a new EP MD, will plan to have him see/meet Dr. Lalla Brothers in a month or so afterwards to re-assess rhythm and management strategies    Disposition: as above, sooner if needed     Current medicines are reviewed at length with the patient today.  The patient did not have any concerns regarding medicines.  Norma Fredrickson, PA-C 07/17/2022 1:33 PM     [facility] [address] [place] [address] [phone] (office)  [phone] (fax)

## 2022-07-19 ENCOUNTER — Encounter: Payer: Self-pay | Admitting: *Deleted

## 2022-07-19 ENCOUNTER — Ambulatory Visit: Payer: Medicare Other | Attending: Physician Assistant | Admitting: Physician Assistant

## 2022-07-19 ENCOUNTER — Encounter: Payer: Self-pay | Admitting: Physician Assistant

## 2022-07-19 VITALS — BP 134/74 | HR 94 | Ht 71.0 in | Wt 280.0 lb

## 2022-07-19 DIAGNOSIS — D6869 Other thrombophilia: Secondary | ICD-10-CM | POA: Diagnosis not present

## 2022-07-19 DIAGNOSIS — I10 Essential (primary) hypertension: Secondary | ICD-10-CM | POA: Diagnosis not present

## 2022-07-19 DIAGNOSIS — I4811 Longstanding persistent atrial fibrillation: Secondary | ICD-10-CM | POA: Insufficient documentation

## 2022-07-19 NOTE — Patient Instructions (Addendum)
Medication Instructions:    Your physician recommends that you continue on your current medications as directed. Please refer to the Current Medication list given to you today.   *If you need a refill on your cardiac medications before your next appointment, please call your pharmacy*   Lab Work:    BMET AND CBC TODAY     If you have labs (blood work) drawn today and your tests are completely normal, you will receive your results only by: MyChart Message (if you have MyChart) OR A paper copy in the mail If you have any lab test that is abnormal or we need to change your treatment, we will call you to review the results.   Testing/Procedures: SEE LETTER ON 07-26-22 FOR  Your physician has recommended that you have a Cardioversion (DCCV). Electrical Cardioversion uses a jolt of electricity to your heart either through paddles or wired patches attached to your chest. This is a controlled, usually prescheduled, procedure. Defibrillation is done under light anesthesia in the hospital, and you usually go home the day of the procedure. This is done to get your heart back into a normal rhythm. You are not awake for the procedure. Please see the instruction sheet given to you today.    Follow-Up: At [facility], you and your health needs are our priority.  As part of our continuing mission to provide you with exceptional heart care, we have created designated Provider Care Teams.  These Care Teams include your primary Cardiologist (physician) and Advanced Practice Providers (APPs -  Physician Assistants and Nurse Practitioners) who all work together to provide you with the care you need, when you need it.  We recommend signing up for the patient portal called "MyChart".  Sign up information is provided on this After Visit Summary.  MyChart is used to connect with patients for Virtual Visits (Telemedicine).  Patients are able to view lab/test results, encounter notes, upcoming appointments,  etc.  Non-urgent messages can be sent to your provider as well.   To learn more about what you can do with MyChart, go to [email or web address].    Your next appointment:  POST DCCV after 07-26-22    1 MONTH   Provider:   You may see Dr. Lalla Brothers  Other Instructions

## 2022-07-20 ENCOUNTER — Other Ambulatory Visit: Payer: Self-pay | Admitting: *Deleted

## 2022-07-20 ENCOUNTER — Encounter: Payer: Self-pay | Admitting: Internal Medicine

## 2022-07-20 DIAGNOSIS — R3914 Feeling of incomplete bladder emptying: Secondary | ICD-10-CM | POA: Diagnosis not present

## 2022-07-20 DIAGNOSIS — R972 Elevated prostate specific antigen [PSA]: Secondary | ICD-10-CM | POA: Diagnosis not present

## 2022-07-20 DIAGNOSIS — D509 Iron deficiency anemia, unspecified: Secondary | ICD-10-CM

## 2022-07-20 DIAGNOSIS — N401 Enlarged prostate with lower urinary tract symptoms: Secondary | ICD-10-CM | POA: Diagnosis not present

## 2022-07-20 LAB — BASIC METABOLIC PANEL
BUN/Creatinine Ratio: 16 (ref 10–24)
BUN: 13 mg/dL (ref 8–27)
CO2: 25 mmol/L (ref 20–29)
Calcium: 9 mg/dL (ref 8.6–10.2)
Chloride: 102 mmol/L (ref 96–106)
Creatinine, Ser: 0.83 mg/dL (ref 0.76–1.27)
Glucose: 118 mg/dL — ABNORMAL HIGH (ref 70–99)
Potassium: 3.9 mmol/L (ref 3.5–5.2)
Sodium: 141 mmol/L (ref 134–144)
eGFR: 92 mL/min/{1.73_m2} (ref >59)

## 2022-07-20 LAB — CBC
Hematocrit: 31.9 % — ABNORMAL LOW (ref 37.5–51.0)
Hemoglobin: 9.5 g/dL — ABNORMAL LOW (ref 13.0–17.7)
MCH: 22.8 pg — ABNORMAL LOW (ref 26.6–33.0)
MCHC: 29.8 g/dL — ABNORMAL LOW (ref 31.5–35.7)
MCV: 77 fL — ABNORMAL LOW (ref 79–97)
Platelets: 276 10*3/uL (ref 150–450)
RBC: 4.16 x10E6/uL (ref 4.14–5.80)
RDW: 16.1 % — ABNORMAL HIGH (ref 11.6–15.4)
WBC: 7.7 10*3/uL (ref 3.4–10.8)

## 2022-07-20 NOTE — Procedures (Signed)
[facility] PAP TITRATION INTERPRETATION REPORT   STUDY DATE: 07/17/2022      PATIENT NAME:  Brian Myers         DATE OF BIRTH:  [birth year]  PATIENT ID:  [id number]    TYPE OF STUDY:  PAP titration  READING PHYSICIAN: Brian Novas, MD SCORING TECHNICIAN: Brian Myers, RPSGT   HISTORY: This 77 year-old Male patient is a long term CPAP user and feels well when using PAP therapy, He has atrial fibrillation, causing SOB. 06/15/22: Brian Penny, NP, Current CPAP auto set between 8 an 18 cm water , 95% pressure at 15 cm water , AHI 11.3/h  no air leak.   He is in urgent need for a new machine.  The Epworth Sleepiness Scale was 0 out of 24 (scores above or equal to 10 are suggestive of hypersomnolence).  DESCRIPTION: A sleep technologist was in attendance for the duration of the recording.  Data collection, scoring, video monitoring, and reporting were performed in compliance with the AASM Manual for the Scoring of Sleep and Associated Events; (Hypopnea is scored based on the criteria listed in Section VIII D. 1b in the AASM Manual V2.6 using a 4% oxygen desaturation rule or Hypopnea is scored based on the criteria listed in Section VIII D. 1a in the AASM Manual V2.6 using 3% oxygen desaturation and /or arousal rule).  A physician certified by the American Board of Sleep Medicine reviewed each epoch of the study.  ADDITIONAL INFORMATION:  Height: 71.0 in Weight: 272 lb (BMI 37) Neck Size: 0.0     MEDICATIONS: Pradaxa, Cardizem CD, Nexium, Flonase, Xopenex HFA, Cozaar, Symbicort, Singulair   SLEEP CONTINUITY AND SLEEP ARCHITECTURE:  Lights off was at 22:10: and lights on 04:51: (6.7 hours in bed). with a decreased sleep efficiency at 59.9%.  The patient was first  started at 8 cm water pressure, using Airtouch F 20 FFM , CPAP was explored up to 1 20 cm water  without resolution. BiPAP was explored from 20/15 cm through 26/ 21 cm water. None reduced the AHI  to the single digits.   Sleep latency was decreased at 4.5 minutes.  Of the total sleep time, the percentage of stage N1 sleep was 24.5%, stage N2 sleep was 74.2%, stage N3 sleep was 0.0%, and REM sleep was 1.2%. There were 1 Stage R periods observed on this study night, 24 awakenings (i.e. transitions to Stage W from any sleep stage), and 77.0 total stage transitions. Wake after sleep onset (WASO) time accounted for 156 minutes.   BODY POSITION: Duration of total sleep and percent of total sleep in their respective position is as follows: supine 55 minutes (22.9%), non-supine 185.5 minutes (77.1%); right 77 minutes (32.0%), left 108 minutes (45.1%), and prone 00 minutes (0.0%). Total supine REM sleep time was 00 minutes (0.0% of total REM sleep).   RESPIRATORY MONITORING:  Based on CMS criteria (using a 4% oxygen desaturation rule for scoring hypopneas), there were 21 apneas (19 obstructive; 0 central; 2 mixed), and 191 hypopneas.  Apnea index was 5.2. Hypopnea index was 47.7. The apnea-hypopnea index was 52.9 overall (61.1 supine, 60.0 non-supine; 60.0 REM, 0.0 supine REM). There were 0 respiratory effort-related arousals (RERAs).   OXIMETRY: Total sleep time spent at, or below 88% was 94.0 minutes, or 39.1% of total sleep time. Respiratory events were associated with oxyhemoglobin desaturations (nadir during sleep 70% from a mean of 90%.  CARDIAC: The electrocardiogram documented arrythmia.  The average heart  rate during sleep was 81 bpm.  The minimum heart rate during sleep was 51 bpm. The maximum heart rate during recording was 104 bpm.   LIMB MOVEMENTS: There were 0 periodic limb movements of sleep (0.0/h), of which 0 (0.0/h) were associated with an arousal. AROUSAL: There were 111 arousals in total, for an arousal index of 24.4 arousals/hour.  Of these, 44 were identified as respiratory-related arousals (11.0 /h), 0 were PLM-related arousals (0.0 /h), and 67 were non-specific arousals (16.7  /h)    IMPRESSION:  The patient was first  started at 8 cm water pressure, using Airtouch F 20 FFM , CPAP was explored up to 1 20 cm water  without resolution. BiPAP was explored from 20/15 cm through 26/ 21 cm water. None reduced the AHI to the single digits. The patient slept non-supine, experienced no central apneas. Most events were hypopneas.    1 Severe Sleep disordered breathing was seen and did not improve on CPAP nor BiPAP. 2. Total sleep time was reduced.  The quality and fragmentation of sleep did not change with positive airway pressure.            RECOMMENDATIONS: As the patient has at home fared well on auto-titration CPAP,  I will order the similar settings for him and hope his AHI remains under 12/h.   CPAP 8 through 20 cm water, 3 cm EPR , humidifier and Air touch FFM , large size.    Brian Novas, MD                    General Information  Name: Brian Myers, Brian Myers BMI: 16.10 Physician: Brian Novas, MD  ID: [id number] Height: 71.0 in Technician: Brian Myers, RPSGT  Sex: Male Weight: 272.0 lb Record: [id number]  Age: 81 [1945-05-31] Date: 07/17/2022     Medical & Medication History    Brian Myers is a 77 y.o. male with a history of obstructive sleep apnea on CPAP. Returns today for follow-up. Patient reports that his CPAP machine has stated that it has exceeded motor life. He will need a new machine. He feels that the CPAP is working well. Denies any issues. He does state that he gets short of breath and attributes this to her A-fib. Reports that he has an upcoming appointment with cardiology.  Pradaxa, Cardizem CD, Nexium, Flonase, Xopenex HFA, Cozaar,Symbicort, Singulair         Comments   Patient arrived for a CPAP titration polysomnogram. Procedure explained and all questions answered. Patient is in need of new equipment, currently using an Auto PAP at 8-16cm/H2O. Standard paste setup without complications. CPAP was started at 8cm  (5cm was too low for patient comfort) using patient mask, a large AirTouch F20 full face mask. Patient slept left, right, and supine. Mild snoring was heard. Respiratory events observed. CPAP was increased to 20cm in an effort to control respiratory events and abolish snoring. Trial of BiPAP started at 20/15, increased to 26/21 cm in an effort to control respiratory events, desaturations, arousals. Cardiac arrhythmias observed. Patient has a known cardiac issue, including atrial fibrillation. No significant PLMS observed. One restroom visit.    CPAP start time: 10:10:13 PM CPAP end time: 04:51:44 AM   Time Total Supine Side Prone Upright  Recording (TRT) 6h 41.84m 1h 2.32m 5h 39.91m 0h 0.37m 0h 0.79m  Sleep (TST) 4h 0.9m 0h 55.59m 3h 5.59m 0h 0.71m 0h 0.87m   Latency N1 N2 N3 REM Onset Per. Slp. Eff.  Actual 0h 4.57m  0h 13.75m 0h 0.75m 2h 28.79m 0h 4.59m 0h 9.68m 59.90%   Stg Dur Wake N1 N2 N3 REM  Total 123.5 59.0 178.5 0.0 3.0  Supine 7.5 11.5 43.5 0.0 0.0  Side 116.0 47.5 135.0 0.0 3.0  Prone 0.0 0.0 0.0 0.0 0.0  Upright 0.0 0.0 0.0 0.0 0.0   Stg % Wake N1 N2 N3 REM  Total 33.9 24.5 74.2 0.0 1.2  Supine 2.1 4.8 18.1 0.0 0.0  Side 31.9 19.8 56.1 0.0 1.2  Prone 0.0 0.0 0.0 0.0 0.0  Upright 0.0 0.0 0.0 0.0 0.0     Apnea Summary Sub Supine Side Prone Upright  Total 21 Total 21 19 2  0 0    REM 0 0 0 0 0    NREM 21 19 2  0 0  Obs 19 REM 0 0 0 0 0    NREM 19 19 0 0 0  Mix 2 REM 0 0 0 0 0    NREM 2 0 2 0 0  Cen 0 REM 0 0 0 0 0    NREM 0 0 0 0 0   Rera Summary Sub Supine Side Prone Upright  Total 0 Total 0 0 0 0 0    REM 0 0 0 0 0    NREM 0 0 0 0 0   Hypopnea Summary Sub Supine Side Prone Upright  Total 204 Total 204 38 166 0 0    REM 3 0 3 0 0    NREM 201 38 163 0 0   4% Hypopnea Summary Sub Supine Side Prone Upright  Total (4%) 191 Total 191 37 154 0 0    REM 3 0 3 0 0    NREM 188 37 151 0 0     AHI Total Obs Mix Cen  56.13 Apnea 5.24 4.74 0.50 0.00   Hypopnea 50.89 -- -- --  52.89  Hypopnea (4%) 47.65 -- -- --    Total Supine Side Prone Upright  Position AHI 56.13 62.18 54.34 0.00 0.00  REM AHI 60.00   NREM AHI 56.08   Position RDI 56.13 62.18 54.34 0.00 0.00  REM RDI 60.00   NREM RDI 56.08    4% Hypopnea Total Supine Side Prone Upright  Position AHI (4%) 52.89 61.09 50.46 0.00 0.00  REM AHI (4%) 60.00   NREM AHI (4%) 52.80   Position RDI (4%) 52.89 61.09 50.46 0.00 0.00  REM RDI (4%) 60.00   NREM RDI (4%) 52.80    Desaturation Information  <100% <90% <80% <70% <60% <50% <40%  Supine 83 80 0 0 0 0 0  Side 298 242 4 0 0 0 0  Prone 0 0 0 0 0 0 0  Upright 0 0 0 0 0 0 0  Total 381 322 4 0 0 0 0  Desaturation threshold setting: 4% Minimum desaturation setting: 10 seconds SaO2 nadir: 66% The longest event was a 27 sec obstructive Hypopnea with a minimum SaO2 of 78%. The lowest SaO2 was 50% associated with a 11 sec obstructive Hypopnea. EKG Rates EKG Avg Max Min  Awake 74 104 54  Asleep 81 102 51   Awakening/Arousal Information # of Awakenings 24  Wake after sleep onset 156.50m  Wake after persistent sleep 155.12m   Arousal Assoc. Arousals Index  Apneas 5 1.2  Hypopneas 39 9.7  Leg Movements 0 0.0  Snore 0.0 0.0  PTT Arousals 0 0.0  Spontaneous 67 16.7  Total 111 27.7  Myoclonus Information  PLMS LMs Index  Total LMs during PLMS 0 0.0  LMs w/ Microarousals 0 0.0   LM LMs Index  w/ Microarousal 0 0.0  w/ Awakening 0 0.0  w/ Resp Event 0 0.0  Spontaneous 0 0.0  Total 0 0.0

## 2022-07-21 ENCOUNTER — Ambulatory Visit: Payer: Medicare Other | Attending: Physician Assistant

## 2022-07-21 DIAGNOSIS — D509 Iron deficiency anemia, unspecified: Secondary | ICD-10-CM | POA: Diagnosis not present

## 2022-07-21 LAB — HEMOGLOBIN AND HEMATOCRIT, BLOOD
Hematocrit: 30.7 % — ABNORMAL LOW (ref 37.5–51.0)
Hemoglobin: 9.6 g/dL — ABNORMAL LOW (ref 13.0–17.7)

## 2022-07-22 NOTE — Progress Notes (Signed)
Spoke with patient, Procedure scheduled for 0730, Please arrive at the hospital at  0630, NPO after midnight on Monday, May take meds with sips of water in the AM, please have transportation for home post procedure, and someone to stay with pt for approximately 24 hours after  Pt states he has been taking blood thinner (med) regularly with no missed doses

## 2022-07-25 ENCOUNTER — Encounter (HOSPITAL_COMMUNITY): Payer: Self-pay | Admitting: Anesthesiology

## 2022-07-25 NOTE — Anesthesia Preprocedure Evaluation (Signed)
Anesthesia Evaluation    Reviewed: Allergy & Precautions, Patient's Chart, lab work & pertinent test results  Airway        Dental   Pulmonary asthma , sleep apnea and Continuous Positive Airway Pressure Ventilation           Cardiovascular hypertension, Pt. on medications + dysrhythmias Atrial Fibrillation      Neuro/Psych  Neuromuscular disease    GI/Hepatic Neg liver ROS, hiatal hernia,GERD  Medicated,,  Endo/Other  diabetes, Type 2  Obesity   Renal/GU negative Renal ROS Bladder dysfunction      Musculoskeletal  (+) Arthritis ,    Abdominal   Peds  Hematology  (+) Blood dyscrasia, anemia   Anesthesia Other Findings   Reproductive/Obstetrics                             Anesthesia Physical Anesthesia Plan  ASA: 3  Anesthesia Plan: General   Post-op Pain Management: Minimal or no pain anticipated   Induction: Intravenous  PONV Risk Score and Plan: 2 and TIVA  Airway Management Planned: Mask  Additional Equipment:   Intra-op Plan:   Post-operative Plan:   Informed Consent:   Plan Discussed with:   Anesthesia Plan Comments:        Anesthesia Quick Evaluation

## 2022-07-26 ENCOUNTER — Encounter (HOSPITAL_COMMUNITY): Admission: RE | Payer: Self-pay | Source: Ambulatory Visit

## 2022-07-26 ENCOUNTER — Ambulatory Visit (HOSPITAL_COMMUNITY): Admission: RE | Admit: 2022-07-26 | Payer: Medicare Other | Source: Ambulatory Visit | Admitting: Internal Medicine

## 2022-07-26 ENCOUNTER — Telehealth: Payer: Self-pay | Admitting: *Deleted

## 2022-07-26 DIAGNOSIS — G4733 Obstructive sleep apnea (adult) (pediatric): Secondary | ICD-10-CM

## 2022-07-26 DIAGNOSIS — I4819 Other persistent atrial fibrillation: Secondary | ICD-10-CM

## 2022-07-26 SURGERY — CARDIOVERSION
Anesthesia: Monitor Anesthesia Care

## 2022-07-26 NOTE — Telephone Encounter (Signed)
Spoke with pt made him aware that Per Dr. Oliva Bustard interpretation of his sleep report his sleep disordered breathing did not improve on CPAP or BiPAP. Therefore she is going to keep him on CPAP 8 to 20 cm of water EPR 3. Pending order today for Megan,NP pt is requesting a new CPAP machine brand name resmed . Set up date 09/2015 Pt thanked me for calling

## 2022-07-26 NOTE — Telephone Encounter (Signed)
-----   Message from [name], NP sent at 07/26/2022  3:54 PM EDT ----- Please let patient know that per Dr. Oliva Bustard interpretation of his sleep report his sleep disordered breathing did not improve on CPAP or BiPAP.  Therefore she is going to keep him on CPAP 8 to 20 cm of water EPR 3.  Please send an order to increase pressure 8 to 20 cm of water with EPR 3

## 2022-07-27 ENCOUNTER — Other Ambulatory Visit: Payer: Self-pay | Admitting: Internal Medicine

## 2022-07-27 DIAGNOSIS — J4520 Mild intermittent asthma, uncomplicated: Secondary | ICD-10-CM

## 2022-07-28 NOTE — Telephone Encounter (Signed)
New, Doristine Mango, RN; Penni Homans; Marveen Reeks; Santina Evans Order received, Thank you!     Previous Messages    ----- Message ----- From: Guy Begin, RN Sent: 07/28/2022  10:22 AM EDT To: Penni Homans; Santina Evans; * Subject: chg pressure to 8-20 and request new cpap ma*  New order in EPIC, pressure changes made in airview (pended)  Pt req new machine  Brian Myers" Male, 77 y.o., Jul 25, 1945 MRN: [id number]  Thank you  Andrey Campanile RN

## 2022-08-16 ENCOUNTER — Telehealth: Payer: Self-pay | Admitting: Adult Health

## 2022-08-16 NOTE — Telephone Encounter (Addendum)
Pt was scheduled for his initial CPAP on (11/07/22) Pt was informed to bring machine and power cord to the appointment.   DME in pt's SnapShot, and between dates are 09/12/22-11/10/22

## 2022-09-07 ENCOUNTER — Ambulatory Visit: Payer: Medicare Other | Admitting: Cardiology

## 2022-09-07 NOTE — Progress Notes (Incomplete)
Electrophysiology Office Follow up Visit Note:    Date:  09/08/2022   ID:  Brian Myers, DOB April 24, 1945, MRN [id number]  PCP:  Myrlene Broker, MD  [facility] Cardiologist:  Lesleigh Noe, MD (Inactive)  [facility] Electrophysiologist:  Lanier Prude, MD    Interval History:    Brian Myers is a 77 y.o. male who presents for a follow up visit.   Last seen by Luster Landsberg on Jul 19, 2022.  The patient was previously followed by Dr. Johney Frame.  The patient has a history of obstructive sleep apnea on CPAP, hyperlipidemia, diabetes, morbid obesity and atrial fibrillation.  His atrial fibrillation has progressively become more symptomatic over the last few years.  When he saw Renee a cardioversion was arranged. His mother-in-law became acutely ill after the cardioversion was arranged and he had to cancel this appointment.  This has not been rescheduled.   Today, he is accompanied by a family member. He states he has been out of rhythm for a long while now, several years. Initially he didn't pursue treatment as he was asymptomatic. Over the past 6 months to a year he began feeling short of breath and fatigued more easily which he attributes to his atrial fibrillation. This has been limiting his activities. He denies ever being on amiodarone in the past. He affirms not missing any doses of his Pradaxa.  Generally he doesn't have issues with LE edema, unless he doesn't elevate his legs or is on them for too long. If he does develop swelling it usually resolves quickly.  Of note, he was going to have left cataract surgery about a week ago, but this was canceled after he was found to have a history of cardioversion. Currently this is rescheduled for the 30th, and he reports that they do not need him to hold his anticoagulation.  He denies any palpitations, chest pain, lightheadedness, headaches, syncope, orthopnea, or PND.     Past medical, surgical, social and family history  were reviewed.  ROS:  Please see the history of present illness. All other systems reviewed and are negative. (+) Shortness of breath (+) Fatigue  EKGs/Labs/Other Studies Reviewed:    The following studies were reviewed today:  Jul 19, 2022 EKG shows rate controlled atrial fibrillation.  QTc while in atrial fibrillation is approximately 480 ms.  EKG Interpretation Date/Time:  Thursday September 08 2022 11:32:09 EDT Ventricular Rate:  68 PR Interval:    QRS Duration:  78 QT Interval:  416 QTC Calculation: 442 R Axis:   75  Text Interpretation: Atrial fibrillation Confirmed by Steffanie Dunn [phone]) on 09/08/2022 12:07:27 PM    Physical Exam:    VS:  BP 130/76   Pulse 74   Ht 5\' 11"  (1.803 m)   Wt 280 lb 12.8 oz (127.4 kg)   SpO2 98%   BMI 39.16 kg/m     Wt Readings from Last 3 Encounters:  09/08/22 280 lb 12.8 oz (127.4 kg)  07/19/22 280 lb (127 kg)  07/15/22 276 lb (125.2 kg)     GEN:  Well nourished, well developed in no acute distress.  Obese CARDIAC: Irregularly irregular, no murmurs, rubs, gallops RESPIRATORY:  Clear to auscultation without rales, wheezing or rhonchi       ASSESSMENT:    1. Persistent atrial fibrillation (HCC)   2. Primary hypertension   3. Morbid obesity (HCC)   4. OSA on CPAP    PLAN:    In order of problems  listed above:  #Persistent atrial fibrillation He has been out of rhythm for a prolonged period of time.  It is unclear how much of his dyspnea and fatigue is related to his atrial fibrillation or whether or not it could be due to deconditioning and morbid obesity.  He is on Pradaxa and has not missed any doses.  I would like to start with a cardioversion to see if he will feel any different while in sinus rhythm.  I recommended starting amiodarone.  He is not a good candidate for dofetilide given a prolonged QTc.  He will start amiodarone 400 mg by mouth twice daily for 5 days followed by 400 mg by mouth daily for 5 days followed by  200 mg by mouth daily thereafter.  I will check a CMP, TSH and free T4 today and plan to repeat those in 8 weeks.  He will have a cardioversion in about 4 weeks.  I will see him back in clinic in about 8 weeks.    I discussed amiodarone in detail including its risks and need for ongoing monitoring and he wishes to proceed.  I discussed the cardioversion procedure in detail with the patient including the risks and he wishes to proceed.    #Hypertension At goal today.  Recommend checking blood pressures 1-2 times per week at home and recording the values.  Recommend bringing these recordings to the primary care physician.  #Obesity Weight loss encouraged  #Obstructive sleep apnea Continued CPAP use encouraged   Follow-up in 8 weeks after cardioversion.   I,Mathew Stumpf,acting as a Neurosurgeon for Lanier Prude, MD.,have documented all relevant documentation on the behalf of Lanier Prude, MD,as directed by  Lanier Prude, MD while in the presence of Lanier Prude, MD.  I, Lanier Prude, MD, have reviewed all documentation for this visit. The documentation on 09/08/22 for the exam, diagnosis, procedures, and orders are all accurate and complete.   Signed, Steffanie Dunn, MD, [facility], [facility] 09/08/2022 12:07 PM    Electrophysiology [facility]

## 2022-09-07 NOTE — Progress Notes (Unsigned)
  Electrophysiology Office Follow up Visit Note:    Date:  09/08/2022   ID:  Brian Myers, DOB 02-23-1946, MRN [id number]  PCP:  Myrlene Broker, MD  [facility] Cardiologist:  Lesleigh Noe, MD (Inactive)  [facility] Electrophysiologist:  Lanier Prude, MD    Interval History:    Brian Myers is a 77 y.o. male who presents for a follow up visit.   Last seen by Luster Landsberg on Jul 19, 2022.  The patient was previously followed by Dr. Johney Frame.  The patient has a history of obstructive sleep apnea on CPAP, hyperlipidemia, diabetes, morbid obesity and atrial fibrillation.  His atrial fibrillation has progressively become more symptomatic over the last few years.  When he saw Renee a cardioversion was arranged. His mother-in-law became acutely ill after the cardioversion was arranged and he had to cancel this appointment.  This has not been rescheduled.        Past medical, surgical, social and family history were reviewed.  ROS:   Please see the history of present illness.    All other systems reviewed and are negative.  EKGs/Labs/Other Studies Reviewed:    The following studies were reviewed today:  Jul 19, 2022 EKG shows rate controlled atrial fibrillation.  QTc while in atrial fibrillation is approximately 480 ms.       Physical Exam:    VS:  There were no vitals taken for this visit.    Wt Readings from Last 3 Encounters:  07/19/22 280 lb (127 kg)  07/15/22 276 lb (125.2 kg)  06/15/22 276 lb (125.2 kg)     GEN: *** Well nourished, well developed in no acute distress.  Obese CARDIAC: ***Irregularly irregular, no murmurs, rubs, gallops RESPIRATORY:  Clear to auscultation without rales, wheezing or rhonchi       ASSESSMENT:    1. Persistent atrial fibrillation (HCC)   2. Primary hypertension   3. Morbid obesity (HCC)   4. OSA on CPAP    PLAN:    In order of problems listed above:   #Persistent atrial fibrillation He has been out of  rhythm for a prolonged period of time.  It is unclear how much of his dyspnea and fatigue is related to his atrial fibrillation or whether or not it could be due to deconditioning and morbid obesity.  He is on Pradaxa and has not missed any doses.  I would like to start with a cardioversion to see if he will feel any different while in sinus rhythm.  If he is able to hold onto normal rhythm, could consider antiarrhythmic drug therapy (Tikosyn versus amiodarone) or catheter ablation.  His weight will complicate the management of his atrial arrhythmias.  Insert hypertension plan  Obesity Weight loss encouraged  #Obstructive sleep apnea Continued CPAP use encouraged   Follow-up in 4 to 6 weeks after cardioversion.   Signed, Steffanie Dunn, MD, [facility], [facility] 09/08/2022 10:50 AM    Electrophysiology [facility]

## 2022-09-08 ENCOUNTER — Ambulatory Visit: Payer: Medicare Other | Attending: Cardiology | Admitting: Cardiology

## 2022-09-08 ENCOUNTER — Encounter: Payer: Self-pay | Admitting: Cardiology

## 2022-09-08 ENCOUNTER — Telehealth: Payer: Self-pay | Admitting: Cardiology

## 2022-09-08 ENCOUNTER — Other Ambulatory Visit: Payer: Self-pay | Admitting: Cardiology

## 2022-09-08 VITALS — BP 130/76 | HR 74 | Ht 71.0 in | Wt 280.8 lb

## 2022-09-08 DIAGNOSIS — I4819 Other persistent atrial fibrillation: Secondary | ICD-10-CM | POA: Insufficient documentation

## 2022-09-08 DIAGNOSIS — G4733 Obstructive sleep apnea (adult) (pediatric): Secondary | ICD-10-CM | POA: Diagnosis not present

## 2022-09-08 DIAGNOSIS — I10 Essential (primary) hypertension: Secondary | ICD-10-CM | POA: Diagnosis not present

## 2022-09-08 MED ORDER — AMIODARONE HCL 200 MG PO TABS: ORAL_TABLET | ORAL | 3 refills | Status: DC

## 2022-09-08 NOTE — Telephone Encounter (Signed)
Took call in triage. [facility] wanted to confirm Dr.Lambert was aware pt was on the medication dabigatran (PRADAXA) 150 mg when he prescribed amiodarone (PACERONE) 200 mg. Per office note from today told Pharmacy Dr. Lalla Brothers had reviewed medications and looked to be aware. Told pharmacy to fill amiodarone as written.  Routing to Dr. Lalla Brothers and his nurse for review.

## 2022-09-08 NOTE — Patient Instructions (Signed)
Medication Instructions:  START Amiodarone 400 mg TWICE daily for 5 days, then decrease to 400 mg ONCE daily for 5 days, then take 200 mg once daily thereafter *If you need a refill on your cardiac medications before your next appointment, please call your pharmacy*   Lab Work: CMET, TSH, and Free T4 TODAY If you have labs (blood work) drawn today and your tests are completely normal, you will receive your results only by: MyChart Message (if you have MyChart) OR A paper copy in the mail If you have any lab test that is abnormal or we need to change your treatment, we will call you to review the results.   Testing/Procedures: Cardioversion Your physician has recommended that you have a Cardioversion (DCCV). Electrical Cardioversion uses a jolt of electricity to your heart either through paddles or wired patches attached to your chest. This is a controlled, usually prescheduled, procedure. Defibrillation is done under light anesthesia in the hospital, and you usually go home the day of the procedure. This is done to get your heart back into a normal rhythm. You are not awake for the procedure. Please see the instruction sheet given to you today.    Follow-Up: At [facility], you and your health needs are our priority.  As part of our continuing mission to provide you with exceptional heart care, we have created designated Provider Care Teams.  These Care Teams include your primary Cardiologist (physician) and Advanced Practice Providers (APPs -  Physician Assistants and Nurse Practitioners) who all work together to provide you with the care you need, when you need it.  We recommend signing up for the patient portal called "MyChart".  Sign up information is provided on this After Visit Summary.  MyChart is used to connect with patients for Virtual Visits (Telemedicine).  Patients are able to view lab/test results, encounter notes, upcoming appointments, etc.  Non-urgent messages can be  sent to your provider as well.   To learn more about what you can do with MyChart, go to [email or web address].    Your next appointment:   8 weeks  Provider:   Steffanie Dunn, MD

## 2022-09-08 NOTE — Telephone Encounter (Signed)
Macy from [facility] called to report a drug interaction between two of the medications the patient is taking;  amiodarone (PACERONE) 200 MG tablet and dabigatran (PRADAXA) 150 MG CAPS capsule

## 2022-09-08 NOTE — Telephone Encounter (Signed)
Pharmacy is stating that there is a DDI with Pradaxa increased bleeding. Is Dr. Lalla Brothers aware and does Dr. Lalla Brothers want to continue to refill the medication amiodarone? Please address

## 2022-09-09 LAB — CBC
Hematocrit: 36.4 % — ABNORMAL LOW (ref 37.5–51.0)
Hemoglobin: 11.2 g/dL — ABNORMAL LOW (ref 13.0–17.7)
MCH: 24.3 pg — ABNORMAL LOW (ref 26.6–33.0)
MCHC: 30.8 g/dL — ABNORMAL LOW (ref 31.5–35.7)
MCV: 79 fL (ref 79–97)
Platelets: 278 10*3/uL (ref 150–450)
RBC: 4.61 x10E6/uL (ref 4.14–5.80)
RDW: 17.1 % — ABNORMAL HIGH (ref 11.6–15.4)
WBC: 7.7 10*3/uL (ref 3.4–10.8)

## 2022-09-09 LAB — COMPREHENSIVE METABOLIC PANEL
ALT: 16 IU/L (ref 0–44)
AST: 19 IU/L (ref 0–40)
Albumin: 4.2 g/dL (ref 3.8–4.8)
Alkaline Phosphatase: 62 IU/L (ref 44–121)
BUN/Creatinine Ratio: 15 (ref 10–24)
BUN: 13 mg/dL (ref 8–27)
Bilirubin Total: 0.4 mg/dL (ref 0.0–1.2)
CO2: 27 mmol/L (ref 20–29)
Calcium: 9.5 mg/dL (ref 8.6–10.2)
Chloride: 99 mmol/L (ref 96–106)
Creatinine, Ser: 0.88 mg/dL (ref 0.76–1.27)
Globulin, Total: 2.8 g/dL (ref 1.5–4.5)
Glucose: 99 mg/dL (ref 70–99)
Potassium: 4.2 mmol/L (ref 3.5–5.2)
Sodium: 138 mmol/L (ref 134–144)
Total Protein: 7 g/dL (ref 6.0–8.5)
eGFR: 90 mL/min/{1.73_m2} (ref >59)

## 2022-09-09 LAB — TSH: TSH: 3.9 u[IU]/mL (ref 0.450–4.500)

## 2022-09-09 LAB — T4, FREE: Free T4: 0.98 ng/dL (ref 0.82–1.77)

## 2022-09-09 NOTE — Telephone Encounter (Signed)
Spoke with Dr. Lalla Brothers and PharmD.  Pradaxa and amiodarone are not contraindicated but increase concentration of Pradaxa . Will make sure that patient is not having any bleeding issues. If no concerns he is okay to continue on both medications and monitor for any sings/symptoms of bleeding.  Spoke with the patient who reports no current bleeding issues. Advised he is okay to continue with Pradaxa and amiodarone.

## 2022-09-12 ENCOUNTER — Encounter: Payer: Self-pay | Admitting: Adult Health

## 2022-09-12 DIAGNOSIS — G4733 Obstructive sleep apnea (adult) (pediatric): Secondary | ICD-10-CM

## 2022-09-12 NOTE — Telephone Encounter (Addendum)
Ramp turned off in [facility]. Order sent to [facility].

## 2022-09-13 NOTE — Telephone Encounter (Signed)
[facility] confirmed receipt of order.  

## 2022-09-19 ENCOUNTER — Telehealth: Payer: Self-pay | Admitting: *Deleted

## 2022-09-19 ENCOUNTER — Encounter: Payer: Self-pay | Admitting: Cardiology

## 2022-09-19 NOTE — Telephone Encounter (Signed)
   Pre-operative Risk Assessment    Patient Name: Brian Myers  DOB: [birth year] MRN: [id number]      Request for Surgical Clearance    Procedure:   CATARACT SURGERY  Date of Surgery:  Clearance 09/27/22                                 Surgeon:  DR. Hazle Quant Surgeon's Group or Practice Name:  [facility] Phone number:  [phone] Fax number:  [phone]   Type of Clearance Requested:   - Medical ; PER CLEARANCE FORM PT DOES NOT NEED TO HOLD ANY BLOOD THINNERS    Type of Anesthesia:  Not Indicated   Additional requests/questions:    Elpidio Anis   09/19/2022, 4:21 PM

## 2022-09-19 NOTE — Telephone Encounter (Signed)
   Patient Name: AVA DEGUIRE  DOB: 10-Jan-1946 MRN: [id number]  Primary Cardiologist: Lesleigh Noe, MD (Inactive)  Chart reviewed as part of pre-operative protocol coverage. Cataract extractions are recognized in guidelines as low risk surgeries that do not typically require specific preoperative testing or holding of blood thinner therapy. Therefore, given past medical history and time since last visit, based on ACC/AHA guidelines, GORGE ALMANZA would be at acceptable risk for the planned procedure without further cardiovascular testing.   I will route this recommendation to the requesting party via Epic fax function and remove from pre-op pool.  Please call with questions.  Joylene Grapes, NP 09/19/2022, 4:47 PM

## 2022-09-27 DIAGNOSIS — H268 Other specified cataract: Secondary | ICD-10-CM | POA: Diagnosis not present

## 2022-09-27 DIAGNOSIS — H25812 Combined forms of age-related cataract, left eye: Secondary | ICD-10-CM | POA: Diagnosis not present

## 2022-10-05 ENCOUNTER — Encounter: Payer: Self-pay | Admitting: Cardiology

## 2022-10-07 ENCOUNTER — Ambulatory Visit (HOSPITAL_COMMUNITY): Admission: RE | Admit: 2022-10-07 | Payer: Medicare Other | Source: Ambulatory Visit | Admitting: Internal Medicine

## 2022-10-07 ENCOUNTER — Encounter (HOSPITAL_COMMUNITY): Admission: RE | Payer: Self-pay | Source: Ambulatory Visit

## 2022-10-07 ENCOUNTER — Encounter: Payer: Self-pay | Admitting: Internal Medicine

## 2022-10-07 DIAGNOSIS — I4819 Other persistent atrial fibrillation: Secondary | ICD-10-CM

## 2022-10-07 SURGERY — CARDIOVERSION
Anesthesia: General

## 2022-10-10 ENCOUNTER — Encounter: Payer: Self-pay | Admitting: Internal Medicine

## 2022-10-10 ENCOUNTER — Ambulatory Visit (INDEPENDENT_AMBULATORY_CARE_PROVIDER_SITE_OTHER): Payer: Medicare Other | Admitting: Internal Medicine

## 2022-10-10 VITALS — BP 124/82 | HR 85 | Temp 98.2°F | Ht 71.0 in | Wt 283.0 lb

## 2022-10-10 DIAGNOSIS — E118 Type 2 diabetes mellitus with unspecified complications: Secondary | ICD-10-CM | POA: Diagnosis not present

## 2022-10-10 DIAGNOSIS — R051 Acute cough: Secondary | ICD-10-CM

## 2022-10-10 DIAGNOSIS — R062 Wheezing: Secondary | ICD-10-CM

## 2022-10-10 DIAGNOSIS — R059 Cough, unspecified: Secondary | ICD-10-CM | POA: Insufficient documentation

## 2022-10-10 DIAGNOSIS — E559 Vitamin D deficiency, unspecified: Secondary | ICD-10-CM

## 2022-10-10 MED ORDER — LEVOFLOXACIN 500 MG PO TABS: 500.0000 mg | ORAL_TABLET | Freq: Every day | ORAL | 0 refills | Status: AC

## 2022-10-10 MED ORDER — METHYLPREDNISOLONE ACETATE 80 MG/ML IJ SUSP
80.0000 mg | Freq: Once | INTRAMUSCULAR | Status: AC
Start: 2022-10-10 — End: 2022-10-10
  Administered 2022-10-10: 80 mg via INTRAMUSCULAR

## 2022-10-10 MED ORDER — HYDROCODONE BIT-HOMATROP MBR 5-1.5 MG/5ML PO SOLN: 5.0000 mL | Freq: Four times a day (QID) | ORAL | 0 refills | Status: AC | PRN

## 2022-10-10 MED ORDER — PREDNISONE 10 MG PO TABS: ORAL_TABLET | ORAL | 0 refills | Status: DC

## 2022-10-10 NOTE — Patient Instructions (Addendum)
You had the steroid shot today  Please take all new medication as prescribed  - the antibiotic, cough medicine, and prednisone  Please continue all other medications as before, including the albuterol  Please have the pharmacy call with any other refills you may need.  Please keep your appointments with your specialists as you may have planned

## 2022-10-10 NOTE — Progress Notes (Unsigned)
Patient ID: Brian Myers, male   DOB: [birth year], 77 y.o.   MRN: [id number]        Chief Complaint: follow up cough, wheezing, dm, low vit d       HPI:  Brian Myers is a 77 y.o. male Here with acute onset mild to mod 6 days ST, HA, general weakness and malaise, with prod cough greenish sputum, but Pt denies chest pain, increased sob or doe, wheezing, orthopnea, PND, increased LE swelling, palpitations, dizziness or syncope except for wheezing sob in the last 2 days.   Pt denies polydipsia, polyuria, or new focal neuro s/s.    Pt denies recent wt loss, night sweats, loss of appetite, or other constitutional symptoms    Wt Readings from Last 3 Encounters:  10/10/22 283 lb (128.4 kg)  09/08/22 280 lb 12.8 oz (127.4 kg)  07/19/22 280 lb (127 kg)   BP Readings from Last 3 Encounters:  10/10/22 124/82  09/08/22 130/76  07/19/22 134/74         Past Medical History:  Diagnosis Date   Arthritis    KNEES   Asthma    Bladder problem    enlarged bladder   BPH (benign prostatic hypertrophy)    DIFFICULTY GETTING STREAM STARTED   Eczema    HANDS   GERD (gastroesophageal reflux disease)    H/O hiatal hernia    Hyperlipidemia    Hyperlipidemia    DOES NOT TOLERATE ANY OF THE STATINS   Persistent atrial fibrillation (HCC)    Dr Katrinka Blazing   Pneumonia    LAST EPISODE JAN 2015   Right knee meniscal tear    PAIN RT KNEE   Sleep apnea    with cpap   Past Surgical History:  Procedure Laterality Date   CARDIOVERSION N/A 06/10/2020   Procedure: CARDIOVERSION;  Surgeon: Parke Poisson, MD;  Location[facility] ENDOSCOPY;  Service: Cardiovascular;  Laterality: N/A;   CERVICAL FUSION  1991   KNEE ARTHROSCOPY Right 04/17/2013   Procedure: ARTHROSCOPY RIGHT KNEE WITH MEDIAL AND LATERAL DEBRIDEMENT AND CONDRAPLASTY;  Surgeon: Loanne Drilling, MD;  Location: [facility] ORS;  Service: Orthopedics;  Laterality: Right;   LEFT KNEE ARTHROSCPY  2009   TEE WITHOUT CARDIOVERSION N/A 06/10/2020   Procedure:  TRANSESOPHAGEAL ECHOCARDIOGRAM (TEE);  Surgeon: Parke Poisson, MD;  Location[facility] ENDOSCOPY;  Service: Cardiovascular;  Laterality: N/A;    reports that he has never smoked. He has never used smokeless tobacco. He reports current alcohol use of about 28.0 standard drinks of alcohol per week. He reports that he does not use drugs. family history includes Arrhythmia in his mother; Hypertension in his father; Mitral valve prolapse in his mother. Allergies  Allergen Reactions   Penicillins Anaphylaxis   Sulfa Drugs Cross Reactors Anaphylaxis   Statins Other (See Comments)    INTOLERANT TO STATINS - SEVERE MUSCLE CRAMPS   Omeprazole Other (See Comments)    Unknown   Current Outpatient Medications on File Prior to Visit  Medication Sig Dispense Refill   albuterol (VENTOLIN HFA) 108 (90 Base) MCG/ACT inhaler Inhale 1 puff into the lungs every 4 (four) hours as needed for wheezing or shortness of breath. 6.7 g 2   amiodarone (PACERONE) 200 MG tablet Take 400 mg twice daily for 5 days, then decrease to 400 mg once daily for 5 days, then decrease to 200 mg once daily thereafter 90 tablet 3   brimonidine (ALPHAGAN) 0.2 % ophthalmic solution Place 1 drop into the left  eye 2 (two) times daily.     cyanocobalamin (VITAMIN B12) 1000 MCG tablet Take 1,000 mcg by mouth in the morning.     dabigatran (PRADAXA) 150 MG CAPS capsule Take 1 capsule (150 mg total) by mouth every 12 (twelve) hours. Overdue for follow-up, MUST see MD for FUTURE refills. 180 capsule 1   diltiazem (CARDIZEM CD) 360 MG 24 hr capsule Take 360 mg by mouth every evening.     esomeprazole (NEXIUM) 20 MG capsule Take 20 mg by mouth 2 (two) times daily.  1   Ferrous Sulfate (IRON PO) Take 45 mg by mouth daily.     ferrous sulfate 325 (65 FE) MG EC tablet Take 325 mg by mouth every other day.     finasteride (PROSCAR) 5 MG tablet Take 5 mg by mouth in the morning.     fluticasone (FLONASE) 50 MCG/ACT nasal spray Place 1 spray into both  nostrils 2 (two) times a day.     losartan (COZAAR) 50 MG tablet Take 1 tablet (50 mg total) by mouth daily. (Patient taking differently: Take 50 mg by mouth every evening.) 90 tablet 1   methylcellulose (ARTIFICIAL TEARS) 1 % ophthalmic solution Place 1 drop into both eyes in the morning and at bedtime.     PRESCRIPTION MEDICATION Place 1 drop into the left eye in the morning, at noon, in the evening, and at bedtime. Compunded  @ [facility] (mail order) Prednisolone, moxifloxacin, bromfenac Taper     SYMBICORT 160-4.5 MCG/ACT inhaler TAKE 2 PUFFS BY MOUTH TWICE A DAY 30.6 each 1   Vitamin D, Ergocalciferol, (DRISDOL) 1.25 MG (50000 UNIT) CAPS capsule Take 1 capsule (50,000 Units total) by mouth every 7 (seven) days. (Patient taking differently: Take 5,000 Units by mouth every Thursday.) 12 capsule 0   No current facility-administered medications on file prior to visit.        ROS:  All others reviewed and negative.  Objective        PE:  BP 124/82 (BP Location: Left Arm, Patient Position: Sitting, Cuff Size: Normal)   Pulse 85   Temp 98.2 F (36.8 C) (Oral)   Ht 5\' 11"  (1.803 m)   Wt 283 lb (128.4 kg)   SpO2 97%   BMI 39.47 kg/m                 Constitutional: Pt appears in NAD               HENT: Head: NCAT.                Right Ear: External ear normal.                 Left Ear: External ear normal.                Eyes: . Pupils are equal, round, and reactive to light. Conjunctivae and EOM are normal               Nose: without d/c or deformity               Neck: Neck supple. Gross normal ROM               Cardiovascular: Normal rate and regular rhythm.                 Pulmonary/Chest: Effort normal and breath sounds without rales or wheezing.                Abd:  Soft, NT, ND, + BS, no organomegaly               Neurological: Pt is alert. At baseline orientation, motor grossly intact               Skin: Skin is warm. No rashes, no other new lesions, LE edema - none                Psychiatric: Pt behavior is normal without agitation   Micro: none  Cardiac tracings I have personally interpreted today:  none  Pertinent Radiological findings (summarize): none   Lab Results  Component Value Date   WBC 7.7 09/08/2022   HGB 11.2 (L) 09/08/2022   HCT 36.4 (L) 09/08/2022   PLT 278 09/08/2022   GLUCOSE 99 09/08/2022   CHOL 217 (H) 01/06/2022   TRIG 85.0 01/06/2022   HDL 50.50 01/06/2022   LDLCALC 150 (H) 01/06/2022   ALT 16 09/08/2022   AST 19 09/08/2022   NA 138 09/08/2022   K 4.2 09/08/2022   CL 99 09/08/2022   CREATININE 0.88 09/08/2022   BUN 13 09/08/2022   CO2 27 09/08/2022   TSH 3.900 09/08/2022   PSA 14.79 (H) 01/04/2021   INR 1.04 04/12/2013   HGBA1C 6.0 07/15/2022   MICROALBUR 0.7 01/06/2022   Assessment/Plan:  Brian Myers is a 77 y.o. White or Caucasian [1] male with  has a past medical history of Arthritis, Asthma, Bladder problem, BPH (benign prostatic hypertrophy), Eczema, GERD (gastroesophageal reflux disease), H/O hiatal hernia, Hyperlipidemia, Hyperlipidemia, Persistent atrial fibrillation (HCC), Pneumonia, Right knee meniscal tear, and Sleep apnea.  Cough Mild to mod, for antibx course levaquin 500 every day, cough med prn, decliens cxr to r/o pna,,  to f/u any worsening symptoms or concerns  Wheezing Mild to mod, depomedrol 80 mg IM, prednisone taper,  cont inhaler prn, to f/u any worsening symptoms or concerns  Type 2 diabetes with complication (HCC) Lab Results  Component Value Date   HGBA1C 6.0 07/15/2022   Stable, pt to continue current medical treatment  - diet, wt control   Vitamin D deficiency Last vitamin D Lab Results  Component Value Date   VD25OH 48.41 01/06/2022   Stable, cont oral replacement  Followup: Return if symptoms worsen or fail to improve.  Oliver Barre, MD 10/11/2022 9:01 PM [facility]

## 2022-10-11 ENCOUNTER — Encounter: Payer: Self-pay | Admitting: Internal Medicine

## 2022-10-11 NOTE — Assessment & Plan Note (Signed)
Mild to mod, for antibx course levaquin 500 every day, cough med prn, decliens cxr to r/o pna,,  to f/u any worsening symptoms or concerns

## 2022-10-11 NOTE — Assessment & Plan Note (Signed)
Lab Results  Component Value Date   HGBA1C 6.0 07/15/2022   Stable, pt to continue current medical treatment  - diet,wt control

## 2022-10-11 NOTE — Assessment & Plan Note (Addendum)
Mild to mod, depomedrol 80 mg IM, prednisone taper,  cont inhaler prn, to f/u any worsening symptoms or concerns

## 2022-10-11 NOTE — Assessment & Plan Note (Signed)
Last vitamin D Lab Results  Component Value Date   VD25OH 48.41 01/06/2022   Stable, cont oral replacement

## 2022-11-02 NOTE — Telephone Encounter (Signed)
Spoke with the patient and scheduled him an appointment to update EKG and H&P and reschedule cardioversion

## 2022-11-07 ENCOUNTER — Ambulatory Visit (INDEPENDENT_AMBULATORY_CARE_PROVIDER_SITE_OTHER): Payer: Medicare Other | Admitting: Neurology

## 2022-11-07 ENCOUNTER — Encounter: Payer: Self-pay | Admitting: Neurology

## 2022-11-07 VITALS — BP 131/76 | HR 72 | Ht 71.0 in | Wt 280.0 lb

## 2022-11-07 DIAGNOSIS — E538 Deficiency of other specified B group vitamins: Secondary | ICD-10-CM

## 2022-11-07 DIAGNOSIS — J4520 Mild intermittent asthma, uncomplicated: Secondary | ICD-10-CM | POA: Diagnosis not present

## 2022-11-07 DIAGNOSIS — G4700 Insomnia, unspecified: Secondary | ICD-10-CM

## 2022-11-07 DIAGNOSIS — G4733 Obstructive sleep apnea (adult) (pediatric): Secondary | ICD-10-CM | POA: Diagnosis not present

## 2022-11-07 DIAGNOSIS — I4819 Other persistent atrial fibrillation: Secondary | ICD-10-CM | POA: Diagnosis not present

## 2022-11-07 DIAGNOSIS — G320 Subacute combined degeneration of spinal cord in diseases classified elsewhere: Secondary | ICD-10-CM

## 2022-11-07 NOTE — Addendum Note (Signed)
Addended by: Melvyn Novas on: 11/07/2022 03:31 PM   Modules accepted: Orders

## 2022-11-07 NOTE — Progress Notes (Addendum)
SLEEP MEDICINE CLINIC    Provider:  Melvyn Novas, MD  Primary Care Physician:  Myrlene Broker, MD [address] [place] [address]     Referring Provider: Myrlene Broker, Md [address] [place],  [place] [address]          Chief Complaint according to patient   Patient presents with:     New CPAP settings for OSA on CPAP, atrial fib. Atrial fib on Amiodarone , awaiting  cardioversion, not ablation.  Sleep apnea , hypopnea , not fully reponsive to CPAP or BiPAP and  may be  needing an ASV titration. I like for him to undergo PAP titration beginning at 13/ 9 cm water and to stop at 20/ 16 cm water , try ST and try to add oxygen as needed, then advance to ASV if centrals are emerging.  This current setting is at CPAP of 8 through 20 cm of water with 3 cm EPR,  humidifier, and  AirTouch fullface mask large size.             HISTORY OF PRESENT ILLNESS:  Brian Myers is a 77 y.o. male patient who is here for revisit 11/07/2022 for  follow up on PAP titration in lab. He reports reduced lung capacity, on inhalers. Has severe apnea which did not respond well to CPAP or BiPAP in lab. Atrial fibrillation is chronic.  He was started on amiodarone and he is coughing a lot recently he has been scheduled for a cardioversion.  He feels very fatigued.   07-17-2022:  The patient was first started during the night in the sleep lab at 8 cm water pressure CPAP using a fullface mask the so-called AirTouch F20.  CPAP was further increased without a significant reduction in apnea and hypopnea.  So BiPAP was then explored beginning at 20 over 15 cm water and was increased to 26/21 number reduce the AHI to the single digits.  The patient experienced no central apneas and he slept nonsupine.  Most of his respiratory events remained to be hypopneas not frank apneas.  The total sleep time was reduced and a quality of sleep seems not to have changed his positive airway  pressure.  As the patient felt at home that he had fared well under auto titration CPAP I, for now,  just reset his new machine for a higher pressure.   This is 8 through 20 cm of water with 3 cm EPR,  humidifier, and  AirTouch fullface mask large size.  I do have to add that I hope for the patient that he can repeat the benefit of positive airway pressure after his cardioversion.  I do think that his ongoing atrial fibrillation is affecting the residual apnea-hypopnea index even that it does not capture central apneas here.  So once he had his cardioversion I will like for him to have a sleep study to a ASV.   07-17-2022:  This 77 year-old Male patient is a long term CPAP user and feels well when using PAP therapy, He has atrial fibrillation, causing SOB. 06/15/22: [name], NP, Current CPAP auto set between 8 an 18 cm water , 95% pressure at 15 cm water , AHI 11.3/h  no air leak.   He is in urgent need for a new machine.  The Epworth Sleepiness Scale was 0 out of 24 (scores above or equal to 10 are suggestive of hypersomnolence).  Based on CMS criteria (  using a 4% oxygen desaturation rule for scoring hypopneas), there were 21 apneas (19 obstructive; 0 central; 2 mixed), and 191 hypopneas.  Apnea index was 5.2. Hypopnea index was 47.7. The apnea-hypopnea index was 52.9 overall (61.1 supine, 60.0 non-supine; 60.0 REM, 0.0 supine REM). There were 0 respiratory effort-related arousals (RERAs).   OXIMETRY: Total sleep time spent at, or below 88% was 94.0 minutes, or 39.1% of total sleep time. Respiratory events were associated with oxyhemoglobin desaturations (nadir during sleep 70% from a mean of 90%.  CARDIAC: The electrocardiogram documented arrythmia.      Review of Systems: Out of a complete 14 system review, the patient complains of only the following symptoms, and all other reviewed systems are negative.:  Fatigue +++, sleepiness , snoring, fragmented sleep, atrial fib.    How likely are  you to doze in the following situations: 0 = not likely, 1 = slight chance, 2 = moderate chance, 3 = high chance   Sitting and Reading? Watching Television? Sitting inactive in a public place (theater or meeting)? As a passenger in a car for an hour without a break? Lying down in the afternoon when circumstances permit? Sitting and talking to someone? Sitting quietly after lunch without alcohol? In a car, while stopped for a few minutes in traffic?   His Epworth Sleepiness Scale is endorsed at 4 and his fatigue severity scale endorsed at 45 points and he endorsed 0 points on the geriatric depression score.  Social History   Socioeconomic History   Marital status: Married    Spouse name: Not on file   Number of children: 2   Years of education: Not on file   Highest education level: Bachelor's degree (e.g., BA, AB, BS)  Occupational History    Employer: [employer]  Tobacco Use   Smoking status: Never   Smokeless tobacco: Never  Vaping Use   Vaping status: Never Used  Substance and Sexual Activity   Alcohol use: Yes    Alcohol/week: 28.0 standard drinks of alcohol    Types: 28 Glasses of wine per week    Comment: 4 glasses of wine per day   Drug use: No   Sexual activity: Not on file  Other Topics Concern   Not on file  Social History Narrative   Lives in [place] with spouse.  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   Right handed   No caffiene   Social Determinants of Health   Financial Resource Strain: Low Risk  (07/15/2022)   Overall Financial Resource Strain (CARDIA)    Difficulty of Paying Living Expenses: Not hard at all  Food Insecurity: No Food Insecurity (07/15/2022)   Hunger Vital Sign    Worried About Running Out of Food in the Last Year: Never true    Ran Out of Food in the Last Year: Never true  Transportation Needs: No Transportation Needs (07/15/2022)   PRAPARE - Administrator, Civil Service (Medical): No    Lack of  Transportation (Non-Medical): No  Physical Activity: Unknown (07/15/2022)   Exercise Vital Sign    Days of Exercise per Week: 0 days    Minutes of Exercise per Session: Not on file  Stress: No Stress Concern Present (07/15/2022)   [employer] of Occupational Health - Occupational Stress Questionnaire    Feeling of Stress : Not at all  Social Connections: Moderately Isolated (07/15/2022)   Social Connection and Isolation Panel [NHANES]    Frequency of Communication with  Friends and Family: Twice a week    Frequency of Social Gatherings with Friends and Family: Once a week    Attends Religious Services: Never    Database administrator or Organizations: No    Attends Engineer, structural: Not on file    Marital Status: Married    Family History  Problem Relation Age of Onset   Arrhythmia Mother        atrial fib   Mitral valve prolapse Mother    Hypertension Father     Past Medical History:  Diagnosis Date   Arthritis    KNEES   Asthma    Bladder problem    enlarged bladder   BPH (benign prostatic hypertrophy)    DIFFICULTY GETTING STREAM STARTED   Eczema    HANDS   GERD (gastroesophageal reflux disease)    H/O hiatal hernia    Hyperlipidemia    Hyperlipidemia    DOES NOT TOLERATE ANY OF THE STATINS   Persistent atrial fibrillation (HCC)    Dr Katrinka Blazing   Pneumonia    LAST EPISODE JAN 2015   Right knee meniscal tear    PAIN RT KNEE   Sleep apnea    with cpap    Past Surgical History:  Procedure Laterality Date   CARDIOVERSION N/A 06/10/2020   Procedure: CARDIOVERSION;  Surgeon: Parke Poisson, MD;  Location: [facility] ENDOSCOPY;  Service: Cardiovascular;  Laterality: N/A;   CERVICAL FUSION  1991   KNEE ARTHROSCOPY Right 04/17/2013   Procedure: ARTHROSCOPY RIGHT KNEE WITH MEDIAL AND LATERAL DEBRIDEMENT AND CONDRAPLASTY;  Surgeon: Loanne Drilling, MD;  Location: [facility] ORS;  Service: Orthopedics;  Laterality: Right;   LEFT KNEE ARTHROSCPY  2009   TEE WITHOUT  CARDIOVERSION N/A 06/10/2020   Procedure: TRANSESOPHAGEAL ECHOCARDIOGRAM (TEE);  Surgeon: Parke Poisson, MD;  Location: [facility] ENDOSCOPY;  Service: Cardiovascular;  Laterality: N/A;     Current Outpatient Medications on File Prior to Visit  Medication Sig Dispense Refill   albuterol (VENTOLIN HFA) 108 (90 Base) MCG/ACT inhaler Inhale 1 puff into the lungs every 4 (four) hours as needed for wheezing or shortness of breath. 6.7 g 2   amiodarone (PACERONE) 200 MG tablet Take 400 mg twice daily for 5 days, then decrease to 400 mg once daily for 5 days, then decrease to 200 mg once daily thereafter 90 tablet 3   brimonidine (ALPHAGAN) 0.2 % ophthalmic solution Place 1 drop into the left eye 2 (two) times daily.     cyanocobalamin (VITAMIN B12) 1000 MCG tablet Take 1,000 mcg by mouth in the morning.     dabigatran (PRADAXA) 150 MG CAPS capsule Take 1 capsule (150 mg total) by mouth every 12 (twelve) hours. Overdue for follow-up, MUST see MD for FUTURE refills. 180 capsule 1   diltiazem (CARDIZEM CD) 360 MG 24 hr capsule Take 360 mg by mouth every evening.     esomeprazole (NEXIUM) 20 MG capsule Take 20 mg by mouth 2 (two) times daily.  1   Ferrous Sulfate (IRON PO) Take 45 mg by mouth daily.     ferrous sulfate 325 (65 FE) MG EC tablet Take 325 mg by mouth every other day.     finasteride (PROSCAR) 5 MG tablet Take 5 mg by mouth in the morning.     fluticasone (FLONASE) 50 MCG/ACT nasal spray Place 1 spray into both nostrils 2 (two) times a day.     losartan (COZAAR) 50 MG tablet Take 1 tablet (50 mg  total) by mouth daily. (Patient taking differently: Take 50 mg by mouth every evening.) 90 tablet 1   methylcellulose (ARTIFICIAL TEARS) 1 % ophthalmic solution Place 1 drop into both eyes in the morning and at bedtime.     predniSONE (DELTASONE) 10 MG tablet 3 tabs by mouth per day for 3 days,2tabs per day for 3 days,1tab per day for 3 days 18 tablet 0   PRESCRIPTION MEDICATION Place 1 drop into the left  eye in the morning, at noon, in the evening, and at bedtime. Compunded  @ [facility] (mail order) Prednisolone, moxifloxacin, bromfenac Taper     SYMBICORT 160-4.5 MCG/ACT inhaler TAKE 2 PUFFS BY MOUTH TWICE A DAY 30.6 each 1   Vitamin D, Ergocalciferol, (DRISDOL) 1.25 MG (50000 UNIT) CAPS capsule Take 1 capsule (50,000 Units total) by mouth every 7 (seven) days. (Patient taking differently: Take 5,000 Units by mouth every Thursday.) 12 capsule 0   No current facility-administered medications on file prior to visit.    Allergies  Allergen Reactions   Penicillins Anaphylaxis   Sulfa Drugs Cross Reactors Anaphylaxis   Statins Other (See Comments)    INTOLERANT TO STATINS - SEVERE MUSCLE CRAMPS   Omeprazole Other (See Comments)    Unknown     DIAGNOSTIC DATA (LABS, IMAGING, TESTING) - I reviewed patient records, labs, notes, testing and imaging myself where available.  Lab Results  Component Value Date   WBC 7.7 09/08/2022   HGB 11.2 (L) 09/08/2022   HCT 36.4 (L) 09/08/2022   MCV 79 09/08/2022   PLT 278 09/08/2022      Component Value Date/Time   NA 138 09/08/2022 1140   K 4.2 09/08/2022 1140   CL 99 09/08/2022 1140   CO2 27 09/08/2022 1140   GLUCOSE 99 09/08/2022 1140   GLUCOSE 98 01/06/2022 1128   BUN 13 09/08/2022 1140   CREATININE 0.88 09/08/2022 1140   CREATININE 0.98 11/27/2015 1442   CALCIUM 9.5 09/08/2022 1140   PROT 7.0 09/08/2022 1140   ALBUMIN 4.2 09/08/2022 1140   AST 19 09/08/2022 1140   ALT 16 09/08/2022 1140   ALKPHOS 62 09/08/2022 1140   BILITOT 0.4 09/08/2022 1140   GFRNONAA 85 (L) 04/12/2013 1500   GFRAA >90 04/12/2013 1500   Lab Results  Component Value Date   CHOL 217 (H) 01/06/2022   HDL 50.50 01/06/2022   LDLCALC 150 (H) 01/06/2022   TRIG 85.0 01/06/2022   CHOLHDL 4 01/06/2022   Lab Results  Component Value Date   HGBA1C 6.0 07/15/2022   Lab Results  Component Value Date   VITAMINB12 312 01/06/2022   Lab Results  Component Value Date    TSH 3.900 09/08/2022    PHYSICAL EXAM:  Today's Vitals   11/07/22 1420  BP: 131/76  Pulse: 72  Weight: 280 lb (127 kg)  Height: 5\' 11"  (1.803 m)   Body mass index is 39.05 kg/m.   Wt Readings from Last 3 Encounters:  11/07/22 280 lb (127 kg)  10/10/22 283 lb (128.4 kg)  09/08/22 280 lb 12.8 oz (127.4 kg)     Ht Readings from Last 3 Encounters:  11/07/22 5\' 11"  (1.803 m)  10/10/22 5\' 11"  (1.803 m)  09/08/22 5\' 11"  (1.803 m)      General:The patient is awake, alert and appears not in acute distress. The patient is well groomed. Head: Normocephalic, atraumatic. Neck is supple. Mallampati  2 neck circumference: 19 . Nasal airflow patent  . Prognathia is seen.  Patient with  a fib, currently in NSR.    Facial rash - rosacea? Easy bruising.  Neurologic exam : The patient is awake and alert, oriented to place and time.   Memory subjective  described as intact.  Attention span & concentration ability appears normal. Speech is fluent, without dysarthria, dysphonia or aphasia.    Cranial nerves: Pupils are equal and briskly reactive to light.  Facial sensation intact to fine touch. Facial motor strength is symmetric and tongue and uvula move midline.  Shoulder shrug was symmetrical.    Deep tendon reflexes: in the upper and lower extremities are attenuated.    ASSESSMENT AND PLAN 77 y.o. year old male  here with:  New settings on CPAP with still high residual AHI.  The patient has been 100% compliance with CPAP use he has shown to use it 0.8 hours 9 minutes on average each night.  Currently setting between an 8 cm water pressure and through a 20 cm maximum setting with 3 cm expiratory relief.  His residual AHI is 15.3 and of these residual events 9.3/h hypopneas or shallow breathing events, and 6.0 apneas.  Of the apneas 1.1 are central and 4.7 obstructive.  95th percentile pressure is 18.7 cm water so very high at the air leakage is actually moderate also the patient feels that  it has increased since the reset.   Atrial fib on Amiodarone , awaiting  cardioversion, not ablation.  Sleep apnea , hypopnea , not fully reponsive to CPAP or BiPAP and  may be  needing an ASV titration. I like for him to undergo PAP titration beginning at 13/ 9 cm water and to stop at 20/ 16 cm water , try ST and try to add oxygen as needed, then advance to ASV if centrals are emerging.  This current setting is at CPAP of 8 through 20 cm of water with 3 cm EPR,  humidifier, and  AirTouch fullface mask large size.    I do have to add that I hope for the patient that he can repeat the benefit of positive airway pressure after his cardioversion.  I do think that his ongoing atrial fibrillation is affecting the residual apnea-hypopnea index even that it does not capture central apneas here.  So once he had his cardioversion I will like for him to have a sleep study titration again with option of 02 and PAP.  He needs to reduce alcohol intake- this can help to reduce OSA and atrial fib.   I plan to follow up through our NP within 6-8 months.   I would like to thank  Myrlene Broker, Md [address] [place],  [place] [address] for allowing me to meet with and to take care of this pleasant patient.   CC: I will share my notes with cardiologist Dr. Lalla Brothers    After spending a total time of  35  minutes face to face and additional time for physical and neurologic examination, review of laboratory studies,  personal review of imaging studies, reports and results of other testing and review of referral information / records as far as provided in visit,   Electronically signed by: Melvyn Novas, MD 11/07/2022 3:06 PM  [facility] Board certified by The [employer] of Sleep Medicine and Diplomate of the [employer] of Sleep Medicine. Board certified In Neurology through the ABPN, Fellow of the [employer] of Neurology.

## 2022-11-08 NOTE — Progress Notes (Signed)
Cardiology Office Note:    Date:  11/09/2022  ID:  Brian Myers, DOB Feb 18, 1946, MRN [id number] PCP: Myrlene Broker, MD  [facility] Providers Cardiologist:  Lesleigh Noe, MD (Inactive) Electrophysiologist:  Lanier Prude, MD       Patient Profile:      Persistent atrial fibrillation  TTE 07/04/22: EF 60-65, no RWMA, mild asymmetric LVH, NL RVSF, mild BAE, trivial MR, AV sclerosis, Ao root 41 mm Monitor 06/2022: AFib, Avg HR 82 Dilated ascending aorta TEE 05/2020: 45 mm  TTE 06/2022: Aortic root 41 mm Hyperlipidemia  OSA  Diabetes mellitus  Morbid obesity          History of Present Illness:  Discussed the use of AI scribe software for clinical note transcription with the patient, who gave verbal consent to proceed.  History of Present Illness   The patient, with a history of atrial fibrillation, aortic dilation, and diabetes, presents for a follow-up visit. He was scheduled for cardioversion several times, but the procedure was cancelled due to family issues and illness. The patient has been taking amiodarone and Pradaxa as prescribed. Since starting amiodarone, he reports less fatigue and a more stable heart rate according to his Fitbit. However, he remains in atrial fibrillation. He denies chest pain and reports no change in his chronic asthma symptoms. He experiences leg swelling if he does not elevate his legs. He denies chest pain, syncope, bloody stools, black stools, and bloody urine.      ROS: See HPI    Studies Reviewed:   EKG Interpretation Date/Time:  Wednesday November 09 2022 07:56:00 EDT Ventricular Rate:  73 PR Interval:    QRS Duration:  84 QT Interval:  402 QTC Calculation: 442 R Axis:   71  Text Interpretation: Atrial fibrillation No significant change since last tracing Confirmed by Tereso Newcomer [phone]) on 11/09/2022 8:01:58 AM    Risk Assessment/Calculations:    CHA2DS2-VASc Score = 4   This indicates a 4.8% annual risk of  stroke. The patient's score is based upon: CHF History: 0 HTN History: 1 Diabetes History: 1 Stroke History: 0 Vascular Disease History: 0 Age Score: 2 Gender Score: 0            Physical Exam:   VS:  BP 120/60   Pulse 73   Ht 5\' 11"  (1.803 m)   Wt 279 lb 12.8 oz (126.9 kg)   SpO2 96%   BMI 39.02 kg/m    Wt Readings from Last 3 Encounters:  11/09/22 279 lb 12.8 oz (126.9 kg)  11/07/22 280 lb (127 kg)  10/10/22 283 lb (128.4 kg)    Constitutional:      Appearance: Healthy appearance. Not in distress.  Pulmonary:     Breath sounds: Normal breath sounds. No wheezing. No rales.  Cardiovascular:     Normal rate. Irregularly irregular rhythm.     Murmurs: There is no murmur.  Edema:    Peripheral edema absent.  Abdominal:     Palpations: Abdomen is soft.  Musculoskeletal:     Cervical back: Neck supple.       Assessment and Plan:     Persistent Atrial Fibrillation Persistent despite amiodarone 200mg  daily. Patient reports less fatigue and more stable heart rate since starting amiodarone. Discussed plan for cardioversion, risks and benefits. -Schedule cardioversion. -Check metabolic panel and complete blood count today prior to cardioversion. -Continue amiodarone 200mg  daily and Pradaxa 150mg  twice daily. -F/u with Dr. Lalla Brothers 10/2 as planned.  Aortic Dilation Stable size at 41mm on last echocardiogram in May 2024. -Continue monitoring with regular echocardiograms.  Hyperlipidemia Intolerance to statins with severe reactions. No trials of PCSK9 inhibitors. -Managed by primary care.  Diabetes Mellitus Diet-controlled with stable A1c at 7.1 in 12/2021.  Hypertension  Controlled -Continue Cardizem CD 360 mg once daily, Losartan 50 mg once daily          Informed Consent   Shared Decision Making/Informed Consent The risks (stroke, cardiac arrhythmias rarely resulting in the need for a temporary or permanent pacemaker, skin irritation or burns and  complications associated with conscious sedation including aspiration, arrhythmia, respiratory failure and death), benefits (restoration of normal sinus rhythm) and alternatives of a direct current cardioversion were explained in detail to Mr. Feltus and he agrees to proceed.       Dispo:  Return in 3 weeks (on 11/30/2022).  Signed, Tereso Newcomer, PA-C

## 2022-11-08 NOTE — H&P (View-Only) (Signed)
Cardiology Office Note:    Date:  11/09/2022  ID:  Brian Myers, DOB Feb 18, 1946, MRN [id number] PCP: Myrlene Broker, MD  [facility] Providers Cardiologist:  Lesleigh Noe, MD (Inactive) Electrophysiologist:  Lanier Prude, MD       Patient Profile:      Persistent atrial fibrillation  TTE 07/04/22: EF 60-65, no RWMA, mild asymmetric LVH, NL RVSF, mild BAE, trivial MR, AV sclerosis, Ao root 41 mm Monitor 06/2022: AFib, Avg HR 82 Dilated ascending aorta TEE 05/2020: 45 mm  TTE 06/2022: Aortic root 41 mm Hyperlipidemia  OSA  Diabetes mellitus  Morbid obesity          History of Present Illness:  Discussed the use of AI scribe software for clinical note transcription with the patient, who gave verbal consent to proceed.  History of Present Illness   The patient, with a history of atrial fibrillation, aortic dilation, and diabetes, presents for a follow-up visit. He was scheduled for cardioversion several times, but the procedure was cancelled due to family issues and illness. The patient has been taking amiodarone and Pradaxa as prescribed. Since starting amiodarone, he reports less fatigue and a more stable heart rate according to his Fitbit. However, he remains in atrial fibrillation. He denies chest pain and reports no change in his chronic asthma symptoms. He experiences leg swelling if he does not elevate his legs. He denies chest pain, syncope, bloody stools, black stools, and bloody urine.      ROS: See HPI    Studies Reviewed:   EKG Interpretation Date/Time:  Wednesday November 09 2022 07:56:00 EDT Ventricular Rate:  73 PR Interval:    QRS Duration:  84 QT Interval:  402 QTC Calculation: 442 R Axis:   71  Text Interpretation: Atrial fibrillation No significant change since last tracing Confirmed by Tereso Newcomer [phone]) on 11/09/2022 8:01:58 AM    Risk Assessment/Calculations:    CHA2DS2-VASc Score = 4   This indicates a 4.8% annual risk of  stroke. The patient's score is based upon: CHF History: 0 HTN History: 1 Diabetes History: 1 Stroke History: 0 Vascular Disease History: 0 Age Score: 2 Gender Score: 0            Physical Exam:   VS:  BP 120/60   Pulse 73   Ht 5\' 11"  (1.803 m)   Wt 279 lb 12.8 oz (126.9 kg)   SpO2 96%   BMI 39.02 kg/m    Wt Readings from Last 3 Encounters:  11/09/22 279 lb 12.8 oz (126.9 kg)  11/07/22 280 lb (127 kg)  10/10/22 283 lb (128.4 kg)    Constitutional:      Appearance: Healthy appearance. Not in distress.  Pulmonary:     Breath sounds: Normal breath sounds. No wheezing. No rales.  Cardiovascular:     Normal rate. Irregularly irregular rhythm.     Murmurs: There is no murmur.  Edema:    Peripheral edema absent.  Abdominal:     Palpations: Abdomen is soft.  Musculoskeletal:     Cervical back: Neck supple.       Assessment and Plan:     Persistent Atrial Fibrillation Persistent despite amiodarone 200mg  daily. Patient reports less fatigue and more stable heart rate since starting amiodarone. Discussed plan for cardioversion, risks and benefits. -Schedule cardioversion. -Check metabolic panel and complete blood count today prior to cardioversion. -Continue amiodarone 200mg  daily and Pradaxa 150mg  twice daily. -F/u with Dr. Lalla Brothers 10/2 as planned.  Aortic Dilation Stable size at 41mm on last echocardiogram in May 2024. -Continue monitoring with regular echocardiograms.  Hyperlipidemia Intolerance to statins with severe reactions. No trials of PCSK9 inhibitors. -Managed by primary care.  Diabetes Mellitus Diet-controlled with stable A1c at 7.1 in 12/2021.  Hypertension  Controlled -Continue Cardizem CD 360 mg once daily, Losartan 50 mg once daily          Informed Consent   Shared Decision Making/Informed Consent The risks (stroke, cardiac arrhythmias rarely resulting in the need for a temporary or permanent pacemaker, skin irritation or burns and  complications associated with conscious sedation including aspiration, arrhythmia, respiratory failure and death), benefits (restoration of normal sinus rhythm) and alternatives of a direct current cardioversion were explained in detail to Brian Myers and he agrees to proceed.       Dispo:  Return in 3 weeks (on 11/30/2022).  Signed, Tereso Newcomer, PA-C

## 2022-11-09 ENCOUNTER — Ambulatory Visit: Payer: Medicare Other | Attending: Physician Assistant | Admitting: Physician Assistant

## 2022-11-09 ENCOUNTER — Encounter: Payer: Self-pay | Admitting: Physician Assistant

## 2022-11-09 VITALS — BP 120/60 | HR 73 | Ht 71.0 in | Wt 279.8 lb

## 2022-11-09 DIAGNOSIS — E785 Hyperlipidemia, unspecified: Secondary | ICD-10-CM | POA: Diagnosis not present

## 2022-11-09 DIAGNOSIS — I77810 Thoracic aortic ectasia: Secondary | ICD-10-CM | POA: Insufficient documentation

## 2022-11-09 DIAGNOSIS — I4819 Other persistent atrial fibrillation: Secondary | ICD-10-CM | POA: Diagnosis present

## 2022-11-09 DIAGNOSIS — E1169 Type 2 diabetes mellitus with other specified complication: Secondary | ICD-10-CM | POA: Insufficient documentation

## 2022-11-09 DIAGNOSIS — I10 Essential (primary) hypertension: Secondary | ICD-10-CM | POA: Insufficient documentation

## 2022-11-09 DIAGNOSIS — E118 Type 2 diabetes mellitus with unspecified complications: Secondary | ICD-10-CM | POA: Diagnosis not present

## 2022-11-09 NOTE — Patient Instructions (Signed)
Medication Instructions:  Your physician recommends that you continue on your current medications as directed. Please refer to the Current Medication list given to you today.  *If you need a refill on your cardiac medications before your next appointment, please call your pharmacy*   Lab Work: TODAY:  BMET & CBC  If you have labs (blood work) drawn today and your tests are completely normal, you will receive your results only by: MyChart Message (if you have MyChart) OR A paper copy in the mail If you have any lab test that is abnormal or we need to change your treatment, we will call you to review the results.   Testing/Procedures: Your physician has recommended that you have a Cardioversion (DCCV). Electrical Cardioversion uses a jolt of electricity to your heart either through paddles or wired patches attached to your chest. This is a controlled, usually prescheduled, procedure. Defibrillation is done under light anesthesia in the hospital, and you usually go home the day of the procedure. This is done to get your heart back into a normal rhythm. You are not awake for the procedure. Please see the instruction sheet BELOW:      Dear Brian Myers  You are scheduled for a Cardioversion on Friday, September 13 with Dr. Royann Shivers.  Please arrive at the [facility] (Main Entrance A) at [facility]: [address] [place], [place] [address] at 11:30 AM (This time is 1 hour(s) before your procedure to ensure your preparation). Free valet parking service is available. You will check in at ADMITTING. The support person will be asked to wait in the waiting room.  It is OK to have someone drop you off and come back when you are ready to be discharged.      DIET:  Nothing to eat or drink after midnight except a sip of water with medications (see medication instructions below)  MEDICATION INSTRUCTIONS: !!IF ANY NEW MEDICATIONS ARE STARTED AFTER TODAY, PLEASE NOTIFY YOUR PROVIDER AS SOON  AS POSSIBLE!!  FYI: Medications such as Semaglutide (Ozempic, [place]), Tirzepatide (Mounjaro, Zepbound), Dulaglutide (Trulicity), etc ("GLP1 agonists") AND Canagliflozin (Invokana), Dapagliflozin (Farxiga), Empagliflozin (Jardiance), Ertugliflozin (Steglatro), Bexagliflozin [employer]) or any combination with one of these drugs such as Invokamet (Canagliflozin/Metformin), Synjardy (Empagliflozin/Metformin), etc ("SGLT2 inhibitors") must be held around the time of a procedure. This is not a comprehensive list of all of these drugs. Please review all of your medications and talk to your provider if you take any one of these. If you are not sure, ask your provider.     Continue taking your anticoagulant (blood thinner):  Pradaxa .  You will need to continue this after your procedure until you are told by your provider that it is safe to stop.    LABS: TODAY  FYI:  For your safety, and to allow [place] to monitor your vital signs accurately during the surgery/procedure we request: If you have artificial nails, gel coating, SNS etc, please have those removed prior to your surgery/procedure. Not having the nail coverings /polish removed may result in cancellation or delay of your surgery/procedure.  You must have a responsible person to drive you home and stay in the waiting area during your procedure. Failure to do so could result in cancellation.  Bring your insurance cards.  *Special Note: Every effort is made to have your procedure done on time. Occasionally there are emergencies that occur at the hospital that may cause delays. Please be patient if a delay does occur.  Follow-Up: At [facility], you and your health needs are our priority.  As part of our continuing mission to provide you with exceptional heart care, we have created designated Provider Care Teams.  These Care Teams include your primary Cardiologist (physician) and Advanced Practice Providers (APPs -  Physician  Assistants and Nurse Practitioners) who all work together to provide you with the care you need, when you need it.  We recommend signing up for the patient portal called "MyChart".  Sign up information is provided on this After Visit Summary.  MyChart is used to connect with patients for Virtual Visits (Telemedicine).  Patients are able to view lab/test results, encounter notes, upcoming appointments, etc.  Non-urgent messages can be sent to your provider as well.   To learn more about what you can do with MyChart, go to [email or web address].    Your next appointment:   As scheduled   Provider:   Steffanie Dunn, MD    Other Instructions

## 2022-11-10 LAB — CBC
Hematocrit: 36.8 % — ABNORMAL LOW (ref 37.5–51.0)
Hemoglobin: 11.5 g/dL — ABNORMAL LOW (ref 13.0–17.7)
MCH: 25.4 pg — ABNORMAL LOW (ref 26.6–33.0)
MCHC: 31.3 g/dL — ABNORMAL LOW (ref 31.5–35.7)
MCV: 81 fL (ref 79–97)
Platelets: 244 10*3/uL (ref 150–450)
RBC: 4.52 x10E6/uL (ref 4.14–5.80)
RDW: 17.2 % — ABNORMAL HIGH (ref 11.6–15.4)
WBC: 5.6 10*3/uL (ref 3.4–10.8)

## 2022-11-10 LAB — BASIC METABOLIC PANEL
BUN/Creatinine Ratio: 13 (ref 10–24)
BUN: 12 mg/dL (ref 8–27)
CO2: 27 mmol/L (ref 20–29)
Calcium: 8.9 mg/dL (ref 8.6–10.2)
Chloride: 98 mmol/L (ref 96–106)
Creatinine, Ser: 0.94 mg/dL (ref 0.76–1.27)
Glucose: 97 mg/dL (ref 70–99)
Potassium: 3.9 mmol/L (ref 3.5–5.2)
Sodium: 138 mmol/L (ref 134–144)
eGFR: 85 mL/min/{1.73_m2} (ref >59)

## 2022-11-10 LAB — HM DIABETES EYE EXAM

## 2022-11-10 NOTE — Progress Notes (Signed)
Unable to reach patient about procedure, but was able to leave a detailed message. Stated that the patient needed to arrive at the hospital at 1000 , remain NPO after 0000, needs to have a ride home and a responsible adult to stay with them for 24 hours after the procedure. Instructed the patient to call back if they had any questions.

## 2022-11-11 ENCOUNTER — Encounter (HOSPITAL_COMMUNITY)
Admission: RE | Disposition: A | Discharge status: Home / Self Care | Payer: Self-pay | Source: Ambulatory Visit | Attending: Cardiovascular Disease

## 2022-11-11 ENCOUNTER — Other Ambulatory Visit: Payer: Self-pay

## 2022-11-11 ENCOUNTER — Ambulatory Visit (HOSPITAL_COMMUNITY): Payer: Medicare Other | Admitting: Anesthesiology

## 2022-11-11 ENCOUNTER — Ambulatory Visit (HOSPITAL_COMMUNITY)
Admission: RE | Admit: 2022-11-11 | Discharge: 2022-11-11 | Disposition: A | Discharge status: Home / Self Care | Payer: Medicare Other | Source: Ambulatory Visit | Attending: Cardiovascular Disease | Admitting: Cardiovascular Disease

## 2022-11-11 DIAGNOSIS — G4733 Obstructive sleep apnea (adult) (pediatric): Secondary | ICD-10-CM | POA: Insufficient documentation

## 2022-11-11 DIAGNOSIS — I7121 Aneurysm of the ascending aorta, without rupture: Secondary | ICD-10-CM | POA: Diagnosis not present

## 2022-11-11 DIAGNOSIS — E785 Hyperlipidemia, unspecified: Secondary | ICD-10-CM | POA: Insufficient documentation

## 2022-11-11 DIAGNOSIS — E119 Type 2 diabetes mellitus without complications: Secondary | ICD-10-CM | POA: Insufficient documentation

## 2022-11-11 DIAGNOSIS — I4819 Other persistent atrial fibrillation: Secondary | ICD-10-CM

## 2022-11-11 DIAGNOSIS — J45909 Unspecified asthma, uncomplicated: Secondary | ICD-10-CM | POA: Diagnosis not present

## 2022-11-11 DIAGNOSIS — I10 Essential (primary) hypertension: Secondary | ICD-10-CM | POA: Diagnosis not present

## 2022-11-11 DIAGNOSIS — Z79899 Other long term (current) drug therapy: Secondary | ICD-10-CM | POA: Insufficient documentation

## 2022-11-11 DIAGNOSIS — Z7902 Long term (current) use of antithrombotics/antiplatelets: Secondary | ICD-10-CM | POA: Insufficient documentation

## 2022-11-11 DIAGNOSIS — Z6839 Body mass index (BMI) 39.0-39.9, adult: Secondary | ICD-10-CM | POA: Diagnosis not present

## 2022-11-11 DIAGNOSIS — I4891 Unspecified atrial fibrillation: Secondary | ICD-10-CM

## 2022-11-11 HISTORY — PX: CARDIOVERSION: SHX1299

## 2022-11-11 LAB — GLUCOSE, CAPILLARY: Glucose-Capillary: 113 mg/dL — ABNORMAL HIGH (ref 70–99)

## 2022-11-11 SURGERY — CARDIOVERSION
Anesthesia: Monitor Anesthesia Care

## 2022-11-11 MED ORDER — PROPOFOL 10 MG/ML IV BOLUS
INTRAVENOUS | Status: DC | PRN
  Administered 2022-11-11: 20 mg via INTRAVENOUS
  Administered 2022-11-11: 100 mg via INTRAVENOUS

## 2022-11-11 MED ORDER — SODIUM CHLORIDE 0.9 % IV SOLN: INTRAVENOUS | Status: DC

## 2022-11-11 MED ORDER — HYDROCORTISONE 1 % EX CREA
1.0000 | TOPICAL_CREAM | Freq: Three times a day (TID) | CUTANEOUS | Status: DC | PRN
  Filled 2022-11-11: qty 28

## 2022-11-11 SURGICAL SUPPLY — 1 items: ELECT DEFIB PAD ADLT CADENCE (PAD) ×1 IMPLANT

## 2022-11-11 NOTE — Transfer of Care (Signed)
Immediate Anesthesia Transfer of Care Note  Patient: Brian Myers  Procedure(s) Performed: CARDIOVERSION  Patient Location: PACU  Anesthesia Type:MAC  Level of Consciousness: awake, alert , and patient cooperative  Airway & Oxygen Therapy: Patient Spontanous Breathing and Patient connected to face mask oxygen  Post-op Assessment: Report given to RN and Post -op Vital signs reviewed and stable  Post vital signs: Reviewed and stable  Last Vitals:  Vitals Value Taken Time  BP    Temp    Pulse 72 11/11/22 1240  Resp 13 11/11/22 1240  SpO2 99 % 11/11/22 1240  Vitals shown include unfiled device data.  Last Pain:  Vitals:   11/11/22 1146  TempSrc:   PainSc: 0-No pain         Complications: No notable events documented.

## 2022-11-11 NOTE — Op Note (Signed)
Procedure: Electrical Cardioversion Indications:  Atrial Fibrillation  Procedure Details:  Consent: Risks of procedure as well as the alternatives and risks of each were explained to the (patient/caregiver).  Consent for procedure obtained.  Time Out: Verified patient identification, verified procedure, site/side was marked, verified correct patient position, special equipment/implants available, medications/allergies/relevent history reviewed, required imaging and test results available.  Performed  Patient placed on cardiac monitor, pulse oximetry, supplemental oxygen as necessary.  Sedation given:  propofol 120 mg IV, Dr/ Ellender Pacer pads placed anterior and posterior chest.  Cardioverted 1 time(s).  Cardioversion with synchronized biphasic 200J shock.  Evaluation: Findings: Post procedure EKG shows: NSR Complications: None Patient did tolerate procedure well.  Time Spent Directly with the Patient:  30 minutes   Brian Myers 11/11/2022, 12:48 PM

## 2022-11-11 NOTE — Interval H&P Note (Signed)
History and Physical Interval Note:  11/11/2022 11:26 AM  Brian Myers  has presented today for surgery, with the diagnosis of afib.  The various methods of treatment have been discussed with the patient and family. After consideration of risks, benefits and other options for treatment, the patient has consented to  Procedure(s): CARDIOVERSION (N/A) as a surgical intervention.  The patient's history has been reviewed, patient examined, no change in status, stable for surgery.  I have reviewed the patient's chart and labs.  Questions were answered to the patient's satisfaction.     Brian Myers

## 2022-11-11 NOTE — Anesthesia Preprocedure Evaluation (Addendum)
Anesthesia Evaluation  Patient identified by MRN, date of birth, ID band Patient awake    Reviewed: Allergy & Precautions, NPO status , Patient's Chart, lab work & pertinent test results  Airway Mallampati: III  TM Distance: >3 FB Neck ROM: Full    Dental no notable dental hx.    Pulmonary asthma , sleep apnea and Continuous Positive Airway Pressure Ventilation    Pulmonary exam normal        Cardiovascular hypertension, Pt. on medications Normal cardiovascular exam+ dysrhythmias Atrial Fibrillation      Neuro/Psych negative neurological ROS  negative psych ROS   GI/Hepatic Neg liver ROS, hiatal hernia,GERD  Medicated and Controlled,,  Endo/Other  negative endocrine ROS    Renal/GU negative Renal ROS     Musculoskeletal  (+) Arthritis ,    Abdominal  (+) + obese  Peds  Hematology  (+) Blood dyscrasia, anemia   Anesthesia Other Findings A-fib  Reproductive/Obstetrics                             Anesthesia Physical Anesthesia Plan  ASA: 3  Anesthesia Plan: MAC   Post-op Pain Management:    Induction:   PONV Risk Score and Plan: 1 and Propofol infusion and Treatment may vary due to age or medical condition  Airway Management Planned: Simple Face Mask  Additional Equipment:   Intra-op Plan:   Post-operative Plan:   Informed Consent: I have reviewed the patients History and Physical, chart, labs and discussed the procedure including the risks, benefits and alternatives for the proposed anesthesia with the patient or authorized representative who has indicated his/her understanding and acceptance.     Dental advisory given  Plan Discussed with: CRNA  Anesthesia Plan Comments:        Anesthesia Quick Evaluation

## 2022-11-11 NOTE — Anesthesia Postprocedure Evaluation (Signed)
Anesthesia Post Note  Patient: Brian Myers  Procedure(s) Performed: CARDIOVERSION     Patient location during evaluation: Cath Lab Anesthesia Type: MAC Level of consciousness: awake Pain management: pain level controlled Vital Signs Assessment: post-procedure vital signs reviewed and stable Respiratory status: spontaneous breathing, nonlabored ventilation and respiratory function stable Cardiovascular status: blood pressure returned to baseline and stable Postop Assessment: no apparent nausea or vomiting Anesthetic complications: no   No notable events documented.  Last Vitals:  Vitals:   11/11/22 1310 11/11/22 1315  BP: (!) 142/96 (!) 145/96  Pulse: 66 66  Resp: 15 13  Temp:  36.6 C  SpO2: 95% 95%    Last Pain:  Vitals:   11/11/22 1315  TempSrc: Temporal  PainSc: 0-No pain                 Luv Mish P Tesha Archambeau

## 2022-11-14 ENCOUNTER — Encounter: Payer: Self-pay | Admitting: Cardiology

## 2022-11-14 ENCOUNTER — Encounter (HOSPITAL_COMMUNITY): Payer: Self-pay | Admitting: Cardiovascular Disease

## 2022-11-14 NOTE — Telephone Encounter (Signed)
Agree. If symptoms are stable and HR is ok (<110), follow up with Dr. Lalla Brothers as planned. If pt is feeling poorly or HR is high, arrange follow up in the AFib clinic this week. If nothing available with AFib clinic I can see him tomorrow at 8:25 am. Tereso Newcomer, PA-C    11/14/2022 1:51 PM

## 2022-11-14 NOTE — Telephone Encounter (Signed)
Pt advised Scott's recommendations an he says his HR has been well below 110 and he has been feeling well except for some fatigue yesterday but well today. He plans to wait until his OV 10/2 and will monitor and call if he needs [place] sooner.

## 2022-11-14 NOTE — Telephone Encounter (Signed)
Left a message for the pt to call back.

## 2022-11-28 MED ORDER — AMIODARONE HCL 200 MG PO TABS: 200.0000 mg | ORAL_TABLET | Freq: Every morning | ORAL | 3 refills | Status: DC

## 2022-11-28 NOTE — Addendum Note (Signed)
Addended by: Frutoso Schatz on: 11/28/2022 04:50 PM   Modules accepted: Orders

## 2022-11-28 NOTE — Telephone Encounter (Signed)
Agree with Dr Lalla Brothers, drug interaction is more of an issue in patients with renal dysfunction. His kidney function is fine. Other DOACs carry similar interaction with amiodarone. Ok to continue/refill Pradaxa.

## 2022-11-30 ENCOUNTER — Ambulatory Visit: Payer: Medicare Other | Admitting: Cardiology

## 2022-12-11 NOTE — Progress Notes (Unsigned)
Electrophysiology Office Follow up Visit Note:    Date:  12/12/2022   ID:  Brian Myers, DOB [birth year], MRN [id number]  PCP:  Brian Broker, MD  [facility] Cardiologist:  Brian Noe, MD (Inactive)  [facility] Electrophysiologist:  Brian Prude, MD    Interval History:    Brian Myers is a 77 y.o. male who presents for a follow up visit.   Last seen 09/08/2022 for persistent AF. At the last appt, it sounded like he had been in AF for a long time. He takes pradaxa. I started amiodarone and recommended a dCCV in 4 weeks.   DCCV was performed on 11/11/2022 successfully.  He stayed in sinus for several days before returning to AF.   Today he is with his wife.  He tells me that he felt much better when he was back in rhythm for the few days.  He could immediately tell when he returned to atrial fibrillation.  He has not missed any doses of Pradaxa.  He continues to take amiodarone.        Past medical, surgical, social and family history were reviewed.  ROS:   Please see the history of present illness.    All other systems reviewed and are negative.  EKGs/Labs/Other Studies Reviewed:    The following studies were reviewed today:  07/04/2022 echo EF 60 RV normal Dilated LA   EKG Interpretation Date/Time:  Monday December 12 2022 15:22:12 EDT Ventricular Rate:  80 PR Interval:    QRS Duration:  88 QT Interval:  294 QTC Calculation: 339 R Axis:   73  Text Interpretation: Atrial fibrillation Confirmed by Steffanie Dunn [phone]) on 12/12/2022 3:36:49 PM    Physical Exam:    VS:  BP 130/70   Pulse 80   Ht 5\' 11"  (1.803 m)   Wt 279 lb (126.6 kg)   SpO2 95%   BMI 38.91 kg/m     Wt Readings from Last 3 Encounters:  12/12/22 279 lb (126.6 kg)  11/11/22 280 lb (127 kg)  11/09/22 279 lb 12.8 oz (126.9 kg)     GEN:  Well nourished, well developed in no acute distress.  Obese CARDIAC: Irregularly irregular, no murmurs, rubs,  gallops RESPIRATORY:  Clear to auscultation without rales, wheezing or rhonchi       ASSESSMENT:    1. Persistent atrial fibrillation (HCC)   2. Primary hypertension   3. Encounter for long-term (current) use of high-risk medication    PLAN:    In order of problems listed above:  #Persistent AF #High risk drug monitoring - amiodarone Now on amiodarone. Maintained rhythm for several days before returning to AF. I have discussed treatment options with the patient including rate control only, trying another DCCV given more time to load amiodarone or pursuing catheter ablation.  --------  Discussed treatment options today for AF including antiarrhythmic drug therapy and ablation. Discussed risks, recovery and likelihood of success with each treatment strategy. Risk, benefits, and alternatives to EP study and ablation for afib were discussed. These risks include but are not limited to stroke, bleeding, vascular damage, tamponade, perforation, damage to the esophagus, lungs, phrenic nerve and other structures, pulmonary vein stenosis, worsening renal function, coronary vasospasm and death.  Discussed potential need for repeat ablation procedures and antiarrhythmic drugs after an initial ablation. The patient understands these risk and wishes to proceed.  We will therefore proceed with catheter ablation at the next available time.  Carto, ICE, anesthesia  are requested for the procedure.  Will also obtain CT PV protocol prior to the procedure to exclude LAA thrombus and further evaluate atrial anatomy.  -------  Prior to the PVI, plan for repeat DCCV. I have discussed the procedure in detail including the risks and he wishes to proceed. No missed doses of OAC in the past 4 weeks.  ------  Check CMP, TSH and FT4 prior to cardioversion.   #Hypertension At goal today.  Recommend checking blood pressures 1-2 times per week at home and recording the values.  Recommend bringing these recordings  to the primary care physician.      Signed, Steffanie Dunn, MD, [facility], [facility] 12/12/2022 3:36 PM    Electrophysiology [facility]

## 2022-12-12 ENCOUNTER — Ambulatory Visit: Payer: Medicare Other | Attending: Cardiology | Admitting: Cardiology

## 2022-12-12 ENCOUNTER — Encounter: Payer: Self-pay | Admitting: Cardiology

## 2022-12-12 VITALS — BP 130/70 | HR 80 | Ht 71.0 in | Wt 279.0 lb

## 2022-12-12 DIAGNOSIS — Z79899 Other long term (current) drug therapy: Secondary | ICD-10-CM | POA: Insufficient documentation

## 2022-12-12 DIAGNOSIS — I4819 Other persistent atrial fibrillation: Secondary | ICD-10-CM | POA: Insufficient documentation

## 2022-12-12 DIAGNOSIS — I10 Essential (primary) hypertension: Secondary | ICD-10-CM | POA: Diagnosis not present

## 2022-12-12 MED ORDER — DABIGATRAN ETEXILATE MESYLATE 150 MG PO CAPS: 150.0000 mg | ORAL_CAPSULE | Freq: Two times a day (BID) | ORAL | 1 refills | Status: DC

## 2022-12-12 NOTE — Patient Instructions (Addendum)
Medication Instructions:  Your physician recommends that you continue on your current medications as directed. Please refer to the Current Medication list given to you today.  *If you need a refill on your cardiac medications before your next appointment, please call your pharmacy*   Lab Work: CMET, CBC, TSH, T4 at your visit on 12/02  Testing/Procedures: Your physician has recommended that you have a Cardioversion (DCCV). Electrical Cardioversion uses a jolt of electricity to your heart either through paddles or wired patches attached to your chest. This is a controlled, usually prescheduled, procedure. Defibrillation is done under light anesthesia in the hospital, and you usually go home the day of the procedure. This is done to get your heart back into a normal rhythm. You are not awake for the procedure. Please see the instruction sheet given to you today.  Your physician has requested that you have cardiac CT. Cardiac computed tomography (CT) is a painless test that uses an x-ray machine to take clear, detailed pictures of your heart. For further information please visit [email or web address] We will call you to schedule your CT scan. It will be done about 3 weeks prior to your ablation.   Your physician has recommended that you have an ablation. Catheter ablation is a medical procedure used to treat some cardiac arrhythmias (irregular heartbeats). During catheter ablation, a long, thin, flexible tube is put into a blood vessel in your groin (upper thigh), or neck. This tube is called an ablation catheter. It is then guided to your heart through the blood vessel. Radio frequency waves destroy small areas of heart tissue where abnormal heartbeats may cause an arrhythmia to start. You are scheduled for Atrial Fibrillation Ablation on Monday, February 2 with Dr. Steffanie Dunn.Please arrive at the Main Entrance A at [facility]: [address] [place], [place] [address] at 5:30 AM     Follow-Up: At [facility], you and your health needs are our priority.  As part of our continuing mission to provide you with exceptional heart care, we have created designated Provider Care Teams.  These Care Teams include your primary Cardiologist (physician) and Advanced Practice Providers (APPs -  Physician Assistants and Nurse Practitioners) who all work together to provide you with the care you need, when you need it.  Your next appointment:   1 month  Provider:   You will see one of the following Advanced Practice Providers on your designated Care Team:   Francis Dowse, [place] [address] [place], [place] Sherie Don, NP Canary Brim, NP

## 2022-12-13 ENCOUNTER — Telehealth: Payer: Self-pay

## 2022-12-13 NOTE — Telephone Encounter (Signed)
Called patient to schedule in lab sleep study that was ordered by Dr. Vickey Huger. Pt is wanting to complete the 2nd cardioversion in early November before scheduling sleep study. Pt stated he will call back once he is recovered from that to schedule study at that time.

## 2022-12-20 DIAGNOSIS — Z23 Encounter for immunization: Secondary | ICD-10-CM | POA: Diagnosis not present

## 2023-01-17 NOTE — Progress Notes (Unsigned)
  Electrophysiology Office Note:   Date:  01/18/2023  ID:  COWEN JEWART, DOB 1945-07-26, MRN [id number]  Primary Cardiologist: Lesleigh Noe, MD (Inactive) Electrophysiologist: Lanier Prude, MD      History of Present Illness:   Brian Myers is a 77 y.o. male with h/o OSA w/CPAP, HLD, DM, PAF, and morbid obesity seen today for routine electrophysiology followup prior to cardioversion.   Previously, seen 09/08/2022 for persistent AF. Started on amiodarone.    DCCV was performed on 11/11/2022 successfully. He stayed in sinus for several days before returning to AF.   He felt much better when he was in rhythm. Planned for an additional cardioversion on amiodarone as bridge to ablation.   Seen today with plans for [facility] 12/2.   Since last being seen in our clinic the patient reports doing about the same. Does have fatigue while he is in AF. He denies chest pain, palpitations, dyspnea, PND, orthopnea, nausea, vomiting, dizziness, syncope, edema, weight gain, or early satiety.   Review of systems complete and found to be negative unless listed in HPI.   EP Information / Studies Reviewed:    EKG is ordered today. Personal review as below.  EKG Interpretation Date/Time:  Wednesday January 18 2023 11:01:52 EST Ventricular Rate:  64 PR Interval:    QRS Duration:  84 QT Interval:  400 QTC Calculation: 412 R Axis:   71  Text Interpretation: Atrial fibrillation Confirmed by Maxine Glenn [id number] on 01/18/2023 12:39:59 PM    Echo 06/2022 LVEF 60-65%, normal RV, mild LAE, mild RAE, trivial MR  Physical Exam:   VS:  BP 118/82   Pulse 64   Ht 5\' 11"  (1.803 m)   Wt 282 lb 12.8 oz (128.3 kg)   SpO2 94%   BMI 39.44 kg/m    Wt Readings from Last 3 Encounters:  01/18/23 282 lb 12.8 oz (128.3 kg)  12/12/22 279 lb (126.6 kg)  11/11/22 280 lb (127 kg)     GEN: Well nourished, well developed in no acute distress NECK: No JVD; No carotid bruits CARDIAC: Irregularly  irregular rate and rhythm, no murmurs, rubs, gallops RESPIRATORY:  Clear to auscultation without rales, wheezing or rhonchi  ABDOMEN: Soft, non-tender, non-distended EXTREMITIES:  No edema; No deformity   ASSESSMENT AND PLAN:    Persistent AF High risk drug monitoring - amiodarone EKG shows AF. Continue pradaxa 150 mg BID without missed doses Continue amiodarone 200 mg daily Plan for cardioversion 12/5 and will follow up after.  Plan for ablation after cardioversion. CT has been ordered.  Will clarify timing after making sure he can maintain sinus  HTN Stable on current regimen   Follow up with EP APP  in 1-2 weeks after [facility]   Signed, Graciella Freer, PA-C

## 2023-01-17 NOTE — H&P (View-Only) (Signed)
  Electrophysiology Office Note:   Date:  01/18/2023  ID:  Brian Myers, DOB 1945-07-26, MRN [id number]  Primary Cardiologist: Lesleigh Noe, MD (Inactive) Electrophysiologist: Lanier Prude, MD      History of Present Illness:   Brian Myers is a 77 y.o. male with h/o OSA w/CPAP, HLD, DM, PAF, and morbid obesity seen today for routine electrophysiology followup prior to cardioversion.   Previously, seen 09/08/2022 for persistent AF. Started on amiodarone.    DCCV was performed on 11/11/2022 successfully. He stayed in sinus for several days before returning to AF.   He felt much better when he was in rhythm. Planned for an additional cardioversion on amiodarone as bridge to ablation.   Seen today with plans for [facility] 12/2.   Since last being seen in our clinic the patient reports doing about the same. Does have fatigue while he is in AF. He denies chest pain, palpitations, dyspnea, PND, orthopnea, nausea, vomiting, dizziness, syncope, edema, weight gain, or early satiety.   Review of systems complete and found to be negative unless listed in HPI.   EP Information / Studies Reviewed:    EKG is ordered today. Personal review as below.  EKG Interpretation Date/Time:  Wednesday January 18 2023 11:01:52 EST Ventricular Rate:  64 PR Interval:    QRS Duration:  84 QT Interval:  400 QTC Calculation: 412 R Axis:   71  Text Interpretation: Atrial fibrillation Confirmed by Maxine Glenn [id number] on 01/18/2023 12:39:59 PM    Echo 06/2022 LVEF 60-65%, normal RV, mild LAE, mild RAE, trivial MR  Physical Exam:   VS:  BP 118/82   Pulse 64   Ht 5\' 11"  (1.803 m)   Wt 282 lb 12.8 oz (128.3 kg)   SpO2 94%   BMI 39.44 kg/m    Wt Readings from Last 3 Encounters:  01/18/23 282 lb 12.8 oz (128.3 kg)  12/12/22 279 lb (126.6 kg)  11/11/22 280 lb (127 kg)     GEN: Well nourished, well developed in no acute distress NECK: No JVD; No carotid bruits CARDIAC: Irregularly  irregular rate and rhythm, no murmurs, rubs, gallops RESPIRATORY:  Clear to auscultation without rales, wheezing or rhonchi  ABDOMEN: Soft, non-tender, non-distended EXTREMITIES:  No edema; No deformity   ASSESSMENT AND PLAN:    Persistent AF High risk drug monitoring - amiodarone EKG shows AF. Continue pradaxa 150 mg BID without missed doses Continue amiodarone 200 mg daily Plan for cardioversion 12/5 and will follow up after.  Plan for ablation after cardioversion. CT has been ordered.  Will clarify timing after making sure he can maintain sinus  HTN Stable on current regimen   Follow up with EP APP  in 1-2 weeks after [facility]   Signed, Graciella Freer, PA-C

## 2023-01-18 ENCOUNTER — Encounter: Payer: Self-pay | Admitting: Student

## 2023-01-18 ENCOUNTER — Ambulatory Visit: Payer: Medicare Other

## 2023-01-18 ENCOUNTER — Ambulatory Visit: Payer: Medicare Other | Attending: Student | Admitting: Student

## 2023-01-18 VITALS — BP 118/82 | HR 64 | Ht 71.0 in | Wt 282.8 lb

## 2023-01-18 DIAGNOSIS — I4819 Other persistent atrial fibrillation: Secondary | ICD-10-CM | POA: Insufficient documentation

## 2023-01-18 DIAGNOSIS — I10 Essential (primary) hypertension: Secondary | ICD-10-CM | POA: Insufficient documentation

## 2023-01-18 DIAGNOSIS — Z79899 Other long term (current) drug therapy: Secondary | ICD-10-CM | POA: Insufficient documentation

## 2023-01-18 NOTE — Addendum Note (Signed)
Addended by: Valrie Hart on: 01/18/2023 11:47 AM   Modules accepted: Orders

## 2023-01-18 NOTE — Patient Instructions (Signed)
 Medication Instructions:  Your physician recommends that you continue on your current medications as directed. Please refer to the Current Medication list given to you today.  *If you need a refill on your cardiac medications before your next appointment, please call your pharmacy*  Lab Work: None ordered If you have labs (blood work) drawn today and your tests are completely normal, you will receive your results only by: MyChart Message (if you have MyChart) OR A paper copy in the mail If you have any lab test that is abnormal or we need to change your treatment, we will call you to review the results.  Follow-Up: At [facility], you and your health needs are our priority.  As part of our continuing mission to provide you with exceptional heart care, we have created designated Provider Care Teams.  These Care Teams include your primary Cardiologist (physician) and Advanced Practice Providers (APPs -  Physician Assistants and Nurse Practitioners) who all work together to provide you with the care you need, when you need it.  Your next appointment:   As scheduled

## 2023-01-19 ENCOUNTER — Other Ambulatory Visit: Payer: Self-pay | Admitting: Physician Assistant

## 2023-01-19 ENCOUNTER — Other Ambulatory Visit: Payer: Self-pay | Admitting: Internal Medicine

## 2023-01-19 DIAGNOSIS — J4520 Mild intermittent asthma, uncomplicated: Secondary | ICD-10-CM

## 2023-01-19 LAB — COMPREHENSIVE METABOLIC PANEL
ALT: 20 [IU]/L (ref 0–44)
AST: 23 [IU]/L (ref 0–40)
Albumin: 4.2 g/dL (ref 3.8–4.8)
Alkaline Phosphatase: 60 [IU]/L (ref 44–121)
BUN/Creatinine Ratio: 18 (ref 10–24)
BUN: 18 mg/dL (ref 8–27)
Bilirubin Total: 0.5 mg/dL (ref 0.0–1.2)
CO2: 28 mmol/L (ref 20–29)
Calcium: 9.4 mg/dL (ref 8.6–10.2)
Chloride: 100 mmol/L (ref 96–106)
Creatinine, Ser: 0.98 mg/dL (ref 0.76–1.27)
Globulin, Total: 2.9 g/dL (ref 1.5–4.5)
Glucose: 99 mg/dL (ref 70–99)
Potassium: 4.5 mmol/L (ref 3.5–5.2)
Sodium: 141 mmol/L (ref 134–144)
Total Protein: 7.1 g/dL (ref 6.0–8.5)
eGFR: 80 mL/min/{1.73_m2} (ref >59)

## 2023-01-19 LAB — CBC
Hematocrit: 38.8 % (ref 37.5–51.0)
Hemoglobin: 12.2 g/dL — ABNORMAL LOW (ref 13.0–17.7)
MCH: 28 pg (ref 26.6–33.0)
MCHC: 31.4 g/dL — ABNORMAL LOW (ref 31.5–35.7)
MCV: 89 fL (ref 79–97)
Platelets: 243 10*3/uL (ref 150–450)
RBC: 4.36 x10E6/uL (ref 4.14–5.80)
RDW: 16.6 % — ABNORMAL HIGH (ref 11.6–15.4)
WBC: 7 10*3/uL (ref 3.4–10.8)

## 2023-01-19 LAB — T4, FREE: Free T4: 0.89 ng/dL (ref 0.82–1.77)

## 2023-01-19 LAB — TSH: TSH: 6.66 u[IU]/mL — ABNORMAL HIGH (ref 0.450–4.500)

## 2023-01-20 ENCOUNTER — Ambulatory Visit: Payer: Medicare Other | Admitting: Internal Medicine

## 2023-01-23 ENCOUNTER — Ambulatory Visit (INDEPENDENT_AMBULATORY_CARE_PROVIDER_SITE_OTHER): Payer: Medicare Other | Admitting: Internal Medicine

## 2023-01-23 VITALS — BP 142/80 | HR 79 | Temp 98.2°F | Ht 71.0 in | Wt 283.0 lb

## 2023-01-23 DIAGNOSIS — G320 Subacute combined degeneration of spinal cord in diseases classified elsewhere: Secondary | ICD-10-CM | POA: Diagnosis not present

## 2023-01-23 DIAGNOSIS — E538 Deficiency of other specified B group vitamins: Secondary | ICD-10-CM

## 2023-01-23 DIAGNOSIS — R972 Elevated prostate specific antigen [PSA]: Secondary | ICD-10-CM | POA: Diagnosis not present

## 2023-01-23 DIAGNOSIS — E118 Type 2 diabetes mellitus with unspecified complications: Secondary | ICD-10-CM

## 2023-01-23 DIAGNOSIS — D500 Iron deficiency anemia secondary to blood loss (chronic): Secondary | ICD-10-CM | POA: Diagnosis not present

## 2023-01-23 DIAGNOSIS — E559 Vitamin D deficiency, unspecified: Secondary | ICD-10-CM

## 2023-01-23 DIAGNOSIS — E1169 Type 2 diabetes mellitus with other specified complication: Secondary | ICD-10-CM | POA: Diagnosis not present

## 2023-01-23 DIAGNOSIS — E785 Hyperlipidemia, unspecified: Secondary | ICD-10-CM | POA: Diagnosis not present

## 2023-01-23 LAB — LIPID PANEL
Cholesterol: 232 mg/dL — ABNORMAL HIGH (ref 0–200)
HDL: 51.7 mg/dL (ref >39.00)
LDL Cholesterol: 160 mg/dL — ABNORMAL HIGH (ref 0–99)
NonHDL: 180.41
Total CHOL/HDL Ratio: 4
Triglycerides: 104 mg/dL (ref 0.0–149.0)
VLDL: 20.8 mg/dL (ref 0.0–40.0)

## 2023-01-23 LAB — VITAMIN B12: Vitamin B-12: 761 pg/mL (ref 211–911)

## 2023-01-23 LAB — HEMOGLOBIN A1C: Hgb A1c MFr Bld: 6.6 % — ABNORMAL HIGH (ref 4.6–6.5)

## 2023-01-23 LAB — VITAMIN D 25 HYDROXY (VIT D DEFICIENCY, FRACTURES): VITD: 20.13 ng/mL — ABNORMAL LOW (ref 30.00–100.00)

## 2023-01-23 NOTE — Progress Notes (Unsigned)
   Subjective:   Patient ID: Brian Myers, male    DOB: 30-Mar-1945, 77 y.o.   MRN: [id number]  HPI The patient is a 77 YO man coming in for follow up diabetes. Having cardioversion soon. Feels better on amiodarone.  Review of Systems  Constitutional: Negative.   HENT: Negative.    Eyes: Negative.   Respiratory:  Negative for cough, chest tightness and shortness of breath.   Cardiovascular:  Negative for chest pain, palpitations and leg swelling.  Gastrointestinal:  Negative for abdominal distention, abdominal pain, constipation, diarrhea, nausea and vomiting.  Musculoskeletal: Negative.   Skin: Negative.   Neurological: Negative.   Psychiatric/Behavioral: Negative.      Objective:  Physical Exam Constitutional:      Appearance: He is well-developed.  HENT:     Head: Normocephalic and atraumatic.  Cardiovascular:     Rate and Rhythm: Normal rate. Rhythm irregular.  Pulmonary:     Effort: Pulmonary effort is normal. No respiratory distress.     Breath sounds: Normal breath sounds. No wheezing or rales.  Abdominal:     General: Bowel sounds are normal. There is no distension.     Palpations: Abdomen is soft.     Tenderness: There is no abdominal tenderness. There is no rebound.  Musculoskeletal:     Cervical back: Normal range of motion.  Skin:    General: Skin is warm and dry.  Neurological:     Mental Status: He is alert and oriented to person, place, and time.     Coordination: Coordination normal.     Vitals:   01/23/23 1354 01/23/23 1405  BP: (!) 142/80 (!) 142/80  Pulse: 79   Temp: 98.2 F (36.8 C)   TempSrc: Oral   SpO2: 98%   Weight: 283 lb (128.4 kg)   Height: 5\' 11"  (1.803 m)     Assessment & Plan:

## 2023-01-23 NOTE — Patient Instructions (Signed)
We will check the labs today. 

## 2023-01-24 LAB — MICROALBUMIN / CREATININE URINE RATIO
Creatinine,U: 108.9 mg/dL
Microalb Creat Ratio: 1.6 mg/g (ref 0.0–30.0)
Microalb, Ur: 1.7 mg/dL (ref 0.0–1.9)

## 2023-01-24 NOTE — Assessment & Plan Note (Signed)
Recent CBC stable

## 2023-01-24 NOTE — Assessment & Plan Note (Signed)
Checking vitamin D level and adjust as needed. Taking otc currently.

## 2023-01-24 NOTE — Assessment & Plan Note (Signed)
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

## 2023-01-24 NOTE — Assessment & Plan Note (Signed)
Checking HgA1c and adjust as needed. He is taking ARB. Not on statin.

## 2023-01-24 NOTE — Assessment & Plan Note (Signed)
Checking B12 level and adjust as needed. Still having some numbness and weakness related to this.

## 2023-01-24 NOTE — Assessment & Plan Note (Signed)
Recently checked and stable with urology.

## 2023-01-27 NOTE — Progress Notes (Addendum)
Reminded pt to arrive at 0800am and instructed patient to stay NPO after midnight on Sunday, come to the [employer] and register, and to take his Pradaxa on  Monday morning.

## 2023-01-30 ENCOUNTER — Ambulatory Visit (HOSPITAL_COMMUNITY): Payer: Medicare Other | Admitting: Registered Nurse

## 2023-01-30 ENCOUNTER — Other Ambulatory Visit: Payer: Self-pay

## 2023-01-30 ENCOUNTER — Ambulatory Visit (HOSPITAL_COMMUNITY)
Admission: RE | Admit: 2023-01-30 | Discharge: 2023-01-30 | Disposition: A | Discharge status: Home / Self Care | Payer: Medicare Other | Source: Ambulatory Visit | Attending: Cardiovascular Disease | Admitting: Cardiovascular Disease

## 2023-01-30 ENCOUNTER — Encounter (HOSPITAL_COMMUNITY)
Admission: RE | Disposition: A | Discharge status: Home / Self Care | Payer: Self-pay | Source: Ambulatory Visit | Attending: Cardiovascular Disease

## 2023-01-30 DIAGNOSIS — I10 Essential (primary) hypertension: Secondary | ICD-10-CM | POA: Insufficient documentation

## 2023-01-30 DIAGNOSIS — Z01818 Encounter for other preprocedural examination: Secondary | ICD-10-CM

## 2023-01-30 DIAGNOSIS — Z7902 Long term (current) use of antithrombotics/antiplatelets: Secondary | ICD-10-CM | POA: Diagnosis not present

## 2023-01-30 DIAGNOSIS — K219 Gastro-esophageal reflux disease without esophagitis: Secondary | ICD-10-CM | POA: Diagnosis not present

## 2023-01-30 DIAGNOSIS — E785 Hyperlipidemia, unspecified: Secondary | ICD-10-CM | POA: Diagnosis not present

## 2023-01-30 DIAGNOSIS — I4891 Unspecified atrial fibrillation: Secondary | ICD-10-CM | POA: Diagnosis not present

## 2023-01-30 DIAGNOSIS — Z6839 Body mass index (BMI) 39.0-39.9, adult: Secondary | ICD-10-CM | POA: Insufficient documentation

## 2023-01-30 DIAGNOSIS — Z79899 Other long term (current) drug therapy: Secondary | ICD-10-CM | POA: Insufficient documentation

## 2023-01-30 DIAGNOSIS — I4819 Other persistent atrial fibrillation: Secondary | ICD-10-CM | POA: Insufficient documentation

## 2023-01-30 DIAGNOSIS — E119 Type 2 diabetes mellitus without complications: Secondary | ICD-10-CM | POA: Diagnosis not present

## 2023-01-30 DIAGNOSIS — G4733 Obstructive sleep apnea (adult) (pediatric): Secondary | ICD-10-CM | POA: Insufficient documentation

## 2023-01-30 HISTORY — PX: CARDIOVERSION: EP1203

## 2023-01-30 SURGERY — CARDIOVERSION (CATH LAB)
Anesthesia: General

## 2023-01-30 MED ORDER — LIDOCAINE 2% (20 MG/ML) 5 ML SYRINGE
INTRAMUSCULAR | Status: DC | PRN
  Administered 2023-01-30: 40 mg via INTRAVENOUS

## 2023-01-30 MED ORDER — PROPOFOL 10 MG/ML IV BOLUS
INTRAVENOUS | Status: DC | PRN
  Administered 2023-01-30: 20 mg via INTRAVENOUS
  Administered 2023-01-30: 30 mg via INTRAVENOUS
  Administered 2023-01-30: 20 mg via INTRAVENOUS
  Administered 2023-01-30: 60 mg via INTRAVENOUS

## 2023-01-30 MED ORDER — SODIUM CHLORIDE 0.9 % IV SOLN: INTRAVENOUS | Status: DC

## 2023-01-30 SURGICAL SUPPLY — 1 items: PAD DEFIB RADIO PHYSIO CONN (PAD) ×1 IMPLANT

## 2023-01-30 NOTE — Anesthesia Preprocedure Evaluation (Addendum)
Anesthesia Evaluation  Patient identified by MRN, date of birth, ID band Patient awake    Reviewed: Allergy & Precautions, NPO status , Patient's Chart, lab work & pertinent test results  Airway Mallampati: IV  TM Distance: >3 FB Neck ROM: Full    Dental  (+) Teeth Intact, Dental Advisory Given   Pulmonary asthma , sleep apnea and Continuous Positive Airway Pressure Ventilation    breath sounds clear to auscultation       Cardiovascular hypertension, + dysrhythmias Atrial Fibrillation  Rhythm:Regular Rate:Normal  Echo: 1. Left ventricular ejection fraction, by estimation, is 60 to 65%. The  left ventricle has normal function. The left ventricle has no regional  wall motion abnormalities. There is mild asymmetric left ventricular  hypertrophy of the basal-septal segment.  Left ventricular diastolic parameters are indeterminate.   2. Right ventricular systolic function is normal. The right ventricular  size is normal.   3. Left atrial size was mildly dilated.   4. Right atrial size was mildly dilated.   5. The mitral valve is grossly normal. Trivial mitral valve  regurgitation.   6. The aortic valve is tricuspid. There is mild calcification of the  aortic valve. There is mild thickening of the aortic valve. Aortic valve  regurgitation is not visualized. Aortic valve sclerosis/calcification is  present, without any evidence of  aortic stenosis.   7. Aortic dilatation noted. There is mild dilatation of the aortic root,  measuring 41 mm.     Neuro/Psych  Neuromuscular disease  negative psych ROS   GI/Hepatic Neg liver ROS, hiatal hernia,GERD  ,,  Endo/Other  diabetes    Renal/GU negative Renal ROS     Musculoskeletal  (+) Arthritis ,    Abdominal   Peds  Hematology  (+) Blood dyscrasia, anemia   Anesthesia Other Findings   Reproductive/Obstetrics                             Anesthesia  Physical Anesthesia Plan  ASA: 3  Anesthesia Plan: General   Post-op Pain Management: Minimal or no pain anticipated   Induction: Intravenous  PONV Risk Score and Plan: 0 and Propofol infusion  Airway Management Planned: Natural Airway and Simple Face Mask  Additional Equipment: None  Intra-op Plan:   Post-operative Plan:   Informed Consent: I have reviewed the patients History and Physical, chart, labs and discussed the procedure including the risks, benefits and alternatives for the proposed anesthesia with the patient or authorized representative who has indicated his/her understanding and acceptance.       Plan Discussed with: CRNA  Anesthesia Plan Comments:        Anesthesia Quick Evaluation

## 2023-01-30 NOTE — Interval H&P Note (Signed)
History and Physical Interval Note:  01/30/2023 8:31 AM  Brian Myers  has presented today for surgery, with the diagnosis of AFIB.  The various methods of treatment have been discussed with the patient and family. After consideration of risks, benefits and other options for treatment, the patient has consented to  Procedure(s): CARDIOVERSION (N/A) as a surgical intervention.  The patient's history has been reviewed, patient examined, no change in status, stable for surgery.  I have reviewed the patient's chart and labs.  Questions were answered to the patient's satisfaction.     [name]

## 2023-01-30 NOTE — Transfer of Care (Signed)
Immediate Anesthesia Transfer of Care Note  Patient: Brian Myers  Procedure(s) Performed: CARDIOVERSION  Patient Location: Cath Lab  Anesthesia Type:General  Level of Consciousness: awake, alert , and oriented  Airway & Oxygen Therapy: Patient Spontanous Breathing and Patient connected to nasal cannula oxygen  Post-op Assessment: Report given to RN and Post -op Vital signs reviewed and stable  Post vital signs: Reviewed and stable  Last Vitals:  Vitals Value Taken Time  BP    Temp    Pulse 85 01/30/23 0836  Resp 18 01/30/23 0836  SpO2 98 % 01/30/23 0836  Vitals shown include unfiled device data.  Last Pain: There were no vitals filed for this visit.       Complications: There were no known notable events for this encounter.

## 2023-01-30 NOTE — Anesthesia Postprocedure Evaluation (Signed)
Anesthesia Post Note  Patient: Brian Myers  Procedure(s) Performed: CARDIOVERSION     Patient location during evaluation: PACU Anesthesia Type: General Level of consciousness: awake and alert Pain management: pain level controlled Vital Signs Assessment: post-procedure vital signs reviewed and stable Respiratory status: spontaneous breathing, nonlabored ventilation, respiratory function stable and patient connected to nasal cannula oxygen Cardiovascular status: blood pressure returned to baseline and stable Postop Assessment: no apparent nausea or vomiting Anesthetic complications: no  There were no known notable events for this encounter.  Last Vitals:  Vitals:   01/30/23 0905 01/30/23 0910  BP: 127/89 123/87  Pulse: 66 68  Resp: 12 18  Temp:    SpO2: 94% 94%    Last Pain:  Vitals:   01/30/23 0910  TempSrc:   PainSc: 0-No pain                 Shelton Silvas

## 2023-01-30 NOTE — Op Note (Signed)
Procedure: Electrical Cardioversion Indications:  Atrial Fibrillation  Procedure Details:  Consent: Risks of procedure as well as the alternatives and risks of each were explained to the (patient/caregiver).  Consent for procedure obtained.  Time Out: Verified patient identification, verified procedure, site/side was marked, verified correct patient position, special equipment/implants available, medications/allergies/relevent history reviewed, required imaging and test results available.  Performed  Patient placed on cardiac monitor, pulse oximetry, supplemental oxygen as necessary.  Sedation given:  propofol 130 mg IV, Dr. Hart [place] Pacer pads placed anterior and posterior chest.  Cardioverted 1 time(s).  Cardioversion with synchronized biphasic 250J shock.  Evaluation: Findings: Post procedure EKG shows: NSR Complications: None Patient did tolerate procedure well.  Time Spent Directly with the Patient:  30 minutes   Brian Myers 01/30/2023, 8:49 AM

## 2023-01-31 ENCOUNTER — Encounter (HOSPITAL_COMMUNITY): Payer: Self-pay | Admitting: Cardiovascular Disease

## 2023-02-13 NOTE — Progress Notes (Signed)
  Electrophysiology Office Note:   Date:  02/14/2023  ID:  Brian Myers, DOB 15-Jul-1945, MRN [id number]  Primary Cardiologist: Lesleigh Noe, MD (Inactive) Electrophysiologist: Lanier Prude, MD      History of Present Illness:   Brian Myers is a 77 y.o. male with h/o OSA w/CPAP, HLD, DM, PAF, and morbid obesity  seen today for routine electrophysiology followup.   Previously, seen 09/08/2022 for persistent AF. Started on amiodarone.    DCCV was performed on 11/11/2022 successfully. He stayed in sinus for several days before returning to AF.   He felt much better when he was in rhythm. Planned for an additional cardioversion on amiodarone as bridge to ablation.   Underwent DCC again 12/2.   Since last being seen in our clinic the patient reports doing OK. He felt GREAT for the approximately one week that he maintained sinus. He is having issues with his CPAP machine making noise after the pressure was turned up. Not specifically trying to lose weight, but is going to start monitoring his calorie intake.   Review of systems complete and found to be negative unless listed in HPI.   EP Information / Studies Reviewed:    EKG is ordered today. Personal review as below.  EKG Interpretation Date/Time:  Tuesday February 14 2023 11:07:09 EST Ventricular Rate:  69 PR Interval:    QRS Duration:  90 QT Interval:  408 QTC Calculation: 437 R Axis:   84  Text Interpretation: Atrial fibrillation When compared with ECG of 30-Jan-2023 08:54, Atrial fibrillation has replaced Sinus rhythm Confirmed by Maxine Glenn [phone]) on 02/14/2023 11:13:15 AM    Echo 07/04/2022 LVEF 60-65%, normal RV, mild LAE, mild RAE, Aortic dilation of 41 mm.  Physical Exam:   VS:  BP (!) 140/84   Pulse 69   Ht 5\' 11"  (1.803 m)   Wt 284 lb 12.8 oz (129.2 kg)   SpO2 97%   BMI 39.72 kg/m     Wt Readings from Last 3 Encounters:  02/14/23 284 lb 12.8 oz (129.2 kg)  01/23/23 283 lb (128.4 kg)   01/18/23 282 lb 12.8 oz (128.3 kg)     GEN: Well nourished, well developed in no acute distress NECK: No JVD; No carotid bruits CARDIAC: Irregularly irregular rate and rhythm, no murmurs, rubs, gallops RESPIRATORY:  Clear to auscultation without rales, wheezing or rhonchi  ABDOMEN: Soft, non-tender, non-distended EXTREMITIES:  No edema; No deformity   ASSESSMENT AND PLAN:    Persistent AF High risk drug monitoring - amiodarone EKG today shows AF with HR 69 bpm. Continue pradaxa 150 mg BID  Continue amiodarone 200 mg daily Scheduled for ablation in February Surveillance labs [facility] 01/18/2023.  HTN Stable on current regimen   Secondary hypercoagulable state Pt on  Pradaxa  as above   Obesity Body mass index is 39.72 kg/m.  Encouraged lifestyle modification  He verbalizes understanding that weight loss and management of his OSA will be necessary for maintenance of NSR.   Follow up with EP APP 2 months for pre ablation visit. Sooner with issues.   Signed, Graciella Freer, PA-C

## 2023-02-14 ENCOUNTER — Ambulatory Visit: Payer: Medicare Other | Attending: Student | Admitting: Student

## 2023-02-14 ENCOUNTER — Encounter: Payer: Self-pay | Admitting: Student

## 2023-02-14 ENCOUNTER — Other Ambulatory Visit: Payer: Self-pay

## 2023-02-14 VITALS — BP 140/84 | HR 69 | Ht 71.0 in | Wt 284.8 lb

## 2023-02-14 DIAGNOSIS — Z01818 Encounter for other preprocedural examination: Secondary | ICD-10-CM | POA: Diagnosis not present

## 2023-02-14 DIAGNOSIS — I4819 Other persistent atrial fibrillation: Secondary | ICD-10-CM

## 2023-02-14 DIAGNOSIS — I10 Essential (primary) hypertension: Secondary | ICD-10-CM | POA: Insufficient documentation

## 2023-02-14 NOTE — Patient Instructions (Signed)
 Medication Instructions:  Your physician recommends that you continue on your current medications as directed. Please refer to the Current Medication list given to you today.  *If you need a refill on your cardiac medications before your next appointment, please call your pharmacy*  Lab Work: None ordered If you have labs (blood work) drawn today and your tests are completely normal, you will receive your results only by: MyChart Message (if you have MyChart) OR A paper copy in the mail If you have any lab test that is abnormal or we need to change your treatment, we will call you to review the results.  Follow-Up: At [facility], you and your health needs are our priority.  As part of our continuing mission to provide you with exceptional heart care, we have created designated Provider Care Teams.  These Care Teams include your primary Cardiologist (physician) and Advanced Practice Providers (APPs -  Physician Assistants and Nurse Practitioners) who all work together to provide you with the care you need, when you need it.  Your next appointment:   As scheduled

## 2023-02-18 IMAGING — DX DG CHEST 2V
2 series · 2 of 2 positions shown · non-contrast
Comparison: March 20, 2013

CLINICAL DATA: Cough for 6 weeks.

EXAM:
CHEST - 2 VIEW

[chest pa]
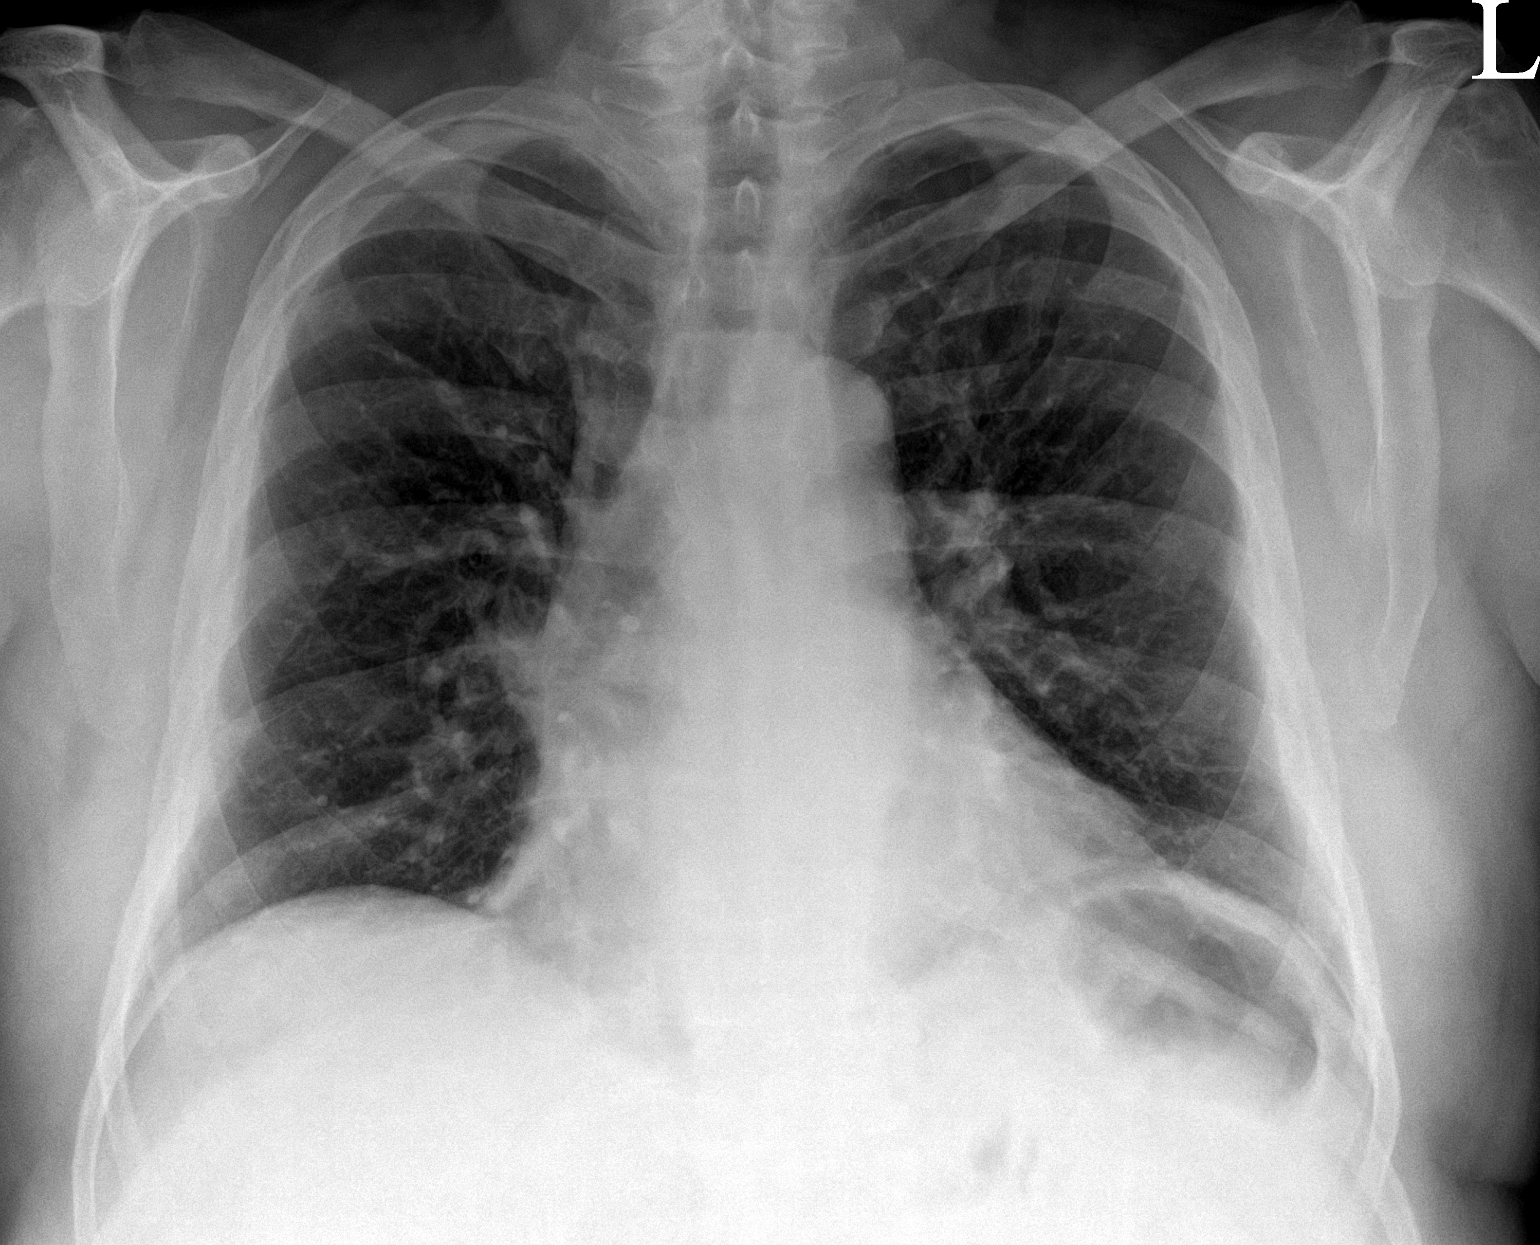

[chest lat]
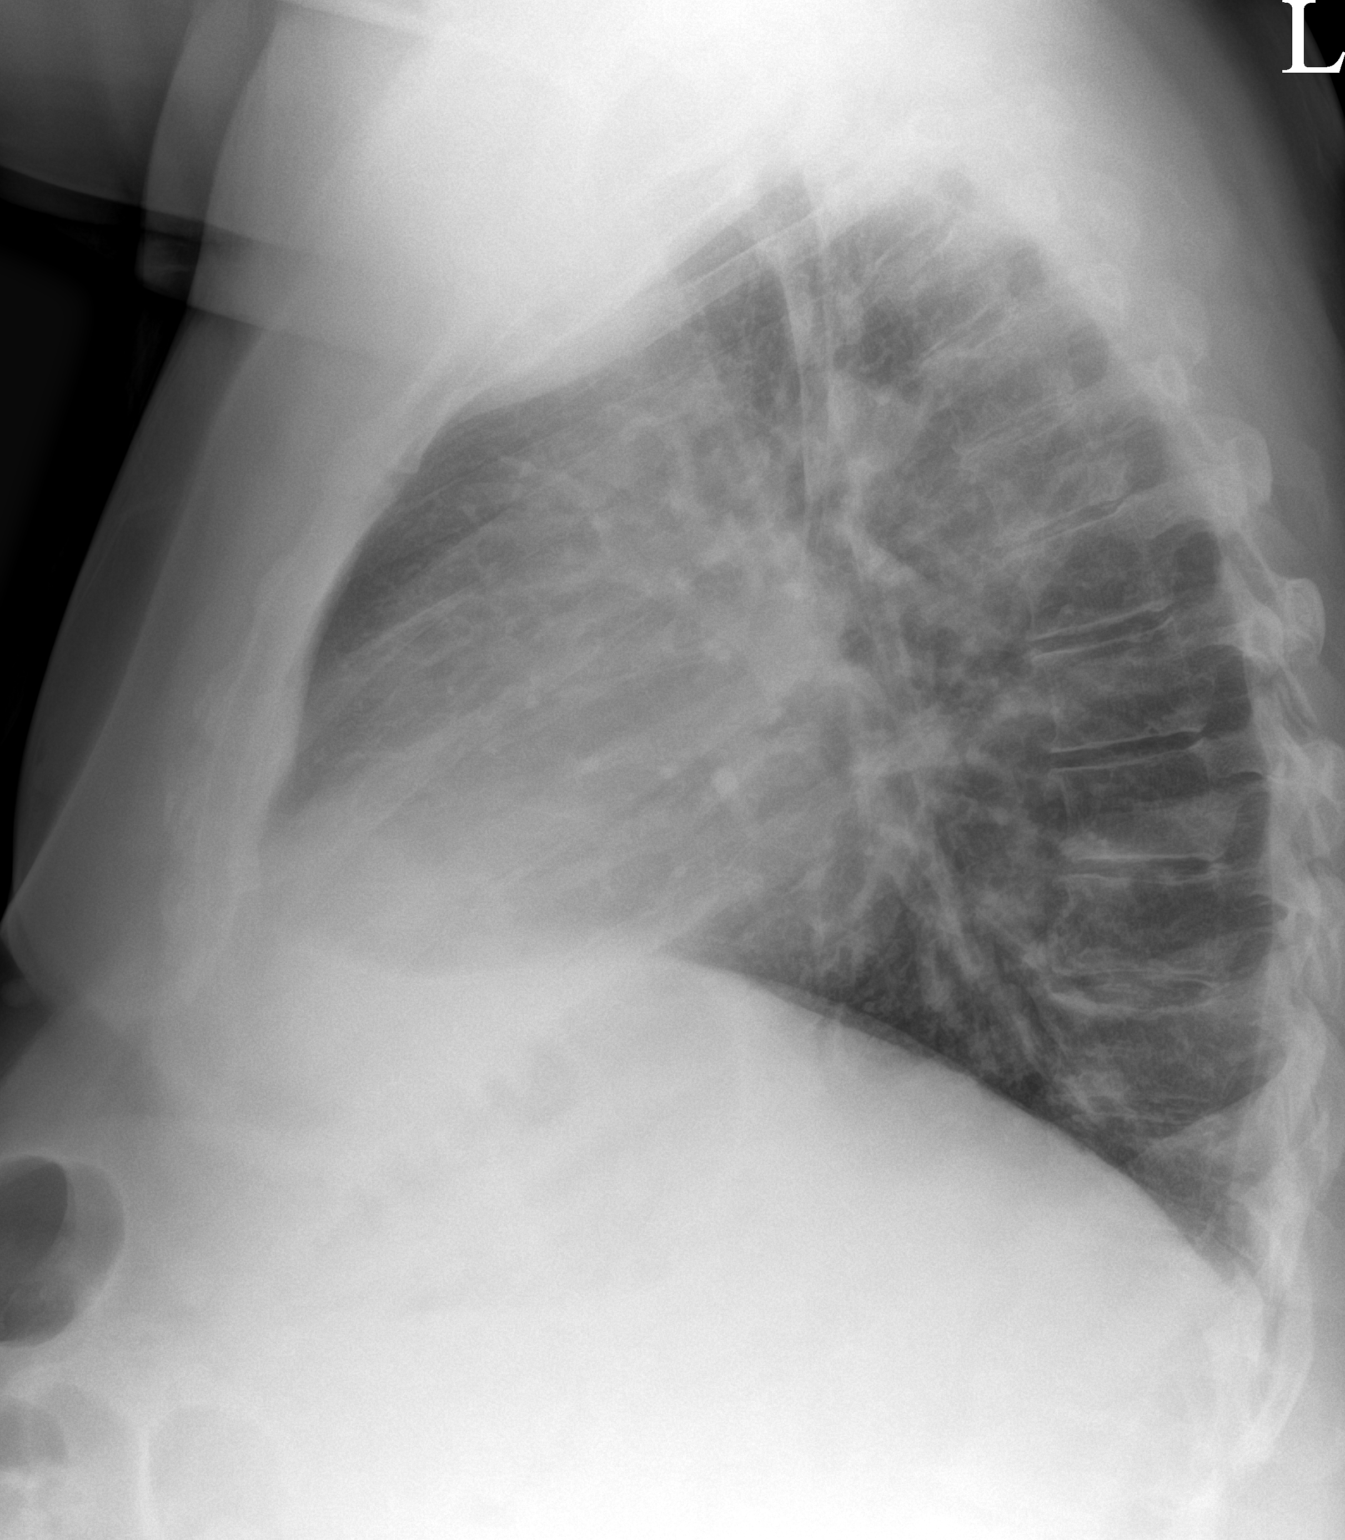

[2 of 2 positions shown; findings below may reference images not displayed]

FINDINGS: The cardiomediastinal silhouette is stable. The heart size remains
borderline to mildly enlarged. No pneumothorax. No nodules or
masses. No focal infiltrates. No overt edema.
IMPRESSION: No active cardiopulmonary disease.

## 2023-03-08 ENCOUNTER — Other Ambulatory Visit (HOSPITAL_COMMUNITY): Payer: Medicare Other

## 2023-04-05 DIAGNOSIS — I4819 Other persistent atrial fibrillation: Secondary | ICD-10-CM | POA: Diagnosis not present

## 2023-04-06 LAB — CBC
Hematocrit: 35.6 % — ABNORMAL LOW (ref 37.5–51.0)
Hemoglobin: 11.4 g/dL — ABNORMAL LOW (ref 13.0–17.7)
MCH: 28.1 pg (ref 26.6–33.0)
MCHC: 32 g/dL (ref 31.5–35.7)
MCV: 88 fL (ref 79–97)
Platelets: 238 10*3/uL (ref 150–450)
RBC: 4.05 x10E6/uL — ABNORMAL LOW (ref 4.14–5.80)
RDW: 14.5 % (ref 11.6–15.4)
WBC: 6.6 10*3/uL (ref 3.4–10.8)

## 2023-04-06 LAB — BASIC METABOLIC PANEL
BUN/Creatinine Ratio: 19 (ref 10–24)
BUN: 19 mg/dL (ref 8–27)
CO2: 26 mmol/L (ref 20–29)
Calcium: 8.9 mg/dL (ref 8.6–10.2)
Chloride: 104 mmol/L (ref 96–106)
Creatinine, Ser: 0.99 mg/dL (ref 0.76–1.27)
Glucose: 128 mg/dL — ABNORMAL HIGH (ref 70–99)
Potassium: 4 mmol/L (ref 3.5–5.2)
Sodium: 143 mmol/L (ref 134–144)
eGFR: 79 mL/min/{1.73_m2} (ref >59)

## 2023-04-07 ENCOUNTER — Ambulatory Visit (HOSPITAL_COMMUNITY)
Admission: RE | Admit: 2023-04-07 | Discharge: 2023-04-07 | Disposition: A | Discharge status: Home / Self Care | Payer: Medicare Other | Source: Ambulatory Visit | Attending: Cardiology

## 2023-04-07 DIAGNOSIS — I10 Essential (primary) hypertension: Secondary | ICD-10-CM | POA: Diagnosis not present

## 2023-04-07 DIAGNOSIS — I4819 Other persistent atrial fibrillation: Secondary | ICD-10-CM | POA: Diagnosis not present

## 2023-04-07 DIAGNOSIS — Z79899 Other long term (current) drug therapy: Secondary | ICD-10-CM | POA: Insufficient documentation

## 2023-04-07 MED ORDER — IOHEXOL 350 MG/ML SOLN
100.0000 mL | Freq: Once | INTRAVENOUS | Status: AC | PRN
  Administered 2023-04-07: 100 mL via INTRAVENOUS

## 2023-04-09 NOTE — Progress Notes (Deleted)
  Electrophysiology Office Note:   Date:  04/09/2023  ID:  Brian Myers, DOB 10-29-1945, MRN [id number]  Primary Cardiologist: Lesleigh Noe, MD (Inactive) Primary Heart Failure: None Electrophysiologist: Lanier Prude, MD  {Click to update primary MD,subspecialty MD or APP then REFRESH:1}    History of Present Illness:   Brian Myers is a 78 y.o. male with h/o AF, HTN, OSA on CPAP, GERD, DM II seen today for routine electrophysiology followup - pre-ablation visit.   Since last being seen in our clinic the patient reports doing ***.  he denies chest pain, palpitations, dyspnea, PND, orthopnea, nausea, vomiting, dizziness, syncope, edema, weight gain, or early satiety.   Review of systems complete and found to be negative unless listed in HPI.   EP Information / Studies Reviewed:    EKG is not ordered today. EKG from 02/14/23 reviewed which showed AF, 69 bpm      Studies:  Cardiac Monitor 06/2022 > persistent AF ECHO 06/2022 > LVEF 60-65%, LA mildly dilated, RA mildly dilated    Arrhythmia / AAD AF  Amiodarone > initiated 08/2022 DCCV 11/11/22 > SR for several days, then ERAF DCCV 01/30/23   Risk Assessment/Calculations:    CHA2DS2-VASc Score = 4  {Confirm score is correct.  If not, click here to update score.  REFRESH note.  :1} This indicates a 4.8% annual risk of stroke. The patient's score is based upon: CHF History: 0 HTN History: 1 Diabetes History: 1 Stroke History: 0 Vascular Disease History: 0 Age Score: 2 Gender Score: 0   {This patient has a significant risk of stroke if diagnosed with atrial fibrillation.  Please consider VKA or DOAC agent for anticoagulation if the bleeding risk is acceptable.   You can also use the SmartPhrase .[email or web address] for documentation.   :[id number]} No BP recorded.  {Refresh Note OR Click here to enter BP  :1}***        Physical Exam:   VS:  There were no vitals taken for this visit.   Wt Readings from Last 3  Encounters:  02/14/23 284 lb 12.8 oz (129.2 kg)  01/23/23 283 lb (128.4 kg)  01/18/23 282 lb 12.8 oz (128.3 kg)     GEN: Well nourished, well developed in no acute distress NECK: No JVD; No carotid bruits CARDIAC: {[id number]}, no murmurs, rubs, gallops RESPIRATORY:  Clear to auscultation without rales, wheezing or rhonchi  ABDOMEN: Soft, non-tender, non-distended EXTREMITIES:  No edema; No deformity   ASSESSMENT AND PLAN:    Persistent AF  High Risk Drug Monitoring: Amiodarone  CHA2DS2-VASc 4 -pending AF ablation on 04/28/23  -cardiac CT completed 04/07/23 *** -no missed doses of OAC***  -continue amiodarone as bridge to ablation  -pre-procedure labs > CBC, BMP completed on 04/05/23 *** -procedure reviewed and instructions provided  Secondary Hypercoagulable State  -continue Pradaxa, Cr Cl 137mL/min based on last labs -reviewed importance of no missed doses of OAC  Hypertension  -well controlled on current regimen ***  OSA  -CPAP compliance encouraged   Obesity  -slow gradual weight loss encouraged    Follow up with Dr. Lalla Brothers  as planned for AF ablation   Signed, Brian Brim, NP-C, AGACNP-BC [facility] - Electrophysiology  04/09/2023, 8:00 PM

## 2023-04-11 ENCOUNTER — Ambulatory Visit: Payer: Medicare Other | Admitting: Pulmonary Disease

## 2023-04-11 DIAGNOSIS — G4733 Obstructive sleep apnea (adult) (pediatric): Secondary | ICD-10-CM

## 2023-04-11 DIAGNOSIS — D6869 Other thrombophilia: Secondary | ICD-10-CM

## 2023-04-11 DIAGNOSIS — I10 Essential (primary) hypertension: Secondary | ICD-10-CM

## 2023-04-11 DIAGNOSIS — I4819 Other persistent atrial fibrillation: Secondary | ICD-10-CM

## 2023-04-12 ENCOUNTER — Ambulatory Visit: Payer: Medicare Other | Admitting: Student

## 2023-04-13 ENCOUNTER — Ambulatory Visit: Payer: Medicare Other | Admitting: Student

## 2023-04-13 ENCOUNTER — Ambulatory Visit: Payer: Medicare Other | Admitting: Pulmonary Disease

## 2023-04-15 ENCOUNTER — Other Ambulatory Visit: Payer: Self-pay

## 2023-04-15 ENCOUNTER — Emergency Department (HOSPITAL_COMMUNITY): Payer: Medicare Other

## 2023-04-15 ENCOUNTER — Ambulatory Visit
Admission: RE | Admit: 2023-04-15 | Discharge: 2023-04-15 | Disposition: A | Discharge status: Home / Self Care | Payer: Medicare Other | Source: Ambulatory Visit | Attending: Internal Medicine

## 2023-04-15 ENCOUNTER — Encounter (HOSPITAL_COMMUNITY): Payer: Self-pay

## 2023-04-15 ENCOUNTER — Emergency Department (HOSPITAL_COMMUNITY)
Admission: EM | Admit: 2023-04-15 | Discharge: 2023-04-15 | Disposition: A | Discharge status: Home / Self Care | Payer: Medicare Other | Attending: Emergency Medicine | Admitting: Emergency Medicine

## 2023-04-15 VITALS — BP 135/90 | HR 69 | Temp 98.3°F | Resp 20

## 2023-04-15 DIAGNOSIS — R609 Edema, unspecified: Secondary | ICD-10-CM

## 2023-04-15 DIAGNOSIS — Z7951 Long term (current) use of inhaled steroids: Secondary | ICD-10-CM | POA: Insufficient documentation

## 2023-04-15 DIAGNOSIS — R0602 Shortness of breath: Secondary | ICD-10-CM

## 2023-04-15 DIAGNOSIS — J209 Acute bronchitis, unspecified: Secondary | ICD-10-CM | POA: Diagnosis not present

## 2023-04-15 DIAGNOSIS — J45909 Unspecified asthma, uncomplicated: Secondary | ICD-10-CM | POA: Diagnosis not present

## 2023-04-15 DIAGNOSIS — Z7901 Long term (current) use of anticoagulants: Secondary | ICD-10-CM | POA: Diagnosis not present

## 2023-04-15 DIAGNOSIS — R0981 Nasal congestion: Secondary | ICD-10-CM | POA: Insufficient documentation

## 2023-04-15 DIAGNOSIS — R600 Localized edema: Secondary | ICD-10-CM | POA: Diagnosis not present

## 2023-04-15 DIAGNOSIS — R059 Cough, unspecified: Secondary | ICD-10-CM | POA: Diagnosis not present

## 2023-04-15 DIAGNOSIS — R051 Acute cough: Secondary | ICD-10-CM | POA: Diagnosis not present

## 2023-04-15 DIAGNOSIS — J449 Chronic obstructive pulmonary disease, unspecified: Secondary | ICD-10-CM | POA: Diagnosis not present

## 2023-04-15 LAB — POC COVID19/FLU A&B COMBO
Covid Antigen, POC: NEGATIVE
Influenza A Antigen, POC: NEGATIVE
Influenza B Antigen, POC: NEGATIVE

## 2023-04-15 LAB — COMPREHENSIVE METABOLIC PANEL
ALT: 35 U/L (ref 0–44)
AST: 42 U/L — ABNORMAL HIGH (ref 15–41)
Albumin: 3.5 g/dL (ref 3.5–5.0)
Alkaline Phosphatase: 41 U/L (ref 38–126)
Anion gap: 13 (ref 5–15)
BUN: 15 mg/dL (ref 8–23)
CO2: 24 mmol/L (ref 22–32)
Calcium: 8.9 mg/dL (ref 8.9–10.3)
Chloride: 100 mmol/L (ref 98–111)
Creatinine, Ser: 1.02 mg/dL (ref 0.61–1.24)
GFR, Estimated: >60 mL/min (ref >60)
Glucose, Bld: 116 mg/dL — ABNORMAL HIGH (ref 70–99)
Potassium: 3.4 mmol/L — ABNORMAL LOW (ref 3.5–5.1)
Sodium: 137 mmol/L (ref 135–145)
Total Bilirubin: 0.8 mg/dL (ref 0.0–1.2)
Total Protein: 7.3 g/dL (ref 6.5–8.1)

## 2023-04-15 LAB — CBC
HCT: 38 % — ABNORMAL LOW (ref 39.0–52.0)
Hemoglobin: 11.9 g/dL — ABNORMAL LOW (ref 13.0–17.0)
MCH: 27.6 pg (ref 26.0–34.0)
MCHC: 31.3 g/dL (ref 30.0–36.0)
MCV: 88.2 fL (ref 80.0–100.0)
Platelets: 237 10*3/uL (ref 150–400)
RBC: 4.31 MIL/uL (ref 4.22–5.81)
RDW: 15.6 % — ABNORMAL HIGH (ref 11.5–15.5)
WBC: 6.1 10*3/uL (ref 4.0–10.5)
nRBC: 0 % (ref 0.0–0.2)

## 2023-04-15 LAB — TROPONIN I (HIGH SENSITIVITY): Troponin I (High Sensitivity): 6 ng/L (ref <18)

## 2023-04-15 MED ORDER — PREDNISONE 20 MG PO TABS: 40.0000 mg | ORAL_TABLET | Freq: Every day | ORAL | 0 refills | Status: DC

## 2023-04-15 MED ORDER — DOXYCYCLINE HYCLATE 100 MG PO CAPS: 100.0000 mg | ORAL_CAPSULE | Freq: Two times a day (BID) | ORAL | 0 refills | Status: DC

## 2023-04-15 MED ORDER — ALBUTEROL SULFATE (2.5 MG/3ML) 0.083% IN NEBU: 2.5000 mg | INHALATION_SOLUTION | Freq: Four times a day (QID) | RESPIRATORY_TRACT | 12 refills | Status: DC | PRN

## 2023-04-15 MED ORDER — IPRATROPIUM-ALBUTEROL 0.5-2.5 (3) MG/3ML IN SOLN
3.0000 mL | Freq: Once | RESPIRATORY_TRACT | Status: AC
  Administered 2023-04-15: 3 mL via RESPIRATORY_TRACT

## 2023-04-15 MED ORDER — ALBUTEROL SULFATE HFA 108 (90 BASE) MCG/ACT IN AERS
2.0000 | INHALATION_SPRAY | RESPIRATORY_TRACT | 3 refills | Status: AC | PRN
Start: 2023-04-15 — End: ?

## 2023-04-15 MED ORDER — PROMETHAZINE-DM 6.25-15 MG/5ML PO SYRP
5.0000 mL | ORAL_SOLUTION | Freq: Four times a day (QID) | ORAL | 0 refills | Status: DC | PRN
Start: 2023-04-15 — End: 2023-05-29

## 2023-04-15 MED ORDER — DEXAMETHASONE SODIUM PHOSPHATE 10 MG/ML IJ SOLN
10.0000 mg | Freq: Once | INTRAMUSCULAR | Status: AC
  Administered 2023-04-15: 10 mg via INTRAMUSCULAR

## 2023-04-15 NOTE — ED Triage Notes (Addendum)
Pt c.o cough, sob and wheezing x 4 days. Negative covid/flu at [facility] today.

## 2023-04-15 NOTE — ED Provider Notes (Signed)
[facility]    CSN: [id number] Arrival date & time: 04/15/23  1147      History   Chief Complaint Chief Complaint  Patient presents with   Cough    Bad cough with wheezing and shortness of breath - Entered by patient    HPI Brian Myers is a 78 y.o. male.   Brian Myers is a 78 y.o. male presenting for chief complaint of cough that started 4 days ago and shortness of breath/wheezing that worsened yesterday.  Cough is dry, persistent, and nonproductive/worse at nighttime.  Symptoms worsened significantly yesterday and he reports intermittent shortness of breath and bilateral chest tightness associated with coughing.  No recent sick contacts with similar symptoms.  History of atrial fibrillation, takes Pradaxa and amiodarone without missed doses.  Additionally reports history of asthma/bronchitis.  He has had approximately 3 episodes of bronchitis in the last 1 year and states since using CPAP exacerbations of bronchitis have reduced significantly.  He has leg swelling at baseline and states it is no worse or better than normal.  Denies nausea, vomiting, diarrhea, abdominal pain, fever, chills, dizziness, syncope, palpitations, and new orthopnea.  He has been using albuterol rescue inhaler without relief.  Additionally uses Symbicort daily for maintenance.  Of note, he checked his oxygen saturation at home last night and it was 90%.  He has never been a smoker.  His oxygen saturation is typically around 96 to 97%.  Currently 93% on room air in clinic.   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   Cough 10/10/2022   Wheezing 10/10/2022   Encounter for general adult medical examination with abnormal findings 07/15/2022   Vitamin D deficiency 01/07/2022   Benign prostatic hyperplasia 04/07/2021   Chronic sinusitis 04/07/2021   History of colonic polyps 04/07/2021   Statin myopathy 01/06/2021   Iron deficiency anemia due to chronic blood loss 01/05/2021   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/04/2021   Hyperlipidemia associated with type 2 diabetes mellitus (HCC) 01/04/2021   Mild intermittent asthma without complication 01/04/2021   PSA elevation 01/04/2021   Persistent atrial fibrillation (HCC)    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   Spinal stenosis of lumbar region 12/08/2017   PLMD (periodic limb movement disorder) 09/28/2015   Insomnia 09/28/2015   Essential hypertension 07/15/2013   OSA on CPAP 11/10/2010   Gastro-esophageal reflux disease without esophagitis 10/18/1995    Past Surgical History:  Procedure Laterality Date   CARDIOVERSION N/A 06/10/2020   Procedure: CARDIOVERSION;  Surgeon: Parke Poisson, MD;  Location: [facility] ENDOSCOPY;  Service: Cardiovascular;  Laterality: N/A;   CARDIOVERSION N/A 11/11/2022   Procedure: CARDIOVERSION;  Surgeon: Thurmon Fair, MD;  Location: [facility] INVASIVE CV LAB;  Service: Cardiovascular;  Laterality: N/A;   CARDIOVERSION N/A 01/30/2023   Procedure: CARDIOVERSION;  Surgeon: Thurmon Fair, MD;  Location: [facility] INVASIVE CV LAB;  Service: Cardiovascular;  Laterality: N/A;   CERVICAL FUSION  1991   KNEE ARTHROSCOPY Right 04/17/2013   Procedure: ARTHROSCOPY RIGHT KNEE WITH MEDIAL AND LATERAL DEBRIDEMENT AND CONDRAPLASTY;  Surgeon: Loanne Drilling, MD;  Location: [facility]  ORS;  Service: Orthopedics;  Laterality: Right;   LEFT KNEE ARTHROSCPY  2009   TEE WITHOUT CARDIOVERSION N/A 06/10/2020   Procedure: TRANSESOPHAGEAL ECHOCARDIOGRAM (TEE);  Surgeon: Parke Poisson, MD;  Location: [facility] ENDOSCOPY;  Service: Cardiovascular;  Laterality: N/A;       Home Medications    Prior to Admission medications   Medication Sig Start Date End Date Taking? Authorizing Provider  albuterol (VENTOLIN HFA) 108 (90 Base) MCG/ACT inhaler Inhale 1 puff into the lungs every 4 (four) hours as needed for wheezing or shortness of breath. 05/26/22   Myrlene Broker, MD  amiodarone (PACERONE) 200 MG tablet Take 1 tablet (200 mg total) by mouth in the morning. 11/28/22   Lanier Prude, MD  Cholecalciferol (VITAMIN D-3) 125 MCG (5000 UT) TABS Take 5,000 Units by mouth daily at 6 (six) AM.    [provider]  cyanocobalamin (VITAMIN B12) 1000 MCG tablet Take 1,000 mcg by mouth in the morning.    [provider]  dabigatran (PRADAXA) 150 MG CAPS capsule Take 1 capsule (150 mg total) by mouth every 12 (twelve) hours. Overdue for follow-up, MUST see MD for FUTURE refills. 12/12/22   Lanier Prude, MD  diltiazem (CARDIZEM CD) 360 MG 24 hr capsule TAKE 1 CAPSULE BY MOUTH EVERY DAY 01/20/23   Lanier Prude, MD  esomeprazole (NEXIUM) 20 MG capsule Take 20 mg by mouth 2 (two) times daily. 10/25/15   [provider]  Ferrous Sulfate (IRON PO) Take 130 mg by mouth every other day. In the morning.    [provider]  finasteride (PROSCAR) 5 MG tablet Take 5 mg by mouth in the morning.    [provider]  fluticasone (FLONASE) 50 MCG/ACT nasal spray Place 1 spray into both nostrils 2 (two) times a day.    [provider]  losartan (COZAAR) 50 MG tablet TAKE 1 TABLET BY MOUTH EVERY DAY 01/20/23   Lanier Prude, MD  methylcellulose (ARTIFICIAL TEARS) 1 % ophthalmic solution Place 1 drop into both eyes 2 (two) times daily.     [provider]  SYMBICORT 160-4.5 MCG/ACT inhaler TAKE 2 PUFFS BY MOUTH TWICE A DAY 01/19/23   Myrlene Broker, MD    Family History Family History  Problem Relation Age of Onset   Arrhythmia Mother        atrial fib   Mitral valve prolapse Mother    Hypertension Father     Social History Social History   Tobacco Use   Smoking status: Never   Smokeless tobacco: Never  Vaping Use   Vaping status: Never Used  Substance Use Topics   Alcohol use: Yes    Alcohol/week: 28.0 standard drinks of alcohol    Types: 28 Glasses of wine per week    Comment: 4 glasses of wine per day   Drug use: No     Allergies   Penicillins, Sulfa drugs cross reactors, and Statins   Review of Systems Review of Systems  Respiratory:  Positive for cough.  Per HPI   Physical Exam Triage Vital Signs ED Triage Vitals [04/15/23 1154]  Encounter Vitals Group     BP (!) 135/90     Systolic BP Percentile      Diastolic BP Percentile      Pulse Rate 69     Resp 20     Temp 98.3 F (36.8 C)     Temp Source Oral     SpO2 93 %     Weight      Height      Head Circumference      Peak Flow      Pain Score 0     Pain Loc      Pain Education      Exclude from Growth Chart    No data found.  Updated Vital Signs BP (!) 135/90 (BP Location: Right Arm)   Pulse 69   Temp 98.3 F (36.8 C) (Oral)   Resp 20   SpO2 (S) 92% Comment: post neb treatment  Visual Acuity Right Eye Distance:   Left Eye Distance:   Bilateral Distance:    Right Eye Near:   Left Eye Near:    Bilateral Near:     Physical Exam Vitals and nursing note reviewed.  Constitutional:      Appearance: He is not ill-appearing or toxic-appearing.  HENT:     Head: Normocephalic and atraumatic.     Right Ear: Hearing, tympanic membrane, ear canal and external ear normal.     Left Ear: Hearing, tympanic membrane, ear canal and external ear normal.     Nose: Nose normal.     Mouth/Throat:     Lips: Pink.      Mouth: Mucous membranes are moist. No injury or oral lesions.     Dentition: Normal dentition.     Tongue: No lesions.     Pharynx: Oropharynx is clear. Uvula midline. No pharyngeal swelling, oropharyngeal exudate, posterior oropharyngeal erythema, uvula swelling or postnasal drip.     Tonsils: No tonsillar exudate.  Eyes:     General: Lids are normal. Vision grossly intact. Gaze aligned appropriately.     Extraocular Movements: Extraocular movements intact.     Conjunctiva/sclera: Conjunctivae normal.  Neck:     Trachea: Trachea and phonation normal.  Cardiovascular:     Rate and Rhythm: Normal rate and regular rhythm.     Heart sounds: Normal heart sounds, S1 normal and S2 normal.  Pulmonary:     Effort: Pulmonary effort is normal. No respiratory distress.     Breath sounds: Normal air entry. Wheezing (Inspiratory and expiratory wheezing heard all lung fields bilaterally.) present. No rhonchi or rales.     Comments: Harsh and frequent dry cough on exam with deep inspiration.  Speaking in full sentences without difficulty. Chest:     Chest wall: No tenderness.  Musculoskeletal:     Cervical back: Neck supple.     Right lower leg: Edema (+2 pitting edema) present.     Left lower leg: Edema (+2 pitting edema) present.  Lymphadenopathy:     Cervical: No cervical adenopathy.  Skin:    General: Skin is warm and dry.     Capillary Refill: Capillary refill takes less than 2 seconds.     Findings: No rash.  Neurological:     General: No focal deficit present.     Mental Status: He is alert and oriented to person, place, and time. Mental status is at baseline.     Cranial  Nerves: No dysarthria or facial asymmetry.  Psychiatric:        Mood and Affect: Mood normal.        Speech: Speech normal.        Behavior: Behavior normal.        Thought Content: Thought content normal.        Judgment: Judgment normal.      UC Treatments / Results  Labs (all labs ordered are listed, but  only abnormal results are displayed) Labs Reviewed  POC COVID19/FLU A&B COMBO - Normal    EKG   Radiology No results found.  Procedures Procedures (including critical care time)  Medications Ordered in UC Medications  dexamethasone (DECADRON) injection 10 mg (10 mg Intramuscular Given 04/15/23 1212)  ipratropium-albuterol (DUONEB) 0.5-2.5 (3) MG/3ML nebulizer solution 3 mL (3 mLs Nebulization Given 04/15/23 1212)    Initial Impression / Assessment and Plan / UC Course  I have reviewed the triage vital signs and the nursing notes.  Pertinent labs & imaging results that were available during my care of the patient were reviewed by me and considered in my medical decision making (see chart for details).   1.  Dependent edema, shortness of breath, acute cough Presentation suspicious for acute bronchitis/asthma exacerbation. DuoNeb and dexamethasone 10 mg IM given with minimal improvement in subjective shortness of breath and lung sounds. Oxygen saturation remains 92% on room air after DuoNeb which is below patient's baseline of 95 to 97%. Patient would benefit from further workup and evaluation in the emergency department setting to rule out further acute cardiopulmonary abnormality with imaging and blood work given lower oxygen saturation than normal and increased work of breathing.  His wife at the bedside will take him to the ER as he is currently stable for transport via POV. Offered EMS transport, patient and wife declined.  Advised to call EMS on the way should symptoms worsen. Discussed risks of deferring ER visit to which they both expressed understanding and agreement with plan.  Discharged from urgent care in stable condition.  Final Clinical Impressions(s) / UC Diagnoses   Final diagnoses:  Dependent edema  Shortness of breath  Acute cough     Discharge Instructions      Please go to the nearest ER for further workup and evaluation.      ED Prescriptions    None    PDMP not reviewed this encounter.   Carlisle Beers, [place] 04/15/23 1240

## 2023-04-15 NOTE — ED Triage Notes (Signed)
Patient presents to UC for cough, wheezing, and SOB x 4 days. States worse on Thursday. HX of asthma, wife states 02 sat last night was 90%. Treating symptoms with his inhalers with no relief.   Denies fever.

## 2023-04-15 NOTE — Discharge Instructions (Addendum)
 Please go to the nearest ER for further workup and evaluation.

## 2023-04-15 NOTE — ED Provider Notes (Signed)
[place] EMERGENCY DEPARTMENT AT [facility] Provider Note   CSN: [id number] Arrival date & time: 04/15/23  1314     History  Chief Complaint  Patient presents with   Cough   Shortness of Breath    Brian Myers is a 78 y.o. male.   Cough Associated symptoms: shortness of breath   Shortness of Breath Associated symptoms: cough    This patient is a 78 year old male, he has a history of asthma but also has a history of atrial fibrillation on Pradaxa and amiodarone and diltiazem.  He states that he was in his usual state of health until several days ago when he started having increasing coughing runny nose and head congestion as well as chest congestion.  He has been using his albuterol inhaler without much relief, he is not having any more fevers or chills and no nausea vomiting or diarrhea.  He was measuring oxygen levels as low as 90% at home which prompted him to go to the urgent care.  He was given a nebulized treatment as well as tested negative for COVID and flu, he was referred to the ER because of ongoing coughing to make sure there is no pneumonia.  At the urgent care the patient was given 10 mg of dexamethasone as well as a DuoNeb, oxygen was 92% initially and went up to 97%.  The patient denies any other sick contacts.  He reports to me that because of his asthma every time he gets a virus or get sick he ends up with a bronchitis afterwards    Home Medications Prior to Admission medications   Medication Sig Start Date End Date Taking? Authorizing Provider  albuterol (VENTOLIN HFA) 108 (90 Base) MCG/ACT inhaler Inhale 1 puff into the lungs every 4 (four) hours as needed for wheezing or shortness of breath. 05/26/22   Myrlene Broker, MD  amiodarone (PACERONE) 200 MG tablet Take 1 tablet (200 mg total) by mouth in the morning. 11/28/22   Lanier Prude, MD  Cholecalciferol (VITAMIN D-3) 125 MCG (5000 UT) TABS Take 5,000 Units by mouth daily at 6  (six) AM.    [provider]  cyanocobalamin (VITAMIN B12) 1000 MCG tablet Take 1,000 mcg by mouth in the morning.    [provider]  dabigatran (PRADAXA) 150 MG CAPS capsule Take 1 capsule (150 mg total) by mouth every 12 (twelve) hours. Overdue for follow-up, MUST see MD for FUTURE refills. 12/12/22   Lanier Prude, MD  diltiazem (CARDIZEM CD) 360 MG 24 hr capsule TAKE 1 CAPSULE BY MOUTH EVERY DAY 01/20/23   Lanier Prude, MD  esomeprazole (NEXIUM) 20 MG capsule Take 20 mg by mouth 2 (two) times daily. 10/25/15   [provider]  Ferrous Sulfate (IRON PO) Take 130 mg by mouth every other day. In the morning.    [provider]  finasteride (PROSCAR) 5 MG tablet Take 5 mg by mouth in the morning.    [provider]  fluticasone (FLONASE) 50 MCG/ACT nasal spray Place 1 spray into both nostrils 2 (two) times a day.    [provider]  losartan (COZAAR) 50 MG tablet TAKE 1 TABLET BY MOUTH EVERY DAY 01/20/23   Lanier Prude, MD  methylcellulose (ARTIFICIAL TEARS) 1 % ophthalmic solution Place 1 drop into both eyes 2 (two) times daily.    [provider]  SYMBICORT 160-4.5 MCG/ACT inhaler TAKE 2 PUFFS BY MOUTH TWICE A DAY 01/19/23  Myrlene Broker, MD      Allergies    Penicillins, Sulfa drugs cross reactors, and Statins    Review of Systems   Review of Systems  Respiratory:  Positive for cough and shortness of breath.   All other systems reviewed and are negative.   Physical Exam Updated Vital Signs BP (!) 145/95 (BP Location: Right Arm)   Pulse 89   Temp 98.5 F (36.9 C)   Resp (!) 24   SpO2 98%  Physical Exam Vitals and nursing note reviewed.  Constitutional:      General: He is not in acute distress.    Appearance: He is well-developed.  HENT:     Head: Normocephalic and atraumatic.     Mouth/Throat:     Pharynx: No oropharyngeal exudate.  Eyes:     General: No scleral icterus.       Right  eye: No discharge.        Left eye: No discharge.     Conjunctiva/sclera: Conjunctivae normal.     Pupils: Pupils are equal, round, and reactive to light.  Neck:     Thyroid: No thyromegaly.     Vascular: No JVD.  Cardiovascular:     Rate and Rhythm: Normal rate and regular rhythm.     Heart sounds: Normal heart sounds. No murmur heard.    No friction rub. No gallop.  Pulmonary:     Effort: Pulmonary effort is normal. No respiratory distress.     Breath sounds: Wheezing present. No rales.     Comments: Mild prolonged expiratory phase but speaks in full sentences, diffuse expiratory wheezing in all lung fields Abdominal:     General: Bowel sounds are normal. There is no distension.     Palpations: Abdomen is soft. There is no mass.     Tenderness: There is no abdominal tenderness.  Musculoskeletal:        General: No tenderness. Normal range of motion.     Cervical back: Normal range of motion and neck supple.     Right lower leg: Edema present.     Left lower leg: Edema present.  Lymphadenopathy:     Cervical: No cervical adenopathy.  Skin:    General: Skin is warm and dry.     Findings: No erythema or rash.  Neurological:     Mental Status: He is alert.     Coordination: Coordination normal.  Psychiatric:        Behavior: Behavior normal.     ED Results / Procedures / Treatments   Labs (all labs ordered are listed, but only abnormal results are displayed) Labs Reviewed  CBC - Abnormal; Notable for the following components:      Result Value   Hemoglobin 11.9 (*)    HCT 38.0 (*)    RDW 15.6 (*)    All other components within normal limits  COMPREHENSIVE METABOLIC PANEL  TROPONIN I (HIGH SENSITIVITY)    EKG EKG Interpretation Date/Time:  Saturday April 15 2023 13:22:49 EST Ventricular Rate:  93 PR Interval:    QRS Duration:  88 QT Interval:  422 QTC Calculation: 524 R Axis:   58  Text Interpretation: Atrial fibrillation Nonspecific ST and T wave  abnormality Prolonged QT Abnormal ECG When compared with ECG of 14-Feb-2023 11:07, No significant change since last tracing Confirmed by Meridee Score [phone]) on 04/15/2023 1:49:17 PM  Radiology DG Chest 2 View Result Date: 04/15/2023 CLINICAL DATA:  COPD EXAM: CHEST - 2 VIEW COMPARISON:  April 07, 2021 FINDINGS: The cardiomediastinal silhouette is unchanged in contour. No pleural effusion. No pneumothorax. No acute pleuroparenchymal abnormality. Gaseous distension of bowel beneath the LEFT hemidiaphragm. Multilevel degenerative changes of the thoracic spine. IMPRESSION: No acute cardiopulmonary abnormality. Electronically Signed   By: Meda Klinefelter M.D.   On: 04/15/2023 14:21    Procedures Procedures    Medications Ordered in ED Medications - No data to display  ED Course/ Medical Decision Making/ A&P                                 Medical Decision Making Amount and/or Complexity of Data Reviewed Labs: ordered. Radiology: ordered.    This patient presents to the ED for concern of coughing and shortness of breath, this involves an extensive number of treatment options, and is a complaint that carries with it a high risk of complications and morbidity.  The differential diagnosis includes bronchitis, pneumonia, pneumothorax, less likely be pulmonary embolism as the patient is anticoagulated.  Less likely to be congestive heart failure with no history, symptoms of head and chest congestion consistent with viral process   Co morbidities that complicate the patient evaluation  Obesity, known atrial fibrillation, asthma   Additional history obtained:  Additional history obtained from medical record External records from outside source obtained and reviewed including previous testing including COVID and flu earlier today. Patient had a cardioversion for atrial fibrillation in December 2024   Lab Tests:  I Ordered, and personally interpreted labs.  The pertinent results  include: Labs unremarkable including CBC metabolic panel and troponin   Imaging Studies ordered:  I ordered imaging studies including chest xray - 2 view  I independently visualized and interpreted imaging which showed  no infiltrates I agree with the radiologist interpretation   Cardiac Monitoring: / EKG:  The patient was maintained on a cardiac monitor.  I personally viewed and interpreted the cardiac monitored which showed an underlying rhythm of: Normal sinus rhythm    Problem List / ED Course / Critical interventions / Medication management  Ultimately this patient presents with ongoing cough, there is diffuse wheezing but the patient is speaking full sentences.  Oxygenation is normal, he has been given a combination of bronchodilators and steroids, at this time the patient is agreeable to go home with medications for home including albuterol with a nebulizer machine, a spacer for his handheld inhaler, prednisone and a course of doxycycline if he is not getting better. I have reviewed the patients home medicines and have made adjustments as needed   Social Determinants of Health:  Asthma, chronic bronchitis   Test / Admission - Considered:  Stable to be discharged, the patient is agreeable to the plan         Final Clinical Impression(s) / ED Diagnoses Final diagnoses:  None    Rx / DC Orders ED Discharge Orders     None         Eber Hong, MD 04/15/23 1457

## 2023-04-15 NOTE — ED Notes (Signed)
Patient is being discharged from the Urgent Care and sent to the Emergency Department via POV. Per Stevie Kern, NP, patient is in need of higher level of care due to SOB. Patient is aware and verbalizes understanding of plan of care.  Vitals:   04/15/23 1154 04/15/23 1229  BP: (!) 135/90   Pulse: 69   Resp: 20   Temp: 98.3 F (36.8 C)   SpO2: 93% (S) 92%

## 2023-04-15 NOTE — Discharge Instructions (Addendum)
Bronchitis is an inflammatory condition of the lungs that causes ongoing coughing.  It can be treated with multiple different medications however it usually takes a week or 2 to fully go away.  Please take the following medications to help  Albuterol is an inhaled medication which can help you to breathe better, you should take 2 puffs every 4 hours as needed, this may cause your heart to feel like it is racing, this should be a temporary side effect.  You can also use the nebulizer, I have prescribed a medication for that as well.   Prednisone is a steroid that helps to reduce certain types of inflammation and may be used for allergic reactions, some rashes such as poison ivy or dermatitis, for asthma attacks or bronchitis and for certain types of pain.  Please take this medicine exactly as prescribed - 40mg  by mouth daily for 5 days.  This can have certain side effects with some people including feeling like you can't sleep, feeling anxious or feeling like you are on a "high".  It should not cause weight gain if only taken for a short time.  Please be aware that this medication may also cause an elevation in your blood sugar if you are a diabetic so if you are a diabetic you will need to keep a very close eye on your blood sugar, make sure that you are eating an extremely low level of carbohydrates and taking your medications exactly as prescribed.  If you should develop severe high blood sugar or start to feel poorly return to the emergency department immediately   There is a cough suppressant called promethazine dextromethorphan which can also be used to help with coughing, it can be sedating so do not drive if you are taking this medication  Thank you for allowing [place] to treat you in the emergency department today.  After reviewing your examination and potential testing that was done it appears that you are safe to go home.  I would like for you to follow-up with your doctor within the next several  days, have them obtain your records and follow-up with them to review all potential tests and results from your visit.  If you should develop severe or worsening symptoms return to the emergency department immediately

## 2023-04-15 NOTE — ED Provider Triage Note (Signed)
Emergency Medicine Provider Triage Evaluation Note  Brian Myers , a 78 y.o. male  was evaluated in triage.  Pt complains of a cough, shortness of breath and wheezing.  Pt seen at urgent care and sent here.  Review of Systems  Positive: cough Negative: Abdominal pain  Physical Exam  BP (!) 145/95 (BP Location: Right Arm)   Pulse 89   Temp 98.5 F (36.9 C)   Resp (!) 24   SpO2 98%  Gen:   Awake, no distress   Resp:  Normal effort  MSK:   Moves extremities without difficulty  Other:    Medical Decision Making  Medically screening exam initiated at 1:34 PM.  Appropriate orders placed.  Brian Myers was informed that the remainder of the evaluation will be completed by another provider, this initial triage assessment does not replace that evaluation, and the importance of remaining in the ED until their evaluation is complete.     Brian Myers, [place] 04/15/23 1335

## 2023-04-21 ENCOUNTER — Encounter: Payer: Self-pay | Admitting: Cardiology

## 2023-04-26 ENCOUNTER — Encounter: Payer: Self-pay | Admitting: Pulmonary Disease

## 2023-04-26 ENCOUNTER — Ambulatory Visit: Payer: Medicare Other | Admitting: Pulmonary Disease

## 2023-04-26 VITALS — BP 124/90 | HR 77 | Ht 71.0 in | Wt 281.0 lb

## 2023-04-26 DIAGNOSIS — D6869 Other thrombophilia: Secondary | ICD-10-CM | POA: Insufficient documentation

## 2023-04-26 DIAGNOSIS — I10 Essential (primary) hypertension: Secondary | ICD-10-CM | POA: Diagnosis not present

## 2023-04-26 DIAGNOSIS — G4733 Obstructive sleep apnea (adult) (pediatric): Secondary | ICD-10-CM | POA: Diagnosis not present

## 2023-04-26 DIAGNOSIS — I4819 Other persistent atrial fibrillation: Secondary | ICD-10-CM | POA: Diagnosis not present

## 2023-04-26 NOTE — Progress Notes (Signed)
 Electrophysiology Office Note:   Date:  04/26/2023  ID:  Brian Myers, DOB [birth year], MRN [id number]  Primary Cardiologist: Lesleigh Noe, MD (Inactive) Primary Heart Failure: None Electrophysiologist: Lanier Prude, MD      History of Present Illness:   Brian Myers is a 78 y.o. male with h/o AF, HTN, OSA on CPAP, asthma, GERD, DM II seen today for routine electrophysiology followup - pre-ablation visit.   Since last being seen in our clinic the patient reports he has recently had a URI and continues to cough. He was seen in the ER for the same and was treated with prednisone and doxycycline. He has a hx of asthma and it always takes him 4-6 weeks to get rid of a cough. He feels overall better but is not back to baseline. Reports compliance with medications and CPAP.   He denies chest pain, palpitations, dyspnea, PND, orthopnea, nausea, vomiting, dizziness, syncope, edema, weight gain, or early satiety.   Review of systems complete and found to be negative unless listed in HPI.   EP Information / Studies Reviewed:    EKG is not ordered today. EKG from 04/17/23 reviewed which showed AF, 93 bpm  EKG Interpretation Date/Time:  Wednesday April 26 2023 14:46:42 EST Ventricular Rate:  77 PR Interval:    QRS Duration:  90 QT Interval:  404 QTC Calculation: 457 R Axis:   82  Text Interpretation: Atrial fibrillation Nonspecific T wave abnormality Confirmed by Canary Brim ([id number]) on 04/26/2023 3:15:29 PM   Studies:  Cardiac Monitor 06/2022 > persistent AF ECHO 06/2022 > LVEF 60-65%, LA mildly dilated, RA mildly dilated    Arrhythmia / AAD AF  Amiodarone > initiated 08/2022 DCCV 11/11/22 > SR for several days, then ERAF DCCV 01/30/23   Risk Assessment/Calculations:    CHA2DS2-VASc Score = 4   This indicates a 4.8% annual risk of stroke. The patient's score is based upon: CHF History: 0 HTN History: 1 Diabetes History: 1 Stroke History: 0 Vascular Disease  History: 0 Age Score: 2 Gender Score: 0          Physical Exam:   VS:  BP (!) 124/90 (BP Location: Left Arm, Patient Position: Sitting, Cuff Size: Large)   Pulse 77   Ht 5\' 11"  (1.803 m)   Wt 281 lb (127.5 kg)   SpO2 92%   BMI 39.19 kg/m    Wt Readings from Last 3 Encounters:  04/26/23 281 lb (127.5 kg)  02/14/23 284 lb 12.8 oz (129.2 kg)  01/23/23 283 lb (128.4 kg)     GEN: Well nourished, well developed in no acute distress NECK: No JVD; No carotid bruits CARDIAC: Irregularly irregular rate and rhythm, no murmurs, rubs, gallops RESPIRATORY:  Clear to auscultation without rales, wheezing or rhonchi  ABDOMEN: Soft, non-tender, non-distended EXTREMITIES:  No edema; No deformity   ASSESSMENT AND PLAN:    Persistent AF  High Risk Drug Monitoring: Amiodarone  CHA2DS2-VASc 4 -reviewed with Dr. Lalla Brothers, given ongoing cough / URI sx will move out  -cardiac CT completed on 04/07/23  -no missed doses of OAC, reviewed importance of adherence   -continue amiodarone as bridge to ablation  -pending timing, may need repeat labs  -procedure reviewed, pt agreeable best plan of action is to move out  Secondary Hypercoagulable State  -continue Pradaxa, Cr Cl 175mL/min based on last labs -reviewed importance of no missed doses of OAC  Hypertension  -well controlled on current regimen    OSA  -  CPAP compliance encouraged   Obesity  -slow gradual weight loss encouraged    Follow up with Dr. Lalla Brothers  as planned for AF ablation   Signed, Canary Brim, NP-C, AGACNP-BC [facility] - Electrophysiology  04/26/2023, 4:51 PM

## 2023-04-26 NOTE — Patient Instructions (Signed)
 Medication Instructions:  Your physician recommends that you continue on your current medications as directed. Please refer to the Current Medication list given to you today.  *If you need a refill on your cardiac medications before your next appointment, please call your pharmacy*  Lab Work: None  If you have labs (blood work) drawn today and your tests are completely normal, you will receive your results only by: MyChart Message (if you have MyChart) OR A paper copy in the mail If you have any lab test that is abnormal or we need to change your treatment, we will call you to review the results.  Follow-Up: At [facility], you and your health needs are our priority.  As part of our continuing mission to provide you with exceptional heart care, we have created designated Provider Care Teams.  These Care Teams include your primary Cardiologist (physician) and Advanced Practice Providers (APPs -  Physician Assistants and Nurse Practitioners) who all work together to provide you with the care you need, when you need it.  Your next appointment:   We will be in touch with you regarding rescheduling your ablation.

## 2023-04-27 ENCOUNTER — Telehealth: Payer: Self-pay

## 2023-04-27 DIAGNOSIS — I4819 Other persistent atrial fibrillation: Secondary | ICD-10-CM

## 2023-04-27 NOTE — Telephone Encounter (Signed)
 Pt has been rescheduled for 3/18 at 12:30 pm. He will go next week to [facility] for updated labs.   I will send updated Instruction letter via MyChart.

## 2023-04-27 NOTE — Telephone Encounter (Signed)
-----   Message from Canary Brim sent at 04/26/2023  4:55 PM EST ----- Regarding: Re-schedule Ablation Hi Ladies,   I saw this guy pre-ablation today. He recently had a URI and had a cough.  I spoke with Dr. Lalla Brothers and we need to move him out a bit.  Can you please work on re-scheduling him.  If you need me to see him again before ablation, no problem.  He had his CT on 2/7.  May need labs pending on timing of new date.     Let me know if I can help,   Brandi

## 2023-05-09 DIAGNOSIS — I4819 Other persistent atrial fibrillation: Secondary | ICD-10-CM | POA: Diagnosis not present

## 2023-05-10 ENCOUNTER — Telehealth (HOSPITAL_COMMUNITY): Payer: Self-pay

## 2023-05-10 LAB — BASIC METABOLIC PANEL
BUN/Creatinine Ratio: 14 (ref 10–24)
BUN: 13 mg/dL (ref 8–27)
CO2: 22 mmol/L (ref 20–29)
Calcium: 9 mg/dL (ref 8.6–10.2)
Chloride: 101 mmol/L (ref 96–106)
Creatinine, Ser: 0.94 mg/dL (ref 0.76–1.27)
Glucose: 124 mg/dL — ABNORMAL HIGH (ref 70–99)
Potassium: 3.8 mmol/L (ref 3.5–5.2)
Sodium: 142 mmol/L (ref 134–144)
eGFR: 85 mL/min/{1.73_m2} (ref >59)

## 2023-05-10 LAB — CBC
Hematocrit: 37.4 % — ABNORMAL LOW (ref 37.5–51.0)
Hemoglobin: 11.8 g/dL — ABNORMAL LOW (ref 13.0–17.7)
MCH: 27.7 pg (ref 26.6–33.0)
MCHC: 31.6 g/dL (ref 31.5–35.7)
MCV: 88 fL (ref 79–97)
Platelets: 272 10*3/uL (ref 150–450)
RBC: 4.26 x10E6/uL (ref 4.14–5.80)
RDW: 14.5 % (ref 11.6–15.4)
WBC: 6.1 10*3/uL (ref 3.4–10.8)

## 2023-05-10 NOTE — Telephone Encounter (Addendum)
 Call placed to patient to discuss upcoming procedure.   CT: completed and acceptable.  Labs: completed and acceptable.   Any recent signs of acute illness or been started on antibiotics? Patient reports his symptoms have improved from acute bronchitis in February, but he does still have some occasional coughing spells, more than his usual. He denied any increase in shortness of breath from his baseline. Will update Dr. Lalla Brothers.   Any diabetic medications to hold? No Any missed doses of blood thinner? No Advised patient to continue taking ANTICOAGULANT: Pradaxa (Dabigatran) without missing any doses.  Medication instructions:  On the morning of your procedure DO NOT take any medication., including Pradaxa or the procedure may be rescheduled. Nothing to eat or drink after midnight prior to your procedure.  Confirmed patient is scheduled for Atrial Fibrillation Ablation on Tuesday, March 18 with Dr. Steffanie Dunn. Instructed patient to arrive at the Main Entrance A at [facility]: [address] [place], [place] [address] and check in at Admitting at 10:30 AM  Advised of plan to go home the same day and will only stay overnight if medically necessary. You MUST have a responsible adult to drive you home and MUST be with you the first 24 hours after you arrive home or your procedure could be cancelled.  Patient verbalized understanding to all instructions provided and agreed to proceed with procedure.

## 2023-05-10 NOTE — Telephone Encounter (Signed)
 Per Dr. Lalla Brothers, recommend to push procedure out and have patient see an APP prior to rescheduling ablation due to continued symptoms.   Informed patient of provider recommendations. Scheduled follow up with APP on 05/29/23 at 8:40 AM. Patient verbalized understanding.

## 2023-05-10 NOTE — Telephone Encounter (Signed)
 Attempted to reach patient to discuss upcoming procedure, no answer. Left VM for patient to return call.

## 2023-05-12 DIAGNOSIS — H40013 Open angle with borderline findings, low risk, bilateral: Secondary | ICD-10-CM | POA: Diagnosis not present

## 2023-05-12 DIAGNOSIS — H35363 Drusen (degenerative) of macula, bilateral: Secondary | ICD-10-CM | POA: Diagnosis not present

## 2023-05-12 DIAGNOSIS — H25811 Combined forms of age-related cataract, right eye: Secondary | ICD-10-CM | POA: Diagnosis not present

## 2023-05-12 DIAGNOSIS — Z83511 Family history of glaucoma: Secondary | ICD-10-CM | POA: Diagnosis not present

## 2023-05-16 ENCOUNTER — Encounter (HOSPITAL_COMMUNITY): Admission: RE | Payer: Medicare Other | Source: Ambulatory Visit

## 2023-05-16 ENCOUNTER — Ambulatory Visit (HOSPITAL_COMMUNITY): Admission: RE | Admit: 2023-05-16 | Payer: Medicare Other | Source: Ambulatory Visit | Admitting: Cardiology

## 2023-05-16 SURGERY — ATRIAL FIBRILLATION ABLATION
Anesthesia: General

## 2023-05-26 ENCOUNTER — Ambulatory Visit (HOSPITAL_COMMUNITY): Payer: Medicare Other | Admitting: Physician Assistant

## 2023-05-28 NOTE — Progress Notes (Unsigned)
  Electrophysiology Office Note:   Date:  05/29/2023  ID:  BREVAN LUBERTO, DOB 1945-10-13, MRN [id number]  Primary Cardiologist: Lesleigh Noe, MD (Inactive) Electrophysiologist: Lanier Prude, MD      History of Present Illness:   Brian Myers is a 78 y.o. male with h/o AF, HTN, OSA on CPAP, asthma, GERD, DM II  seen today for routine electrophysiology followup.   Since last being seen in our clinic the patient reports doing well. No further URI symptoms. Currently, he denies chest pain, palpitations, dyspnea, PND, orthopnea, nausea, vomiting, dizziness, syncope, edema, weight gain, or early satiety.  He has been explained the risks and benefits of proceeding with ablation and has no additional questions today.   Review of systems complete and found to be negative unless listed in HPI.   EP Information / Studies Reviewed:    EKG is not ordered today. EKG from 04/26/23 reviewed which showed AF at 77 bpm       Arrhythmia/Device History No specialty comments available.   Physical Exam:   VS:  BP 132/88 (BP Location: Right Arm, Patient Position: Sitting, Cuff Size: Large)   Pulse 78   Resp 16   Ht 5\' 11"  (1.803 m)   Wt 282 lb (127.9 kg)   SpO2 98%   BMI 39.33 kg/m    Wt Readings from Last 3 Encounters:  05/29/23 282 lb (127.9 kg)  04/26/23 281 lb (127.5 kg)  02/14/23 284 lb 12.8 oz (129.2 kg)     GEN: No acute distress NECK: No JVD; No carotid bruits CARDIAC: Regular rate and rhythm, no murmurs, rubs, gallops RESPIRATORY:  Clear to auscultation without rales, wheezing or rhonchi  ABDOMEN: Soft, non-tender, non-distended EXTREMITIES:  No edema; No deformity   ASSESSMENT AND PLAN:    Persistent AF High risk drug monitoring: Amiodarone Seen 2/26 and ablation deferred with URI symptoms Continue amiodarone as bridge to ablation He will present for labs after 4/8 so that they are within 30 days.  No missed doses of OAC  Secondary hypercoagulable state Pt on   Pradaxa  as above   HTN Stable on current regimen  OSA  Encouraged nightly CPAP  Obesity Body mass index is 39.33 kg/m.  Encouraged lifestyle modification   Follow up with Dr. Lalla Brothers as usual post procedure  Signed, Graciella Freer, PA-C

## 2023-05-29 ENCOUNTER — Ambulatory Visit: Attending: Student | Admitting: Student

## 2023-05-29 ENCOUNTER — Encounter: Payer: Self-pay | Admitting: Student

## 2023-05-29 ENCOUNTER — Encounter: Payer: Self-pay | Admitting: *Deleted

## 2023-05-29 VITALS — BP 132/88 | HR 78 | Resp 16 | Ht 71.0 in | Wt 282.0 lb

## 2023-05-29 DIAGNOSIS — I4819 Other persistent atrial fibrillation: Secondary | ICD-10-CM | POA: Diagnosis not present

## 2023-05-29 DIAGNOSIS — G4733 Obstructive sleep apnea (adult) (pediatric): Secondary | ICD-10-CM | POA: Diagnosis not present

## 2023-05-29 DIAGNOSIS — I10 Essential (primary) hypertension: Secondary | ICD-10-CM | POA: Diagnosis not present

## 2023-05-29 DIAGNOSIS — D6869 Other thrombophilia: Secondary | ICD-10-CM | POA: Diagnosis not present

## 2023-05-29 NOTE — Patient Instructions (Signed)
 Medication Instructions:  Your physician recommends that you continue on your current medications as directed. Please refer to the Current Medication list given to you today.  *If you need a refill on your cardiac medications before your next appointment, please call your pharmacy*  Lab Work: BMET, CBC-after 06/06/23 If you have labs (blood work) drawn today and your tests are completely normal, you will receive your results only by: MyChart Message (if you have MyChart) OR A paper copy in the mail If you have any lab test that is abnormal or we need to change your treatment, we will call you to review the results.  Testing/Procedures: See letter  Follow-Up: At [facility], you and your health needs are our priority.  As part of our continuing mission to provide you with exceptional heart care, our providers are all part of one team.  This team includes your primary Cardiologist (physician) and Advanced Practice Providers or APPs (Physician Assistants and Nurse Practitioners) who all work together to provide you with the care you need, when you need it.  Your next appointment:   Follow up will be arranged for you and print out on your discharge summary after your procedure.  We recommend signing up for the patient portal called "MyChart".  Sign up information is provided on this After Visit Summary.  MyChart is used to connect with patients for Virtual Visits (Telemedicine).  Patients are able to view lab/test results, encounter notes, upcoming appointments, etc.  Non-urgent messages can be sent to your provider as well.   To learn more about what you can do with MyChart, go to [email or web address].     1st Floor: - Lobby - Registration  - Pharmacy  - Lab - Cafe  2nd Floor: - PV Lab - Diagnostic Testing (echo, CT, nuclear med)  3rd Floor: - Vacant  4th Floor: - TCTS (cardiothoracic surgery) - AFib Clinic - Structural Heart Clinic - Vascular Surgery  - Vascular  Ultrasound  5th Floor: - HeartCare Cardiology (general and EP) - Clinical Pharmacy for coumadin, hypertension, lipid, weight-loss medications, and med management appointments    Valet parking services will be available as well.

## 2023-05-30 ENCOUNTER — Telehealth: Payer: Self-pay | Admitting: *Deleted

## 2023-05-30 NOTE — Telephone Encounter (Signed)
-----   Message from [facility] April G sent at 05/29/2023  6:49 PM EDT ----- Regarding: RE: Ablation letter, CT?? Hey Irmgard Rampersaud I just logged in to look at my messages. This pt will NOT need another CT. If you don't mind just call and let him know and I will delete the order.  Thanks April ----- Message ----- From: Valrie Hart, CMA Sent: 05/29/2023   9:31 AM EDT To: April Garrison, CMA Subject: Ablation letter, CT??                          Hi April,   This patient did not have current letter for ablation scheduled for 07/06/23 in his chart. Sherri was my pod neighbor so she did the letter for me. She also indicated that the pt would need a CT, so she placed order and did letter for that as well. When I went in to discuss with patient he was asking if he would require another CT since he had one done sometime back. I instructed him that I would send you a msg to follow up and clarify this and that you would be in contact with him to further discuss.  Thanks, Jonothan Heberle

## 2023-05-30 NOTE — Telephone Encounter (Signed)
 LMOM, okay per dpr, informing pt of msg below per April regarding CT.

## 2023-06-09 ENCOUNTER — Other Ambulatory Visit: Payer: Self-pay | Admitting: Cardiology

## 2023-06-09 NOTE — Telephone Encounter (Signed)
 Prescription refill request for Pradaxa received.  Indication:afib Last office visit:3/25 Weight:127.9  kg Age:78 Scr:0.94  3/25 CrCl:121.54  ml/min  Prescription refilled

## 2023-06-13 ENCOUNTER — Telehealth (HOSPITAL_COMMUNITY): Payer: Self-pay

## 2023-06-13 ENCOUNTER — Ambulatory Visit (HOSPITAL_COMMUNITY): Admitting: Physician Assistant

## 2023-06-13 NOTE — Telephone Encounter (Signed)
 Attempted to reach patient to discuss upcoming procedure, no answer. Left VM for patient to return call.

## 2023-06-15 ENCOUNTER — Ambulatory Visit (HOSPITAL_COMMUNITY)

## 2023-06-19 DIAGNOSIS — I10 Essential (primary) hypertension: Secondary | ICD-10-CM | POA: Diagnosis not present

## 2023-06-19 DIAGNOSIS — I4819 Other persistent atrial fibrillation: Secondary | ICD-10-CM | POA: Diagnosis not present

## 2023-06-19 DIAGNOSIS — D6869 Other thrombophilia: Secondary | ICD-10-CM | POA: Diagnosis not present

## 2023-06-19 DIAGNOSIS — G4733 Obstructive sleep apnea (adult) (pediatric): Secondary | ICD-10-CM | POA: Diagnosis not present

## 2023-06-19 NOTE — Telephone Encounter (Signed)
 Returned missed call from patient. Left VM for patient to return call.

## 2023-06-20 LAB — CBC
Hematocrit: 36.4 % — ABNORMAL LOW (ref 37.5–51.0)
Hemoglobin: 11.9 g/dL — ABNORMAL LOW (ref 13.0–17.7)
MCH: 27.7 pg (ref 26.6–33.0)
MCHC: 32.7 g/dL (ref 31.5–35.7)
MCV: 85 fL (ref 79–97)
Platelets: 263 10*3/uL (ref 150–450)
RBC: 4.3 x10E6/uL (ref 4.14–5.80)
RDW: 15 % (ref 11.6–15.4)
WBC: 7.6 10*3/uL (ref 3.4–10.8)

## 2023-06-20 LAB — BASIC METABOLIC PANEL WITH GFR
BUN/Creatinine Ratio: 20 (ref 10–24)
BUN: 19 mg/dL (ref 8–27)
CO2: 24 mmol/L (ref 20–29)
Calcium: 9.6 mg/dL (ref 8.6–10.2)
Chloride: 105 mmol/L (ref 96–106)
Creatinine, Ser: 0.96 mg/dL (ref 0.76–1.27)
Glucose: 96 mg/dL (ref 70–99)
Potassium: 4.4 mmol/L (ref 3.5–5.2)
Sodium: 145 mmol/L — ABNORMAL HIGH (ref 134–144)
eGFR: 82 mL/min/{1.73_m2} (ref >59)

## 2023-06-23 NOTE — Telephone Encounter (Signed)
 Return call received from patient to discuss upcoming procedure.   CT: completed.  Labs: completed.   Any recent signs of acute illness or been started on antibiotics? No. Reports fully recovered from URI.  Any new medications started? No Any medications to hold? No Any missed doses of blood thinner? No Advised patient to continue taking ANTICOAGULANT: Pradaxa  (Dabigatran ) twice daily without missing any doses.  Medication instructions:  On the morning of your procedure DO NOT take any medication., including Pradaxa  or the procedure may be rescheduled. Nothing to eat or drink after midnight prior to your procedure.  Confirmed patient is scheduled for Atrial Fibrillation Ablation on Thursday, May 8 with Dr. Harvie Liner. Instructed patient to arrive at the Main Entrance A at [facility]: [address] [place], [place] [address] and check in at Admitting at 8:00 AM.  Advised of plan to go home the same day and will only stay overnight if medically necessary. You MUST have a responsible adult to drive you home and MUST be with you the first 24 hours after you arrive home or your procedure could be cancelled.  Patient verbalized understanding to all instructions provided and agreed to proceed with procedure.

## 2023-07-05 NOTE — Anesthesia Preprocedure Evaluation (Addendum)
 Anesthesia Evaluation  Patient identified by MRN, date of birth, ID band Patient awake    Reviewed: Allergy & Precautions, NPO status , Patient's Chart, lab work & pertinent test results  Airway Mallampati: II  TM Distance: >3 FB Neck ROM: Full    Dental no notable dental hx. (+) Teeth Intact, Dental Advisory Given   Pulmonary asthma , sleep apnea    Pulmonary exam normal breath sounds clear to auscultation       Cardiovascular Exercise Tolerance: Good hypertension, Pt. on medications (-) angina (-) Past MI + dysrhythmias (on pradaxa ) Atrial Fibrillation  Rhythm:Irregular Rate:Normal  06/2022 echo  1. Left ventricular ejection fraction, by estimation, is 60 to 65%. The  left ventricle has normal function. The left ventricle has no regional  wall motion abnormalities. There is mild asymmetric left ventricular  hypertrophy of the basal-septal segment.  Left ventricular diastolic parameters are indeterminate.   2. Right ventricular systolic function is normal. The right ventricular  size is normal.   3. Left atrial size was mildly dilated.   4. Right atrial size was mildly dilated.   5. The mitral valve is grossly normal. Trivial mitral valve  regurgitation.   6. The aortic valve is tricuspid. There is mild calcification of the  aortic valve. There is mild thickening of the aortic valve. Aortic valve  regurgitation is not visualized. Aortic valve sclerosis/calcification is  present, without any evidence of  aortic stenosis.   7. Aortic dilatation noted. There is mild dilatation of the aortic root,  measuring 41 mm.     Neuro/Psych    GI/Hepatic ,GERD  ,,  Endo/Other  diabetes    Renal/GU      Musculoskeletal  (+) Arthritis , Osteoarthritis,    Abdominal  (+) + obese  Peds  Hematology Lab Results      Component                Value               Date                      WBC                      7.6                  06/19/2023                HGB                      11.9 (L)            06/19/2023                HCT                      36.4 (L)            06/19/2023                MCV                      85                  06/19/2023                PLT  263                 06/19/2023              Anesthesia Other Findings All: pcn, sulfa, Statins   Reproductive/Obstetrics                             Anesthesia Physical Anesthesia Plan  ASA: 3  Anesthesia Plan: General   Post-op Pain Management: Minimal or no pain anticipated   Induction: Intravenous  PONV Risk Score and Plan: 3 and Treatment may vary due to age or medical condition and Ondansetron   Airway Management Planned: Oral ETT  Additional Equipment: None  Intra-op Plan:   Post-operative Plan: Extubation in OR  Informed Consent: I have reviewed the patients History and Physical, chart, labs and discussed the procedure including the risks, benefits and alternatives for the proposed anesthesia with the patient or authorized representative who has indicated his/her understanding and acceptance.     Dental advisory given  Plan Discussed with: CRNA and Surgeon  Anesthesia Plan Comments:        Anesthesia Quick Evaluation

## 2023-07-05 NOTE — Pre-Procedure Instructions (Signed)
 Attempted to call patient regarding procedure instructions for tomorrow.  Left voicemail on the following items: Arrival time 0800 Nothing to eat or drink after midnight No meds AM of procedure Responsible person to drive you home and stay with you for 24 hrs  Have you missed any doses of anti-coagulant Pradaxa - should be taken twice a day, if you have missed any doses please let us  know.  Don't take dose morning of procedure.

## 2023-07-06 ENCOUNTER — Other Ambulatory Visit: Payer: Self-pay

## 2023-07-06 ENCOUNTER — Telehealth: Payer: Self-pay | Admitting: *Deleted

## 2023-07-06 ENCOUNTER — Ambulatory Visit (HOSPITAL_COMMUNITY)
Admission: RE | Admit: 2023-07-06 | Discharge: 2023-07-06 | Disposition: A | Discharge status: Home / Self Care | Attending: Cardiology | Admitting: Cardiology

## 2023-07-06 ENCOUNTER — Other Ambulatory Visit (HOSPITAL_COMMUNITY): Payer: Self-pay

## 2023-07-06 ENCOUNTER — Ambulatory Visit (HOSPITAL_COMMUNITY): Payer: Self-pay | Admitting: Anesthesiology

## 2023-07-06 ENCOUNTER — Ambulatory Visit (HOSPITAL_BASED_OUTPATIENT_CLINIC_OR_DEPARTMENT_OTHER): Payer: Self-pay | Admitting: Anesthesiology

## 2023-07-06 ENCOUNTER — Encounter (HOSPITAL_COMMUNITY)
Admission: RE | Disposition: A | Discharge status: Home / Self Care | Payer: Self-pay | Source: Home / Self Care | Attending: Cardiology

## 2023-07-06 DIAGNOSIS — G4733 Obstructive sleep apnea (adult) (pediatric): Secondary | ICD-10-CM | POA: Diagnosis not present

## 2023-07-06 DIAGNOSIS — K219 Gastro-esophageal reflux disease without esophagitis: Secondary | ICD-10-CM | POA: Insufficient documentation

## 2023-07-06 DIAGNOSIS — E119 Type 2 diabetes mellitus without complications: Secondary | ICD-10-CM | POA: Diagnosis not present

## 2023-07-06 DIAGNOSIS — Z7901 Long term (current) use of anticoagulants: Secondary | ICD-10-CM | POA: Diagnosis not present

## 2023-07-06 DIAGNOSIS — I10 Essential (primary) hypertension: Secondary | ICD-10-CM | POA: Insufficient documentation

## 2023-07-06 DIAGNOSIS — G4730 Sleep apnea, unspecified: Secondary | ICD-10-CM | POA: Diagnosis not present

## 2023-07-06 DIAGNOSIS — J45909 Unspecified asthma, uncomplicated: Secondary | ICD-10-CM | POA: Insufficient documentation

## 2023-07-06 DIAGNOSIS — I4891 Unspecified atrial fibrillation: Secondary | ICD-10-CM

## 2023-07-06 DIAGNOSIS — Z79899 Other long term (current) drug therapy: Secondary | ICD-10-CM | POA: Insufficient documentation

## 2023-07-06 DIAGNOSIS — I4819 Other persistent atrial fibrillation: Secondary | ICD-10-CM | POA: Diagnosis not present

## 2023-07-06 HISTORY — PX: ATRIAL FIBRILLATION ABLATION: EP1191

## 2023-07-06 LAB — POCT ACTIVATED CLOTTING TIME
Activated Clotting Time: 308 s
Activated Clotting Time: 314 s

## 2023-07-06 MED ORDER — ONDANSETRON HCL 4 MG/2ML IJ SOLN
INTRAMUSCULAR | Status: DC | PRN
  Administered 2023-07-06: 4 mg via INTRAVENOUS

## 2023-07-06 MED ORDER — ATROPINE SULFATE 1 MG/ML IV SOLN
INTRAVENOUS | Status: DC | PRN
  Administered 2023-07-06: 1 mg via INTRAVENOUS

## 2023-07-06 MED ORDER — PROTAMINE SULFATE 10 MG/ML IV SOLN
INTRAVENOUS | Status: DC | PRN
  Administered 2023-07-06: 35 mg via INTRAVENOUS

## 2023-07-06 MED ORDER — COLCHICINE 0.6 MG PO TABS
0.6000 mg | ORAL_TABLET | Freq: Two times a day (BID) | ORAL | 0 refills | Status: DC
  Filled 2023-07-06: qty 10, 5d supply, fill #0

## 2023-07-06 MED ORDER — PHENYLEPHRINE HCL-NACL 20-0.9 MG/250ML-% IV SOLN
INTRAVENOUS | Status: DC | PRN
  Administered 2023-07-06: 30 ug/min via INTRAVENOUS

## 2023-07-06 MED ORDER — SODIUM CHLORIDE 0.9 % IV SOLN: INTRAVENOUS | Status: DC

## 2023-07-06 MED ORDER — PANTOPRAZOLE SODIUM 40 MG PO TBEC
40.0000 mg | DELAYED_RELEASE_TABLET | Freq: Every day | ORAL | Status: DC
Start: 2023-07-06 — End: 2023-07-06
  Administered 2023-07-06: 40 mg via ORAL
  Filled 2023-07-06: qty 1

## 2023-07-06 MED ORDER — DABIGATRAN ETEXILATE MESYLATE 150 MG PO CAPS
150.0000 mg | ORAL_CAPSULE | Freq: Two times a day (BID) | ORAL | Status: DC
  Administered 2023-07-06: 150 mg via ORAL
  Filled 2023-07-06: qty 1

## 2023-07-06 MED ORDER — ONDANSETRON HCL 4 MG/2ML IJ SOLN: 4.0000 mg | Freq: Four times a day (QID) | INTRAMUSCULAR | Status: DC | PRN

## 2023-07-06 MED ORDER — PROPOFOL 10 MG/ML IV BOLUS
INTRAVENOUS | Status: DC | PRN
  Administered 2023-07-06: 150 mg via INTRAVENOUS

## 2023-07-06 MED ORDER — LIDOCAINE 2% (20 MG/ML) 5 ML SYRINGE
INTRAMUSCULAR | Status: DC | PRN
  Administered 2023-07-06: 80 mg via INTRAVENOUS

## 2023-07-06 MED ORDER — SUGAMMADEX SODIUM 200 MG/2ML IV SOLN
INTRAVENOUS | Status: DC | PRN
  Administered 2023-07-06: 498.8 mg via INTRAVENOUS

## 2023-07-06 MED ORDER — ACETAMINOPHEN 325 MG PO TABS: 650.0000 mg | ORAL_TABLET | ORAL | Status: DC | PRN

## 2023-07-06 MED ORDER — HEPARIN (PORCINE) IN NACL 2000-0.9 UNIT/L-% IV SOLN
INTRAVENOUS | Status: DC | PRN
  Administered 2023-07-06 (×4): 1000 mL

## 2023-07-06 MED ORDER — DEXAMETHASONE SODIUM PHOSPHATE 10 MG/ML IJ SOLN
INTRAMUSCULAR | Status: DC | PRN
  Administered 2023-07-06: 5 mg via INTRAVENOUS

## 2023-07-06 MED ORDER — COLCHICINE 0.6 MG PO TABS
0.6000 mg | ORAL_TABLET | Freq: Two times a day (BID) | ORAL | Status: DC
  Administered 2023-07-06: 0.6 mg via ORAL
  Filled 2023-07-06: qty 1

## 2023-07-06 MED ORDER — HEPARIN SODIUM (PORCINE) 1000 UNIT/ML IJ SOLN
INTRAMUSCULAR | Status: DC | PRN
  Administered 2023-07-06: 4000 [IU] via INTRAVENOUS
  Administered 2023-07-06: 16000 [IU] via INTRAVENOUS

## 2023-07-06 MED ORDER — SODIUM CHLORIDE 0.9% FLUSH: 3.0000 mL | INTRAVENOUS | Status: DC | PRN

## 2023-07-06 MED ORDER — FENTANYL CITRATE (PF) 100 MCG/2ML IJ SOLN
INTRAMUSCULAR | Status: AC
  Filled 2023-07-06: qty 2

## 2023-07-06 MED ORDER — ROCURONIUM BROMIDE 10 MG/ML (PF) SYRINGE
PREFILLED_SYRINGE | INTRAVENOUS | Status: DC | PRN
  Administered 2023-07-06: 80 mg via INTRAVENOUS
  Administered 2023-07-06: 10 mg via INTRAVENOUS

## 2023-07-06 MED ORDER — FENTANYL CITRATE (PF) 250 MCG/5ML IJ SOLN
INTRAMUSCULAR | Status: DC | PRN
  Administered 2023-07-06 (×2): 50 ug via INTRAVENOUS

## 2023-07-06 MED ORDER — SODIUM CHLORIDE 0.9% FLUSH: 3.0000 mL | Freq: Two times a day (BID) | INTRAVENOUS | Status: DC

## 2023-07-06 MED ORDER — SODIUM CHLORIDE 0.9 % IV SOLN: 250.0000 mL | INTRAVENOUS | Status: DC | PRN

## 2023-07-06 MED ORDER — PANTOPRAZOLE SODIUM 40 MG PO TBEC
40.0000 mg | DELAYED_RELEASE_TABLET | Freq: Every day | ORAL | 0 refills | Status: AC
  Filled 2023-07-06: qty 45, 45d supply, fill #0

## 2023-07-06 SURGICAL SUPPLY — 8 items
CATH FARAWAVE ABLATION 31 (CATHETERS) IMPLANT
CATH WEBSTER BI DIR CS D-F CRV (CATHETERS) IMPLANT
COVER SWIFTLINK CONNECTOR (BAG) ×1 IMPLANT
DILATOR VESSEL 38 20CM 16FR (INTRODUCER) IMPLANT
KIT VERSACROSS CNCT FARADRIVE (KITS) IMPLANT
PACK EP LF (CUSTOM PROCEDURE TRAY) ×1 IMPLANT
SHEATH FARADRIVE STEERABLE (SHEATH) IMPLANT
SHEATH PINNACLE 9F 10CM (SHEATH) IMPLANT

## 2023-07-06 NOTE — Telephone Encounter (Signed)
 LVM to confirm appt for 5/12 - included address and time - [place] 07/06/23

## 2023-07-06 NOTE — Transfer of Care (Signed)
 Immediate Anesthesia Transfer of Care Note  Patient: Brian Myers  Procedure(s) Performed: ATRIAL FIBRILLATION ABLATION  Patient Location: PACU and Cath Lab  Anesthesia Type:General  Level of Consciousness: awake  Airway & Oxygen  Therapy: Patient Spontanous Breathing and Patient connected to nasal cannula oxygen   Post-op Assessment: Report given to RN and Post -op Vital signs reviewed and stable  Post vital signs: Reviewed and stable  Last Vitals:  Vitals Value Taken Time  BP 113/76 (89)   Temp    Pulse 70   Resp 13   SpO2 94     Last Pain:  Vitals:   07/06/23 0840  TempSrc:   PainSc: 0-No pain         Complications: There were no known notable events for this encounter.

## 2023-07-06 NOTE — Discharge Instructions (Signed)

## 2023-07-06 NOTE — Anesthesia Postprocedure Evaluation (Signed)
 Anesthesia Post Note  Patient: [name]  Procedure(s) Performed: ATRIAL FIBRILLATION ABLATION     Patient location during evaluation: PACU Anesthesia Type: General Level of consciousness: awake and alert Pain management: pain level controlled Vital Signs Assessment: post-procedure vital signs reviewed and stable Respiratory status: spontaneous breathing, nonlabored ventilation, respiratory function stable and patient connected to nasal cannula oxygen  Cardiovascular status: blood pressure returned to baseline and stable Postop Assessment: no apparent nausea or vomiting Anesthetic complications: no  There were no known notable events for this encounter.  Last Vitals:  Vitals:   07/06/23 1430 07/06/23 1501  BP: (!) 135/97 (!) 136/95  Pulse: 74 76  Resp: 14   Temp:    SpO2: 94% 95%    Last Pain:  Vitals:   07/06/23 1400  TempSrc:   PainSc: 0-No pain                 Rosalita Combe

## 2023-07-06 NOTE — Progress Notes (Signed)
 Pt ambulated to and from bathroom to void with no signs of oozing from bilateral groin sites

## 2023-07-06 NOTE — H&P (Signed)
 Electrophysiology Office Follow up Visit Note:     Date:  07/06/2023    ID:  Brian Myers, DOB 01-19-1946, MRN [id number]   PCP:  Adelia Homestead, MD       [facility] Cardiologist:  Mickiel Albany, MD (Inactive)  [facility] Electrophysiologist:  Boyce Byes, MD      Interval History:     Brian Myers is a 78 y.o. male who presents for a follow up visit.    Last seen 09/08/2022 for persistent AF. At the last appt, it sounded like he had been in AF for a long time. He takes pradaxa . I started amiodarone  and recommended a dCCV in 4 weeks.    DCCV was performed on 11/11/2022 successfully.  He stayed in sinus for several days before returning to AF.    Today he is with his wife.  He tells me that he felt much better when he was back in rhythm for the few days.  He could immediately tell when he returned to atrial fibrillation.  He has not missed any doses of Pradaxa .  He continues to take amiodarone .   Presents for AF ablation today.       Objective Past medical, surgical, social and family history were reviewed.   ROS:   Please see the history of present illness.    All other systems reviewed and are negative.   EKGs/Labs/Other Studies Reviewed:     The following studies were reviewed today:   07/04/2022 echo EF 60 RV normal Dilated LA     EKG Interpretation Date/Time:                  Monday December 12 2022 15:22:12 EDT Ventricular Rate:         80 PR Interval:                   QRS Duration:             88 QT Interval:                 294 QTC Calculation:339 R Axis:                         73   Text Interpretation:Atrial fibrillation Confirmed by Harvie Liner [phone]) on 12/12/2022 3:36:49 PM     Physical Exam:     VS:  BP 167/95   Pulse 78   Ht 5\' 11"  (1.803 m)   Wt 279 lb (126.6 kg)   SpO2 95%   BMI 38.91 kg/m         Wt Readings from Last 3 Encounters:  12/12/22 279 lb (126.6 kg)  11/11/22 280 lb (127 kg)  11/09/22 279  lb 12.8 oz (126.9 kg)      GEN:  Well nourished, well developed in no acute distress.  Obese CARDIAC: Irregularly irregular, no murmurs, rubs, gallops RESPIRATORY:  Clear to auscultation without rales, wheezing or rhonchi      Assessment ASSESSMENT:     1. Persistent atrial fibrillation (HCC)   2. Primary hypertension   3. Encounter for long-term (current) use of high-risk medication     PLAN:     In order of problems listed above:   #Persistent AF #High risk drug monitoring - amiodarone  Now on amiodarone . Maintained rhythm for several days before returning to AF. I have discussed treatment options with the patient including rate control only, trying another DCCV given  more time to load amiodarone  or pursuing catheter ablation.   --------   Discussed treatment options today for AF including antiarrhythmic drug therapy and ablation. Discussed risks, recovery and likelihood of success with each treatment strategy. Risk, benefits, and alternatives to EP study and ablation for afib were discussed. These risks include but are not limited to stroke, bleeding, vascular damage, tamponade, perforation, damage to the esophagus, lungs, phrenic nerve and other structures, pulmonary vein stenosis, worsening renal function, coronary vasospasm and death.  Discussed potential need for repeat ablation procedures and antiarrhythmic drugs after an initial ablation. The patient understands these risk and wishes to proceed.  We will therefore proceed with catheter ablation at the next available time.  Carto, ICE, anesthesia are requested for the procedure.  Will also obtain CT PV protocol prior to the procedure to exclude LAA thrombus and further evaluate atrial anatomy.   -------   Prior to the PVI, plan for repeat DCCV. I have discussed the procedure in detail including the risks and he wishes to proceed. No missed doses of OAC in the past 4 weeks.   ------   Check CMP, TSH and FT4 prior to  cardioversion.     #Hypertension At goal today.  Recommend checking blood pressures 1-2 times per week at home and recording the values.  Recommend bringing these recordings to the primary care physician.     Presents for AF ablation today. Procedure reviewed.       Signed, Harvie Liner, MD, [facility], [facility] 07/06/2023 Electrophysiology [facility]

## 2023-07-06 NOTE — Anesthesia Procedure Notes (Signed)
 Procedure Name: Intubation Date/Time: 07/06/2023 10:07 AM  Performed by: Merna Aase, CRNAPre-anesthesia Checklist: Patient identified, Patient being monitored, Timeout performed, Emergency Drugs available and Suction available Patient Re-evaluated:Patient Re-evaluated prior to induction Oxygen  Delivery Method: Circle system utilized Preoxygenation: Pre-oxygenation with 100% oxygen  Induction Type: IV induction Ventilation: Mask ventilation without difficulty Laryngoscope Size: Mac and 4 Grade View: Grade II Tube type: Oral Tube size: 7.5 mm Number of attempts: 1 Airway Equipment and Method: Stylet Placement Confirmation: ETT inserted through vocal cords under direct vision, positive ETCO2 and breath sounds checked- equal and bilateral Secured at: 23 cm Tube secured with: Tape Dental Injury: Teeth and Oropharynx as per pre-operative assessment

## 2023-07-07 ENCOUNTER — Encounter (HOSPITAL_COMMUNITY): Payer: Self-pay | Admitting: Cardiology

## 2023-07-07 ENCOUNTER — Telehealth (HOSPITAL_COMMUNITY): Payer: Self-pay

## 2023-07-07 NOTE — Telephone Encounter (Signed)
 Spoke with patient to complete post procedure follow up call.  Patient reports no complications with groin sites.   Instructions reviewed with patient:  Remove large bandage at puncture site after 24 hours. It is normal to have bruising, tenderness and a pea or marble sized lump/knot at the groin site which can take up to three months to resolve.  Get help right away if you notice sudden swelling at the puncture site.  Check your puncture site every day for signs of infection: fever, redness, swelling, pus drainage, warmth, foul odor or excessive pain. If this occurs, please call the office at [phone], to speak with the nurse. Get help right away if your puncture site is bleeding and the bleeding does not stop after applying firm pressure to the area.  You may continue to have skipped beats/ atrial fibrillation during the first several months after your procedure.  It is very important not to miss any doses of your blood thinner Pradaxa . Patient restarted taking this medication on 07/06/23.   You will follow up with the Afib clinic on 08/03/23 and follow up with the APP on 10/04/23.   Patient verbalized understanding to all instructions provided.

## 2023-07-10 ENCOUNTER — Ambulatory Visit (INDEPENDENT_AMBULATORY_CARE_PROVIDER_SITE_OTHER): Payer: Medicare Other | Admitting: Otolaryngology

## 2023-07-10 ENCOUNTER — Encounter (INDEPENDENT_AMBULATORY_CARE_PROVIDER_SITE_OTHER): Payer: Self-pay | Admitting: Otolaryngology

## 2023-07-10 ENCOUNTER — Encounter: Payer: Self-pay | Admitting: Cardiology

## 2023-07-10 VITALS — BP 151/74 | HR 77

## 2023-07-10 DIAGNOSIS — H6123 Impacted cerumen, bilateral: Secondary | ICD-10-CM

## 2023-07-10 DIAGNOSIS — H903 Sensorineural hearing loss, bilateral: Secondary | ICD-10-CM | POA: Diagnosis not present

## 2023-07-11 DIAGNOSIS — H6123 Impacted cerumen, bilateral: Secondary | ICD-10-CM | POA: Insufficient documentation

## 2023-07-11 DIAGNOSIS — H903 Sensorineural hearing loss, bilateral: Secondary | ICD-10-CM | POA: Insufficient documentation

## 2023-07-11 NOTE — Progress Notes (Signed)
 Patient ID: Brian Myers, male   DOB: 03-23-1945, 78 y.o.   MRN: [id number]  Follow-up: Hearing loss  HPI: The patient is a 78 year old male who returns today for his follow-up evaluation.  The patient has a history of bilateral sensorineural hearing loss.  He was fitted with bilateral hearing aids at [facility]  The patient returns today reporting no significant change in his hearing.  Currently he denies any otalgia, otorrhea, or vertigo.  He also has a history of recurrent cerumen impaction.  Exam: General: Communicates without difficulty, well nourished, no acute distress. Head: Normocephalic, no evidence injury, no tenderness, facial buttresses intact without stepoff. Face/sinus: No tenderness to palpation and percussion. Facial movement is normal and symmetric. Eyes: PERRL, EOMI. No scleral icterus, conjunctivae clear. Neuro: CN II exam reveals vision grossly intact.  No nystagmus at any point of gaze. Ears: Auricles well formed without lesions.  Bilateral cerumen impaction.  Nose: External evaluation reveals normal support and skin without lesions.  Dorsum is intact.  Anterior rhinoscopy reveals congested mucosa over anterior aspect of inferior turbinates and intact septum.  No purulence noted. Oral:  Oral cavity and oropharynx are intact, symmetric, without erythema or edema.  Mucosa is moist without lesions. Neck: Full range of motion without pain.  There is no significant lymphadenopathy.  No masses palpable.  Thyroid  bed within normal limits to palpation.  Parotid glands and submandibular glands equal bilaterally without mass.  Trachea is midline. Neuro:  CN 2-12 grossly intact.   Procedure: Bilateral cerumen disimpaction Anesthesia: None Description: Under the operating microscope, the cerumen is carefully removed with a combination of cerumen currette, alligator forceps, and suction catheters.  After the cerumen is removed, the TMs are noted to be normal.  No mass, erythema, or lesions. The  patient tolerated the procedure well.    Assessment: 1.  Bilateral cerumen impaction.  After the disimpaction procedure, both tympanic membranes and middle ear spaces are noted to be normal.  2.  Subjectively stable bilateral sensorineural hearing loss, worse on the left side at high frequencies.  Plan: 1.  Otomicroscopy with bilateral cerumen disimpaction.  2.  The physical exam findings are reviewed with the patient.  3.  The small possibility of a retrocochlear lesion causing his asymmetric hearing loss is discussed.  The options of conservative observation versus MRI scan are reviewed.  The patient would like to proceed with conservative observation. 4.  Continue the use of his hearing aids. The patient will return to [facility] audiology for hearing aid adjustment. 5.  The patient will return for reevaluation in 6 months.

## 2023-07-17 ENCOUNTER — Ambulatory Visit: Payer: Medicare Other | Admitting: Internal Medicine

## 2023-07-17 VITALS — BP 160/80 | HR 87 | Temp 98.5°F | Ht 71.0 in | Wt 281.0 lb

## 2023-07-17 DIAGNOSIS — E118 Type 2 diabetes mellitus with unspecified complications: Secondary | ICD-10-CM

## 2023-07-17 DIAGNOSIS — I10 Essential (primary) hypertension: Secondary | ICD-10-CM | POA: Diagnosis not present

## 2023-07-17 DIAGNOSIS — I4819 Other persistent atrial fibrillation: Secondary | ICD-10-CM | POA: Diagnosis not present

## 2023-07-17 DIAGNOSIS — J4520 Mild intermittent asthma, uncomplicated: Secondary | ICD-10-CM

## 2023-07-17 LAB — POCT GLYCOSYLATED HEMOGLOBIN (HGB A1C): HbA1c POC (<> result, manual entry): 6 % (ref 4.0–5.6)

## 2023-07-17 NOTE — Progress Notes (Signed)
   Subjective:   Patient ID: Brian Myers, male    DOB: 08-26-1945, 78 y.o.   MRN: [id number]  HPI The patient is a 78 YO man coming in for medical management (see A/P for details). Recent A fib ablation about 10 days ago.   Review of Systems  Constitutional: Negative.   HENT: Negative.    Eyes: Negative.   Respiratory:  Negative for cough, chest tightness and shortness of breath.   Cardiovascular:  Negative for chest pain, palpitations and leg swelling.  Gastrointestinal:  Negative for abdominal distention, abdominal pain, constipation, diarrhea, nausea and vomiting.  Musculoskeletal: Negative.   Skin: Negative.   Neurological: Negative.   Psychiatric/Behavioral: Negative.      Objective:  Physical Exam Constitutional:      Appearance: He is well-developed.  HENT:     Head: Normocephalic and atraumatic.  Cardiovascular:     Rate and Rhythm: Normal rate and regular rhythm.  Pulmonary:     Effort: Pulmonary effort is normal. No respiratory distress.     Breath sounds: Normal breath sounds. No wheezing or rales.  Abdominal:     General: Bowel sounds are normal. There is no distension.     Palpations: Abdomen is soft.     Tenderness: There is no abdominal tenderness. There is no rebound.  Musculoskeletal:     Cervical back: Normal range of motion.  Skin:    General: Skin is warm and dry.  Neurological:     Mental Status: He is alert and oriented to person, place, and time.     Coordination: Coordination normal.     Vitals:   07/17/23 1353 07/17/23 1402  BP: (!) 160/80 (!) 160/80  Pulse: 87   Temp: 98.5 F (36.9 C)   TempSrc: Oral   SpO2: 91%   Weight: 281 lb (127.5 kg)   Height: 5\' 11"  (1.803 m)     Assessment & Plan:

## 2023-07-17 NOTE — Patient Instructions (Signed)
 We will see you back in 6 months. The sugars are doing better today the HgA1c is 6.0%

## 2023-07-18 NOTE — Assessment & Plan Note (Signed)
 Recent ablation and sounds regular today. He is hoping to stop amiodarone  at some point once stable. Is also taking diltiazem  and pradaxa . Energy seems to be recovering some since ablation.

## 2023-07-18 NOTE — Assessment & Plan Note (Signed)
 POC HgA1c done and 6.0. He would like to get more active now that he has had a fib ablation and is recovering some stamina and energy. Diet controlled and continue monitoring every 6 months. Foot exam done.

## 2023-07-18 NOTE — Assessment & Plan Note (Signed)
 No flare today and using symbicort  and albuterol  prn. Encouraged more exercise as activity tolerance improves to help with endurance.

## 2023-07-18 NOTE — Assessment & Plan Note (Signed)
 BP is elevated today which is unusual for him. Would like to monitor closely and will be seeing cardiology in about 2 weeks. For now continue diltiazem  and losartan . If elevated at cardiology would recommend increase losartan  to 100 mg daily.

## 2023-07-18 NOTE — Assessment & Plan Note (Signed)
 BMI 39 and complicated by diabetes, hypertension, a fib. Counseled about diet and exercise to help.

## 2023-07-21 ENCOUNTER — Other Ambulatory Visit: Payer: Self-pay | Admitting: Internal Medicine

## 2023-07-21 DIAGNOSIS — R972 Elevated prostate specific antigen [PSA]: Secondary | ICD-10-CM | POA: Diagnosis not present

## 2023-07-21 DIAGNOSIS — J4520 Mild intermittent asthma, uncomplicated: Secondary | ICD-10-CM

## 2023-07-26 ENCOUNTER — Ambulatory Visit: Payer: Medicare Other | Admitting: Physician Assistant

## 2023-07-28 DIAGNOSIS — R972 Elevated prostate specific antigen [PSA]: Secondary | ICD-10-CM | POA: Diagnosis not present

## 2023-07-28 DIAGNOSIS — N401 Enlarged prostate with lower urinary tract symptoms: Secondary | ICD-10-CM | POA: Diagnosis not present

## 2023-07-28 DIAGNOSIS — R3914 Feeling of incomplete bladder emptying: Secondary | ICD-10-CM | POA: Diagnosis not present

## 2023-08-03 ENCOUNTER — Ambulatory Visit (HOSPITAL_COMMUNITY)
Admission: RE | Admit: 2023-08-03 | Discharge: 2023-08-03 | Disposition: A | Discharge status: Home / Self Care | Source: Ambulatory Visit | Attending: Internal Medicine | Admitting: Internal Medicine

## 2023-08-03 VITALS — BP 130/72 | HR 64 | Ht 71.0 in | Wt 283.8 lb

## 2023-08-03 DIAGNOSIS — D6869 Other thrombophilia: Secondary | ICD-10-CM | POA: Diagnosis not present

## 2023-08-03 DIAGNOSIS — I4819 Other persistent atrial fibrillation: Secondary | ICD-10-CM

## 2023-08-03 DIAGNOSIS — I4891 Unspecified atrial fibrillation: Secondary | ICD-10-CM | POA: Diagnosis not present

## 2023-08-03 DIAGNOSIS — Z79899 Other long term (current) drug therapy: Secondary | ICD-10-CM | POA: Insufficient documentation

## 2023-08-03 DIAGNOSIS — Z5181 Encounter for therapeutic drug level monitoring: Secondary | ICD-10-CM | POA: Diagnosis not present

## 2023-08-03 NOTE — Progress Notes (Signed)
 Primary Care Physician: Adelia Homestead, MD Primary Cardiologist: Mickiel Albany, MD (Inactive) Electrophysiologist: Boyce Byes, MD     Referring Physician: Dr. Marcie Sever is a 78 y.o. male with a history of HTN, OSA on CPAP, asthma, GERD, T2DM, and atrial fibrillation who presents for consultation in the [facility]. Patient is on Pradaxa  150 mg BID for a CHADS2VASC score of 4.  On evaluation today, patient is currently in NSR. S/p Afib ablation on 07/06/23 by Dr. Marven Slimmer. No episodes of Afib since ablation. He is currently on amiodarone  200 mg daily. No chest pain or SOB. Leg sites healed without issue. No missed doses of Pradaxa .   Today, he denies symptoms of orthopnea, PND, lower extremity edema, dizziness, presyncope, syncope, snoring, daytime somnolence, bleeding, or neurologic sequela. The patient is tolerating medications without difficulties and is otherwise without complaint today.    he has a BMI of Body mass index is 39.58 kg/m.Aaron Aas Filed Weights   08/03/23 1341  Weight: 128.7 kg    Current Outpatient Medications  Medication Sig Dispense Refill   albuterol  (VENTOLIN  HFA) 108 (90 Base) MCG/ACT inhaler Inhale 2 puffs into the lungs every 4 (four) hours as needed for wheezing or shortness of breath. 1 each 3   amiodarone  (PACERONE ) 200 MG tablet Take 1 tablet (200 mg total) by mouth in the morning. 90 tablet 3   Cholecalciferol (VITAMIN D -3) 125 MCG (5000 UT) TABS Take 5,000 Units by mouth daily at 6 (six) AM.     cyanocobalamin  (VITAMIN B12) 1000 MCG tablet Take 1,000 mcg by mouth in the morning.     dabigatran  (PRADAXA ) 150 MG CAPS capsule Take 1 capsule (150 mg total) by mouth 2 (two) times daily. 180 capsule 1   diltiazem  (CARDIZEM  CD) 360 MG 24 hr capsule TAKE 1 CAPSULE BY MOUTH EVERY DAY 90 capsule 3   Ferrous Sulfate (IRON PO) Take 180 mg by mouth every other day.     finasteride (PROSCAR) 5 MG tablet Take  5 mg by mouth in the morning.     fluticasone (FLONASE) 50 MCG/ACT nasal spray Place 1 spray into both nostrils 2 (two) times a day.     losartan  (COZAAR ) 50 MG tablet TAKE 1 TABLET BY MOUTH EVERY DAY 90 tablet 3   methylcellulose (ARTIFICIAL TEARS) 1 % ophthalmic solution Place 2 drops into both eyes 2 (two) times daily.     pantoprazole  (PROTONIX ) 40 MG tablet Take 1 tablet (40 mg total) by mouth daily. Short term medication post procedure, does not need refills.  Pt should hold nexium until finished with protonix  and then resume. 45 tablet 0   SYMBICORT  160-4.5 MCG/ACT inhaler TAKE 2 PUFFS BY MOUTH TWICE A DAY 30.6 each 1   esomeprazole (NEXIUM) 20 MG capsule Take 20 mg by mouth 2 (two) times daily. (Patient not taking: Reported on 08/03/2023)  1   No current facility-administered medications for this encounter.    Atrial Fibrillation Management history:  Previous antiarrhythmic drugs: amiodarone  Previous cardioversions: 05/2020 Previous ablations: 07/06/23 Anticoagulation history: Pradaxa    ROS- All systems are reviewed and negative except as per the HPI above.  Physical Exam: BP 130/72   Pulse 64   Ht 5\' 11"  (1.803 m)   Wt 128.7 kg   BMI 39.58 kg/m   GEN: Well nourished, well developed in no acute distress NECK: No JVD; No carotid bruits CARDIAC: Regular rate and rhythm, no murmurs,  rubs, gallops RESPIRATORY:  Clear to auscultation without rales, wheezing or rhonchi  ABDOMEN: Soft, non-tender, non-distended EXTREMITIES:  No edema; No deformity   EKG today demonstrates  Vent. rate 64 BPM PR interval 204 ms QRS duration 90 ms QT/QTcB 400/413 ms P-R-T axes * 65 7 Sinus rhythm Sinus rhythm with first degree AV block Nonspecific T wave abnormality Abnormal ECG When compared with ECG of 06-Jul-2023 12:19, No significant change was found Confirmed by Minnie Amber ([id number]) on 08/03/2023 2:15:39 PM  Echo 07/04/22 demonstrated  1. Left ventricular ejection fraction, by estimation,  is 60 to 65%. The  left ventricle has normal function. The left ventricle has no regional  wall motion abnormalities. There is mild asymmetric left ventricular  hypertrophy of the basal-septal segment.  Left ventricular diastolic parameters are indeterminate.   2. Right ventricular systolic function is normal. The right ventricular  size is normal.   3. Left atrial size was mildly dilated.   4. Right atrial size was mildly dilated.   5. The mitral valve is grossly normal. Trivial mitral valve  regurgitation.   6. The aortic valve is tricuspid. There is mild calcification of the  aortic valve. There is mild thickening of the aortic valve. Aortic valve  regurgitation is not visualized. Aortic valve sclerosis/calcification is  present, without any evidence of  aortic stenosis.   7. Aortic dilatation noted. There is mild dilatation of the aortic root,  measuring 41 mm.   ASSESSMENT & PLAN CHA2DS2-VASc Score = 4  The patient's score is based upon: CHF History: 0 HTN History: 1 Diabetes History: 1 Stroke History: 0 Vascular Disease History: 0 Age Score: 2 Gender Score: 0       ASSESSMENT AND PLAN: Persistent Atrial Fibrillation (ICD10:  I48.19) The patient's CHA2DS2-VASc score is 4, indicating a 4.8% annual risk of stroke.   S/p Afib ablation on 07/06/23 by Dr. Marven Slimmer.  He is currently in NSR. Continue current medication regimen without change. Patient has intermittently noted his HR is not as high as he would expect when walking. I would expect amiodarone  to be discontinued at upcoming appointment. If HR discrepancy is still noted patient may possibly benefit from monitor / stress test.  Secondary Hypercoagulable State (ICD10:  D68.69) The patient is at significant risk for stroke/thromboembolism based upon his CHA2DS2-VASc Score of 4.  Continue Dabigatran  (Pradaxa ).  No missed doses.   High risk medication monitoring (ICD10: J342684) Patient requires ongoing monitoring for  anti-arrhythmic medication which has the potential to cause life threatening arrhythmias or AV block. Qtc stable. Continue amiodarone  200 mg daily.   Follow up with EP as scheduled.    Minnie Amber, PA-C  Afib Clinic [facility] [address] [place], [place] [address] [phone]

## 2023-08-17 ENCOUNTER — Ambulatory Visit: Admitting: Physician Assistant

## 2023-08-22 ENCOUNTER — Encounter: Payer: Self-pay | Admitting: *Deleted

## 2023-10-02 NOTE — Progress Notes (Unsigned)
 Cardiology Office Note Date:  10/02/2023  Patient ID:  Brian Myers Apr 15, 1945, MRN [id number] PCP:  Rollene Almarie LABOR, MD  Cardiologist:  Dr. Claudene Electrophysiologist: Dr. Kelsie (inactive) >> Dr. Cindie    Chief Complaint:  *** 90 day post ablatio visit  History of Present Illness: Brian Myers is a 78 y.o. male with history of  OSA w/CPAP, HLD, DM, morbid obesity AFib  Saw Dr. Kelsie 02/05/21: diltiazem  dose increased, discussed ablation and Tikosyn, pt preferred to continue rate control strategy  I saw him 06/06/22 In the last year he feels like he has become more symptomatic with his AFib, HR elevates quicker with less exertion, gets fatigued easier. No CP Some awareness of his heart beat No near syncope or syncope. No bleeding or signs of bleeding Given: Declining exertional capacity, higher HRs with less exertion. He would like to see if we could consider rhythm control. Discussed harder to be successful since he has been in AF a couple years Planned for an echo, 3 day monitor for HR control and DCCV to see if we could gain any SR  TTE w/preserved LVEF , mild asymmetric septal hypertrophy, no significant VHD  Pesistent atrial fibrillation.   Range 42 bpm to 158 bpm, average rate 82 bpm.   One wide complex run of 10 beats (average 127 bpm) Persistent, rate controlled atrial fibrillation.  DCCV planned but cancelled 2/2 family member illness.  I saw him May 2024 >  Planned to refer to Dr. Cindie > consider rhyhm control options  He saw Dr. Cindie, 09/08/22, progressively more symptomatic with his Afib (perhaps multifactorial with weight/deconditioning) Started on amiodarone  > DCCV to see if we could obtain SR Not felt tikosyn candidate with longer QT  DCCV successful 11/11/22 > ERAF DCCV 01/30/23 successful  AFib ablation 07/06/23  Saw the Afib clinic team 08/03/23, doing well, no symptoms of AFib, no procedural concerns Discussed reduced HR  excursion with exertion   TODAY  *** pradaxa , dose, bleeding, labs *** symptoms, HR? Brady, excursion *** stop amio?, labs *** monitor?  Afib Hx Diagnosed 20+ years ago  AAD Hx Flecainide  started 2012, is current > stopped May 22, with recurrent AF Rate control strategy at pt preference Dec 2022 Rhythm control planned July 2024 > with increased symptoms Amiodarone  started July 2024 07/06/23: AFib ablation  Past Medical History:  Diagnosis Date   Arthritis    KNEES   Asthma    Bladder problem    enlarged bladder   BPH (benign prostatic hypertrophy)    DIFFICULTY GETTING STREAM STARTED   Eczema    HANDS   GERD (gastroesophageal reflux disease)    H/O hiatal hernia    Hyperlipidemia    Hyperlipidemia    DOES NOT TOLERATE ANY OF THE STATINS   Persistent atrial fibrillation (HCC)    Dr Claudene   Pneumonia    LAST EPISODE JAN 2015   Right knee meniscal tear    PAIN RT KNEE   Sleep apnea    with cpap    Past Surgical History:  Procedure Laterality Date   ATRIAL FIBRILLATION ABLATION N/A 07/06/2023   Procedure: ATRIAL FIBRILLATION ABLATION;  Surgeon: Cindie Ole DASEN, MD;  Location: [facility];  Service: Cardiovascular;  Laterality: N/A;   CARDIOVERSION N/A 06/10/2020   Procedure: CARDIOVERSION;  Surgeon: Loni Soyla LABOR, MD;  Location: [facility];  Service: Cardiovascular;  Laterality: N/A;   CARDIOVERSION N/A 11/11/2022   Procedure: CARDIOVERSION;  Surgeon: Francyne Headland, MD;  Location: [facility];  Service: Cardiovascular;  Laterality: N/A;   CARDIOVERSION N/A 01/30/2023   Procedure: CARDIOVERSION;  Surgeon: Francyne Headland, MD;  Location: [facility];  Service: Cardiovascular;  Laterality: N/A;   CERVICAL FUSION  1991   KNEE ARTHROSCOPY Right 04/17/2013   Procedure: ARTHROSCOPY RIGHT KNEE WITH MEDIAL AND LATERAL DEBRIDEMENT AND CONDRAPLASTY;  Surgeon: Dempsey LULLA Moan, MD;  Location: [facility];  Service: Orthopedics;  Laterality: Right;   LEFT  KNEE ARTHROSCPY  2009   TEE WITHOUT CARDIOVERSION N/A 06/10/2020   Procedure: TRANSESOPHAGEAL ECHOCARDIOGRAM (TEE);  Surgeon: Loni Soyla LABOR, MD;  Location[facility] ENDOSCOPY;  Service: Cardiovascular;  Laterality: N/A;    Current Outpatient Medications  Medication Sig Dispense Refill   albuterol  (VENTOLIN  HFA) 108 (90 Base) MCG/ACT inhaler Inhale 2 puffs into the lungs every 4 (four) hours as needed for wheezing or shortness of breath. 1 each 3   amiodarone  (PACERONE ) 200 MG tablet Take 1 tablet (200 mg total) by mouth in the morning. 90 tablet 3   Cholecalciferol (VITAMIN D -3) 125 MCG (5000 UT) TABS Take 5,000 Units by mouth daily at 6 (six) AM.     cyanocobalamin  (VITAMIN B12) 1000 MCG tablet Take 1,000 mcg by mouth in the morning.     dabigatran  (PRADAXA ) 150 MG CAPS capsule Take 1 capsule (150 mg total) by mouth 2 (two) times daily. 180 capsule 1   diltiazem  (CARDIZEM  CD) 360 MG 24 hr capsule TAKE 1 CAPSULE BY MOUTH EVERY DAY 90 capsule 3   esomeprazole (NEXIUM) 20 MG capsule Take 20 mg by mouth 2 (two) times daily. (Patient not taking: Reported on 08/03/2023)  1   Ferrous Sulfate (IRON PO) Take 180 mg by mouth every other day.     finasteride (PROSCAR) 5 MG tablet Take 5 mg by mouth in the morning.     fluticasone (FLONASE) 50 MCG/ACT nasal spray Place 1 spray into both nostrils 2 (two) times a day.     losartan  (COZAAR ) 50 MG tablet TAKE 1 TABLET BY MOUTH EVERY DAY 90 tablet 3   methylcellulose (ARTIFICIAL TEARS) 1 % ophthalmic solution Place 2 drops into both eyes 2 (two) times daily.     pantoprazole  (PROTONIX ) 40 MG tablet Take 1 tablet (40 mg total) by mouth daily. Short term medication post procedure, does not need refills.  Pt should hold nexium until finished with protonix  and then resume. 45 tablet 0   SYMBICORT  160-4.5 MCG/ACT inhaler TAKE 2 PUFFS BY MOUTH TWICE A DAY 30.6 each 1   No current facility-administered medications for this visit.    Allergies:   Penicillins, Sulfa  drugs cross reactors, and Statins   Social History:  The patient  reports that he has never smoked. He has never used smokeless tobacco. He reports current alcohol use of about 28.0 standard drinks of alcohol per week. He reports that he does not use drugs.   Family History:  The patient's family history includes Arrhythmia in his mother; Hypertension in his father; Mitral valve prolapse in his mother.  ROS:  Please see the history of present illness.    All other systems are reviewed and otherwise negative.   PHYSICAL EXAM:  VS:  There were no vitals taken for this visit. BMI: There is no height or weight on file to calculate BMI. Well nourished, well developed, in no acute distress HEENT: normocephalic, atraumatic Neck: no JVD, carotid bruits or masses Cardiac: ***; no significant murmurs, no  rubs, or gallops Lungs: *** CTA b/l, no wheezing, rhonchi or rales Abd: soft, nontender, obese MS: no deformity or atrophy Ext *** trace-1+ (he reports his baseline) Skin: warm and dry, no rash Neuro:  No gross deficits appreciated Psych: euthymic mood, full affect   EKG:  Done today and reviewed by myself shows  ***  07/06/23: EPS/ablation CONCLUSIONS: 1. Successful PVI 2. Successful ablation/isolation of the posterior wall 3. Intracardiac echo reveals trivial pericardial effusion, normal LA architecture 4. No early apparent complications. 5. Colchicine  0.6mg  PO BID x 5 days 6. Protonix  40mg  PO daily x 45 days  07/04/22: TTE 1. Left ventricular ejection fraction, by estimation, is 60 to 65%. The  left ventricle has normal function. The left ventricle has no regional  wall motion abnormalities. There is mild asymmetric left ventricular  hypertrophy of the basal-septal segment.  Left ventricular diastolic parameters are indeterminate.   2. Right ventricular systolic function is normal. The right ventricular  size is normal.   3. Left atrial size was mildly dilated.   4. Right atrial  size was mildly dilated.   5. The mitral valve is grossly normal. Trivial mitral valve  regurgitation.   6. The aortic valve is tricuspid. There is mild calcification of the  aortic valve. There is mild thickening of the aortic valve. Aortic valve  regurgitation is not visualized. Aortic valve sclerosis/calcification is  present, without any evidence of  aortic stenosis.   7. Aortic dilatation noted. There is mild dilatation of the aortic root,  measuring 41 mm.   Comparison(s): EF 60%, modearte asymmetrical septal hypertrophy basal and  septal , AOR 42mm.     02/01/21: TTE 1. Left ventricular ejection fraction, by estimation, is 60 to 65%. The  left ventricle has normal function. The left ventricle has no regional  wall motion abnormalities. There is moderate asymmetric left ventricular  hypertrophy of the basal and septal  segments. Left ventricular diastolic parameters were normal.   2. Right ventricular systolic function is normal. The right ventricular  size is normal.   3. Left atrial size was moderately dilated.   4. No sub costal windows.   5. The mitral valve is abnormal. Trivial mitral valve regurgitation. No  evidence of mitral stenosis.   6. The aortic valve is tricuspid. There is mild calcification of the  aortic valve. There is mild thickening of the aortic valve. Aortic valve  regurgitation is not visualized. Aortic valve sclerosis is present, with  no evidence of aortic valve stenosis.   7. Aortic dilatation noted. There is moderate dilatation of the aortic  root, measuring 42 mm.   8. The inferior vena cava is normal in size with greater than 50%  respiratory variability, suggesting right atrial pressure of 3 mmHg.    08/30/2016: TTE Study Conclusions  - Left ventricle: The cavity size was normal. Wall thickness was    normal. Systolic function was normal. The estimated ejection    fraction was in the range of 60% to 65%. Wall motion was normal;    there were  no regional wall motion abnormalities. Features are    consistent with a pseudonormal left ventricular filling pattern,    with concomitant abnormal relaxation and increased filling    pressure (grade 2 diastolic dysfunction).  - Right atrium: The atrium was mildly dilated.    Recent Labs: 01/18/2023: TSH 6.660 04/15/2023: ALT 35 06/19/2023: BUN 19; Creatinine, Ser 0.96; Hemoglobin 11.9; Platelets 263; Potassium 4.4; Sodium 145  01/23/2023: Cholesterol 232;  HDL 51.70; LDL Cholesterol 160; Total CHOL/HDL Ratio 4; Triglycerides 104.0; VLDL 20.8   CrCl cannot be calculated (Patient's most recent lab result is older than the maximum 21 days allowed.).   Wt Readings from Last 3 Encounters:  08/03/23 283 lb 12.8 oz (128.7 kg)  07/17/23 281 lb (127.5 kg)  07/06/23 275 lb (124.7 kg)     Other studies reviewed: Additional studies/records reviewed today include: summarized above  ASSESSMENT AND PLAN:  1. Longstanding persistent AFib     CHA2DS2Vasc is 3, on pradaxa , *** appropriately dosed     ***  2. HTN     ***  3. HLD     Not addressed today  4. Secondary hypercoagulable state    Disposition: *** , sooner if needed     Current medicines are reviewed at length with the patient today.  The patient did not have any concerns regarding medicines.  Brian Charlies Arthur, PA-C 10/02/2023 12:45 PM     [facility] [address] [place] [address] [phone] (office)  [phone] (fax)

## 2023-10-04 ENCOUNTER — Ambulatory Visit: Attending: Physician Assistant | Admitting: Physician Assistant

## 2023-10-04 ENCOUNTER — Encounter: Payer: Self-pay | Admitting: Physician Assistant

## 2023-10-04 VITALS — BP 114/62 | HR 56 | Ht 71.0 in | Wt 286.7 lb

## 2023-10-04 DIAGNOSIS — I10 Essential (primary) hypertension: Secondary | ICD-10-CM | POA: Insufficient documentation

## 2023-10-04 DIAGNOSIS — I4811 Longstanding persistent atrial fibrillation: Secondary | ICD-10-CM | POA: Diagnosis not present

## 2023-10-04 DIAGNOSIS — D6869 Other thrombophilia: Secondary | ICD-10-CM | POA: Diagnosis not present

## 2023-10-04 DIAGNOSIS — I4891 Unspecified atrial fibrillation: Secondary | ICD-10-CM | POA: Diagnosis not present

## 2023-10-04 MED ORDER — DILTIAZEM HCL ER COATED BEADS 240 MG PO CP24: 240.0000 mg | ORAL_CAPSULE | Freq: Every day | ORAL | 3 refills | Status: AC

## 2023-10-04 NOTE — Patient Instructions (Addendum)
 Medication Instructions:   START TAKING:  DILTIAZEM  240 MG ONCE A DAY   STOP TAKING AND REMOVE THIS MEDICATION FROM YOUR MEDICATION LIST:  AMIODARONE     *If you need a refill on your cardiac medications before your next appointment, please call your pharmacy*   Lab Work:  PLEASE GO DOWN STAIRS  [facility]  FIRST FLOOR   ( GET OFF ELEVATORS WALK TOWARDS WAITING AREA LAB LOCATED BY PHARMACY): LFT AND TSH TODAY     If you have labs (blood work) drawn today and your tests are completely normal, you will receive your results only by: MyChart Message (if you have MyChart) OR A paper copy in the mail If you have any lab test that is abnormal or we need to change your treatment, we will call you to review the results.    Testing/Procedures: NONE ORDERED  TODAY    Follow-Up: At [facility], you and your health needs are our priority.  As part of our continuing mission to provide you with exceptional heart care, our providers are all part of one team.  This team includes your primary Cardiologist (physician) and Advanced Practice Providers or APPs (Physician Assistants and Nurse Practitioners) who all work together to provide you with the care you need, when you need it.  Your next appointment:   4 -5 month(s)  Provider:   Charlies Arthur, PA-C ( CONTACT  CASSIE HALL/ ANGELINE HAMMER FOR EP SCHEDULING ISSUES )    We recommend signing up for the patient portal called MyChart.  Sign up information is provided on this After Visit Summary.  MyChart is used to connect with patients for Virtual Visits (Telemedicine).  Patients are able to view lab/test results, encounter notes, upcoming appointments, etc.  Non-urgent messages can be sent to your provider as well.   To learn more about what you can do with MyChart, go to [email or web address].    Other Instructions

## 2023-10-05 ENCOUNTER — Ambulatory Visit: Payer: Self-pay | Admitting: Physician Assistant

## 2023-10-05 DIAGNOSIS — I4811 Longstanding persistent atrial fibrillation: Secondary | ICD-10-CM

## 2023-10-05 LAB — HEPATIC FUNCTION PANEL
ALT: 64 IU/L — ABNORMAL HIGH (ref 0–44)
AST: 60 IU/L — ABNORMAL HIGH (ref 0–40)
Albumin: 4.2 g/dL (ref 3.8–4.8)
Alkaline Phosphatase: 68 IU/L (ref 44–121)
Bilirubin Total: 0.3 mg/dL (ref 0.0–1.2)
Bilirubin, Direct: 0.18 mg/dL (ref 0.00–0.40)
Total Protein: 7.3 g/dL (ref 6.0–8.5)

## 2023-10-05 LAB — TSH: TSH: 8.37 u[IU]/mL — ABNORMAL HIGH (ref 0.450–4.500)

## 2023-11-10 ENCOUNTER — Encounter: Payer: Self-pay | Admitting: Internal Medicine

## 2023-11-10 DIAGNOSIS — Z23 Encounter for immunization: Secondary | ICD-10-CM | POA: Diagnosis not present

## 2023-11-10 MED ORDER — COVID-19 MRNA VAC-TRIS(PFIZER) 30 MCG/0.3ML IM SUSY: 0.3000 mL | PREFILLED_SYRINGE | Freq: Once | INTRAMUSCULAR | 0 refills | Status: AC

## 2023-11-10 NOTE — Telephone Encounter (Signed)
Pharmacy is on file

## 2023-11-15 DIAGNOSIS — H40013 Open angle with borderline findings, low risk, bilateral: Secondary | ICD-10-CM | POA: Diagnosis not present

## 2023-11-15 DIAGNOSIS — H35363 Drusen (degenerative) of macula, bilateral: Secondary | ICD-10-CM | POA: Diagnosis not present

## 2023-11-15 DIAGNOSIS — H25811 Combined forms of age-related cataract, right eye: Secondary | ICD-10-CM | POA: Diagnosis not present

## 2023-11-15 DIAGNOSIS — R7303 Prediabetes: Secondary | ICD-10-CM | POA: Diagnosis not present

## 2023-11-30 DIAGNOSIS — Z23 Encounter for immunization: Secondary | ICD-10-CM | POA: Diagnosis not present

## 2023-12-20 ENCOUNTER — Other Ambulatory Visit: Payer: Self-pay | Admitting: Cardiology

## 2023-12-21 NOTE — Telephone Encounter (Signed)
 Prescription refill request for Pradaxa  received.  Indication:afib Last office visit:8/25 Weight:130  kg Age:78 Scr:0.96  4/25 CrCl:120.37  ml/min  Prescription refills

## 2023-12-24 ENCOUNTER — Other Ambulatory Visit: Payer: Self-pay | Admitting: Cardiology

## 2024-01-29 ENCOUNTER — Ambulatory Visit (INDEPENDENT_AMBULATORY_CARE_PROVIDER_SITE_OTHER): Admitting: Otolaryngology

## 2024-01-30 ENCOUNTER — Ambulatory Visit: Admitting: Internal Medicine

## 2024-02-05 NOTE — Progress Notes (Deleted)
 Cardiology Office Note Date:  02/05/2024  Patient ID:  Brian, Myers March 24, 1945, MRN [id number] PCP:  Rollene Almarie LABOR, MD  Cardiologist:  Dr. Claudene Electrophysiologist: Dr. Kelsie (inactive) >> Dr. Cindie    Chief Complaint:   90 day post ablation visit  History of Present Illness: Brian Myers is a 78 y.o. male with history of  OSA w/CPAP, HLD, DM, morbid obesity AFib  Saw Dr. Kelsie 02/05/21: diltiazem  dose increased, discussed ablation and Tikosyn, pt preferred to continue rate control strategy  I saw him 06/06/22 In the last year he feels like he has become more symptomatic with his AFib, HR elevates quicker with less exertion, gets fatigued easier. No CP Some awareness of his heart beat No near syncope or syncope. No bleeding or signs of bleeding Given: Declining exertional capacity, higher HRs with less exertion. He would like to see if we could consider rhythm control. Discussed harder to be successful since he has been in AF a couple years Planned for an echo, 3 day monitor for HR control and DCCV to see if we could gain any SR  TTE w/preserved LVEF , mild asymmetric septal hypertrophy, no significant VHD  Pesistent atrial fibrillation.   Range 42 bpm to 158 bpm, average rate 82 bpm.   One wide complex run of 10 beats (average 127 bpm) Persistent, rate controlled atrial fibrillation.  DCCV planned but cancelled 2/2 family member illness.  I saw him May 2024 >  Planned to refer to Dr. Cindie > consider rhyhm control options  He saw Dr. Cindie, 09/08/22, progressively more symptomatic with his Afib (perhaps multifactorial with weight/deconditioning) Started on amiodarone  > DCCV to see if we could obtain SR Not felt tikosyn candidate with longer QT  DCCV successful 11/11/22 > ERAF DCCV 01/30/23 successful  AFib ablation 07/06/23  Saw the Afib clinic team 08/03/23, doing well, no symptoms of AFib, no procedural concerns Discussed reduced HR  excursion with exertion  I saw him 10/04/23 He is accompanied by his wife About 3days after his ablation felt about 30seconds of what felt AFib, in about mid-June perhaps an hour. None otherwise He does feel like his HR is blunted, doesn't seem to come up much with excursion No near syncope or syncope No CP, SOB No bleeding or signs of bleeding Quite happy with his Ablation results so far EKG was SB, 1st degree AVblock Amiodarone  stopped, dilt dose reduced Planned to have him back a few months off amio  TODAY  *** symptoms *** pradaxa  dose, bleeding, labs *** amio labs Aug slightly elevated >> rec repeat but not done *** repeat LFTs and TSH  Afib Hx Diagnosed 20+ years ago  AAD Hx Flecainide  started 2012, is current > stopped May 22, with recurrent AF Rate control strategy at pt preference Dec 2022 Rhythm control planned July 2024 > with increased symptoms Amiodarone  started July 2024 >> stopped 10/04/23 post ablation 07/06/23: AFib ablation  Past Medical History:  Diagnosis Date   Arthritis    KNEES   Asthma    Bladder problem    enlarged bladder   BPH (benign prostatic hypertrophy)    DIFFICULTY GETTING STREAM STARTED   Eczema    HANDS   GERD (gastroesophageal reflux disease)    H/O hiatal hernia    Hyperlipidemia    Hyperlipidemia    DOES NOT TOLERATE ANY OF THE STATINS   Persistent atrial fibrillation (HCC)    Dr Brian Myers   Pneumonia  LAST EPISODE JAN 2015   Right knee meniscal tear    PAIN RT KNEE   Sleep apnea    with cpap    Past Surgical History:  Procedure Laterality Date   ATRIAL FIBRILLATION ABLATION N/A 07/06/2023   Procedure: ATRIAL FIBRILLATION ABLATION;  Surgeon: Brian Ole DASEN, MD;  Location: [facility];  Service: Cardiovascular;  Laterality: N/A;   CARDIOVERSION N/A 06/10/2020   Procedure: CARDIOVERSION;  Surgeon: Brian Soyla LABOR, MD;  Location: [facility] ENDOSCOPY;  Service: Cardiovascular;  Laterality: N/A;   CARDIOVERSION N/A 11/11/2022    Procedure: CARDIOVERSION;  Surgeon: Brian Headland, MD;  Location: [facility];  Service: Cardiovascular;  Laterality: N/A;   CARDIOVERSION N/A 01/30/2023   Procedure: CARDIOVERSION;  Surgeon: Brian Headland, MD;  Location: [facility];  Service: Cardiovascular;  Laterality: N/A;   CERVICAL FUSION  1991   KNEE ARTHROSCOPY Right 04/17/2013   Procedure: ARTHROSCOPY RIGHT KNEE WITH MEDIAL AND LATERAL DEBRIDEMENT AND CONDRAPLASTY;  Surgeon: Brian LULLA Moan, MD;  Location: [facility] ORS;  Service: Orthopedics;  Laterality: Right;   LEFT KNEE ARTHROSCPY  2009   TEE WITHOUT CARDIOVERSION N/A 06/10/2020   Procedure: TRANSESOPHAGEAL ECHOCARDIOGRAM (TEE);  Surgeon: Brian Soyla LABOR, MD;  Location: [facility] ENDOSCOPY;  Service: Cardiovascular;  Laterality: N/A;    Current Outpatient Medications  Medication Sig Dispense Refill   albuterol  (VENTOLIN  HFA) 108 (90 Base) MCG/ACT inhaler Inhale 2 puffs into the lungs every 4 (four) hours as needed for wheezing or shortness of breath. 1 each 3   Cholecalciferol (VITAMIN D -3) 125 MCG (5000 UT) TABS Take 5,000 Units by mouth daily at 6 (six) AM.     cyanocobalamin  (VITAMIN B12) 1000 MCG tablet Take 1,000 mcg by mouth in the morning.     dabigatran  (PRADAXA ) 150 MG CAPS capsule TAKE 1 CAPSULE BY MOUTH TWICE A DAY 180 capsule 1   diltiazem  (CARDIZEM  CD) 240 MG 24 hr capsule Take 1 capsule (240 mg total) by mouth daily. 90 capsule 3   esomeprazole (NEXIUM) 20 MG capsule Take 20 mg by mouth 2 (two) times daily.  1   Ferrous Sulfate (IRON PO) Take 180 mg by mouth every other day.     finasteride (PROSCAR) 5 MG tablet Take 5 mg by mouth in the morning.     fluticasone (FLONASE) 50 MCG/ACT nasal spray Place 1 spray into both nostrils 2 (two) times a day.     losartan  (COZAAR ) 50 MG tablet TAKE 1 TABLET BY MOUTH EVERY DAY 90 tablet 3   methylcellulose (ARTIFICIAL TEARS) 1 % ophthalmic solution Place 2 drops into both eyes 2 (two) times daily.     pantoprazole   (PROTONIX ) 40 MG tablet Take 1 tablet (40 mg total) by mouth daily. Short term medication post procedure, does not need refills.  Pt should hold nexium until finished with protonix  and then resume. 45 tablet 0   SYMBICORT  160-4.5 MCG/ACT inhaler TAKE 2 PUFFS BY MOUTH TWICE A DAY 30.6 each 1   No current facility-administered medications for this visit.    Allergies:   Penicillins, Sulfa drugs cross reactors, and Statins   Social History:  The patient  reports that he has never smoked. He has never used smokeless tobacco. He reports current alcohol use of about 28.0 standard drinks of alcohol per week. He reports that he does not use drugs.   Family History:  The patient's family history includes Arrhythmia in his mother; Hypertension in his father; Mitral valve prolapse in his mother.  ROS:  Please see the history of present illness.    All other systems are reviewed and otherwise negative.   PHYSICAL EXAM:  VS:  There were no vitals taken for this visit. BMI: There is no height or weight on file to calculate BMI. Well nourished, well developed, in no acute distress HEENT: normocephalic, atraumatic Neck: no JVD, carotid bruits or masses Cardiac: *** RRR; no significant murmurs, no rubs, or gallops Lungs: *** CTA b/l, no wheezing, rhonchi or rales Abd: soft, nontender, obese MS: no deformity or atrophy Ext *** trace-1+ (he reports his baseline) Skin: warm and dry, no rash Neuro:  No gross deficits appreciated Psych: euthymic mood, full affect   EKG:  not done today  07/06/23: EPS/ablation CONCLUSIONS: 1. Successful PVI 2. Successful ablation/isolation of the posterior wall 3. Intracardiac echo reveals trivial pericardial effusion, normal LA architecture 4. No early apparent complications. 5. Colchicine  0.6mg  PO BID x 5 days 6. Protonix  40mg  PO daily x 45 days  07/04/22: TTE 1. Left ventricular ejection fraction, by estimation, is 60 to 65%. The  left ventricle has normal  function. The left ventricle has no regional  wall motion abnormalities. There is mild asymmetric left ventricular  hypertrophy of the basal-septal segment.  Left ventricular diastolic parameters are indeterminate.   2. Right ventricular systolic function is normal. The right ventricular  size is normal.   3. Left atrial size was mildly dilated.   4. Right atrial size was mildly dilated.   5. The mitral valve is grossly normal. Trivial mitral valve  regurgitation.   6. The aortic valve is tricuspid. There is mild calcification of the  aortic valve. There is mild thickening of the aortic valve. Aortic valve  regurgitation is not visualized. Aortic valve sclerosis/calcification is  present, without any evidence of  aortic stenosis.   7. Aortic dilatation noted. There is mild dilatation of the aortic root,  measuring 41 mm.   Comparison(s): EF 60%, modearte asymmetrical septal hypertrophy basal and  septal , AOR 42mm.     02/01/21: TTE 1. Left ventricular ejection fraction, by estimation, is 60 to 65%. The  left ventricle has normal function. The left ventricle has no regional  wall motion abnormalities. There is moderate asymmetric left ventricular  hypertrophy of the basal and septal  segments. Left ventricular diastolic parameters were normal.   2. Right ventricular systolic function is normal. The right ventricular  size is normal.   3. Left atrial size was moderately dilated.   4. No sub costal windows.   5. The mitral valve is abnormal. Trivial mitral valve regurgitation. No  evidence of mitral stenosis.   6. The aortic valve is tricuspid. There is mild calcification of the  aortic valve. There is mild thickening of the aortic valve. Aortic valve  regurgitation is not visualized. Aortic valve sclerosis is present, with  no evidence of aortic valve stenosis.   7. Aortic dilatation noted. There is moderate dilatation of the aortic  root, measuring 42 mm.   8. The inferior  vena cava is normal in size with greater than 50%  respiratory variability, suggesting right atrial pressure of 3 mmHg.    08/30/2016: TTE Study Conclusions  - Left ventricle: The cavity size was normal. Wall thickness was    normal. Systolic function was normal. The estimated ejection    fraction was in the range of 60% to 65%. Wall motion was normal;    there were no regional wall motion abnormalities. Features are  consistent with a pseudonormal left ventricular filling pattern,    with concomitant abnormal relaxation and increased filling    pressure (grade 2 diastolic dysfunction).  - Right atrium: The atrium was mildly dilated.    Recent Labs: 06/19/2023: BUN 19; Creatinine, Ser 0.96; Hemoglobin 11.9; Platelets 263; Potassium 4.4; Sodium 145 10/04/2023: ALT 64; TSH 8.370  No results found for requested labs within last 365 days.   CrCl cannot be calculated (Patient's most recent lab result is older than the maximum 21 days allowed.).   Wt Readings from Last 3 Encounters:  10/04/23 286 lb 11.2 oz (130 kg)  08/03/23 283 lb 12.8 oz (128.7 kg)  07/17/23 281 lb (127.5 kg)     Other studies reviewed: Additional studies/records reviewed today include: summarized above  ASSESSMENT AND PLAN:  1. Longstanding persistent AFib     CHA2DS2Vasc is 3, on pradaxa , *** appropriately dosed     ***  2. HTN     *** Looks ok  3. HLD     Not addressed today  4. Secondary hypercoagulable state    Disposition: back in ***, sooner if needed     Current medicines are reviewed at length with the patient today.  The patient did not have any concerns regarding medicines.  Bonney Charlies Arthur, PA-C 02/05/2024 9:58 AM     [facility] [address] [place] [address] [phone] (office)  [phone] (fax)

## 2024-02-06 ENCOUNTER — Ambulatory Visit: Admitting: Physician Assistant

## 2024-02-12 ENCOUNTER — Ambulatory Visit: Admitting: Internal Medicine

## 2024-02-12 VITALS — BP 128/70 | HR 71 | Temp 98.7°F | Ht 71.0 in | Wt 286.2 lb

## 2024-02-12 DIAGNOSIS — D500 Iron deficiency anemia secondary to blood loss (chronic): Secondary | ICD-10-CM

## 2024-02-12 DIAGNOSIS — E538 Deficiency of other specified B group vitamins: Secondary | ICD-10-CM

## 2024-02-12 DIAGNOSIS — E118 Type 2 diabetes mellitus with unspecified complications: Secondary | ICD-10-CM

## 2024-02-12 DIAGNOSIS — J0110 Acute frontal sinusitis, unspecified: Secondary | ICD-10-CM

## 2024-02-12 DIAGNOSIS — G320 Subacute combined degeneration of spinal cord in diseases classified elsewhere: Secondary | ICD-10-CM

## 2024-02-12 DIAGNOSIS — E1169 Type 2 diabetes mellitus with other specified complication: Secondary | ICD-10-CM | POA: Diagnosis not present

## 2024-02-12 DIAGNOSIS — E785 Hyperlipidemia, unspecified: Secondary | ICD-10-CM

## 2024-02-12 LAB — POCT GLYCOSYLATED HEMOGLOBIN (HGB A1C): Hemoglobin A1C: 5.9 % — AB (ref 4.0–5.6)

## 2024-02-12 MED ORDER — AZITHROMYCIN 250 MG PO TABS: ORAL_TABLET | ORAL | 0 refills | Status: AC

## 2024-02-12 MED ORDER — PROMETHAZINE-DM 6.25-15 MG/5ML PO SYRP: 5.0000 mL | ORAL_SOLUTION | Freq: Four times a day (QID) | ORAL | 0 refills | Status: AC | PRN

## 2024-02-12 MED ORDER — PREDNISONE 20 MG PO TABS: 40.0000 mg | ORAL_TABLET | Freq: Every day | ORAL | 0 refills | Status: AC

## 2024-02-12 NOTE — Patient Instructions (Signed)
 We have sent in the prednisone  to take 2 pills daily for 5 days.  We have sent in the azithromycin  to take 2 pills on day 1, then take 1 pill daily for days 2-5.   We have sent in the cough medicine to use as needed it is promethazine /dm.

## 2024-02-12 NOTE — Progress Notes (Unsigned)
° °  Subjective:   Patient ID: Brian Myers, male    DOB: Dec 14, 1945, 78 y.o.   MRN: [id number]  Discussed the use of AI scribe software for clinical note transcription with the patient, who gave verbal consent to proceed.  History of Present Illness Brian Myers is a 78 year old male who presents with respiratory symptoms including wheezing and cough.  He has been experiencing respiratory symptoms for the past eleven days, including a persistent cough, nasal drainage, and significant wheezing. The cough is hard, and his nose is constantly running, with drainage down his throat. The wheezing is severe, and he gets out of breath more easily.  He uses his albuterol  inhaler three times a day to manage the wheezing, but it only provides relief for about one to two hours. The inhaler does not significantly help with the cough.  He feels worse today compared to yesterday, with no significant improvement in symptoms over the past week and a half. He has not consistently checked for fever but recalls feeling cool one day and possibly having a fever the day before his wife did.  He reports significant sinus pressure, particularly in his right ear, which is so congested that he cannot hear well even with his hearing aids. Both ears are affected, with the right ear being worse.  He describes soreness in his chest muscles and notes that the cough sounds deep.  No shaking chills.  Review of Systems  Objective:  Physical Exam  Vitals:   02/12/24 1535  BP: 128/70  Pulse: 71  Temp: 98.7 F (37.1 C)  TempSrc: Oral  SpO2: 96%  Weight: 286 lb 3.2 oz (129.8 kg)  Height: 5' 11 (1.803 m)    Assessment and Plan Assessment & Plan Acute bronchitis   He presents with symptoms of productive cough, nasal drainage, wheezing, and dyspnea for 11 days, without fever or chills. Increased use of albuterol  provides temporary relief, suggesting possible bronchitis or lung infection. Continue  albuterol  inhaler as needed.  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 Ordered cholesterol test to monitor cardiovascular risk.

## 2024-02-13 DIAGNOSIS — J019 Acute sinusitis, unspecified: Secondary | ICD-10-CM | POA: Insufficient documentation

## 2024-02-13 NOTE — Assessment & Plan Note (Signed)
 A1c remains stable at 5.9 checked today with POC. Ordered lipid panel, CMP, UACR, indicating good glycemic control.

## 2024-02-13 NOTE — Assessment & Plan Note (Signed)
 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

## 2024-02-13 NOTE — Assessment & Plan Note (Signed)
 Checking lipid panel and adjust as needed.

## 2024-02-13 NOTE — Assessment & Plan Note (Signed)
 Checking CBC for stability.

## 2024-02-13 NOTE — Assessment & Plan Note (Signed)
 Rx prednisone  and azithromycin . Rx promethazine /dm cough syrup for cough.

## 2024-03-20 ENCOUNTER — Ambulatory Visit (INDEPENDENT_AMBULATORY_CARE_PROVIDER_SITE_OTHER): Admitting: Otolaryngology

## 2024-03-20 ENCOUNTER — Encounter (INDEPENDENT_AMBULATORY_CARE_PROVIDER_SITE_OTHER): Payer: Self-pay | Admitting: Otolaryngology

## 2024-03-20 VITALS — BP 135/77 | HR 62

## 2024-03-20 DIAGNOSIS — H903 Sensorineural hearing loss, bilateral: Secondary | ICD-10-CM

## 2024-03-20 DIAGNOSIS — H6123 Impacted cerumen, bilateral: Secondary | ICD-10-CM

## 2024-03-20 NOTE — Progress Notes (Unsigned)
 Patient ID: Brian Myers, male   DOB: [birth year], 79 y.o.   MRN: [id number]  Follow-up: Progressive hearing loss  HPI: The patient is a 79 year old male who returns today for his follow-up evaluation.  The patient has a history of bilateral sensorineural hearing loss.  He was fitted with bilateral hearing aids at [facility].  The patient returns today reporting progressive worsening of his hearing.  Currently he denies any otalgia, otorrhea, or vertigo.  He also has a history of recurrent cerumen impaction.  Exam: General: Communicates without difficulty, well nourished, no acute distress. Head: Normocephalic, no evidence injury, no tenderness, facial buttresses intact without stepoff. Face/sinus: No tenderness to palpation and percussion. Facial movement is normal and symmetric. Eyes: PERRL, EOMI. No scleral icterus, conjunctivae clear. Neuro: CN II exam reveals vision grossly intact.  No nystagmus at any point of gaze. Ears: Auricles well formed without lesions.  Bilateral cerumen impaction.  Nose: External evaluation reveals normal support and skin without lesions.  Dorsum is intact.  Anterior rhinoscopy reveals congested mucosa over anterior aspect of inferior turbinates and intact septum.  No purulence noted. Oral:  Oral cavity and oropharynx are intact, symmetric, without erythema or edema.  Mucosa is moist without lesions. Neck: Full range of motion without pain.  There is no significant lymphadenopathy.  No masses palpable.  Thyroid  bed within normal limits to palpation.  Parotid glands and submandibular glands equal bilaterally without mass.  Trachea is midline. Neuro:  CN 2-12 grossly intact.   Procedure: Bilateral cerumen disimpaction Anesthesia: None Description: Under the operating microscope, the cerumen is carefully removed with a combination of cerumen currette, alligator forceps, and suction catheters.  After the cerumen is removed, the TMs are noted to be normal.  No mass, erythema, or  lesions. The patient tolerated the procedure well.    Assessment: 1.  Bilateral cerumen impaction.  After the disimpaction procedure, both tympanic membranes and middle ear spaces are noted to be normal.  2.  Bilateral progressive sensorineural hearing loss.  His hearing loss was previously noted to be worse on the left side at high frequencies.  Plan: 1.  Otomicroscopy with bilateral cerumen disimpaction.  2.  The physical exam findings are reviewed with the patient.  3.  The small possibility of a retrocochlear lesion causing his asymmetric hearing loss is discussed.  The options of conservative observation versus MRI scan are reviewed.  The patient would like to proceed with conservative observation. 4.  Continue the use of his hearing aids. The patient will have his hearing retested at [facility]. 5.  The patient will return for reevaluation in 6 months, sooner if needed.  He is instructed to bring his hearing test results with him at the next visit.

## 2024-04-03 ENCOUNTER — Ambulatory Visit: Admitting: Physician Assistant

## 2024-05-17 ENCOUNTER — Ambulatory Visit: Admitting: Physician Assistant

## 2024-05-20 ENCOUNTER — Ambulatory Visit: Admitting: Physician Assistant

## 2024-08-12 ENCOUNTER — Ambulatory Visit: Admitting: Internal Medicine

## 2024-09-18 ENCOUNTER — Ambulatory Visit (INDEPENDENT_AMBULATORY_CARE_PROVIDER_SITE_OTHER): Admitting: Otolaryngology
